# Patient Record
Sex: Female | Born: 1955
Health system: Southern US, Community
[De-identification: ages and names within clinical notes are randomized; demographics above are authoritative.]

## PROBLEM LIST (undated history)

## (undated) DIAGNOSIS — K219 Gastro-esophageal reflux disease without esophagitis: Secondary | ICD-10-CM

## (undated) DIAGNOSIS — M797 Fibromyalgia: Secondary | ICD-10-CM

## (undated) DIAGNOSIS — E119 Type 2 diabetes mellitus without complications: Secondary | ICD-10-CM

## (undated) DIAGNOSIS — M1990 Unspecified osteoarthritis, unspecified site: Secondary | ICD-10-CM

## (undated) DIAGNOSIS — F419 Anxiety disorder, unspecified: Secondary | ICD-10-CM

## (undated) DIAGNOSIS — G43909 Migraine, unspecified, not intractable, without status migrainosus: Secondary | ICD-10-CM

## (undated) HISTORY — PX: TUBAL LIGATION: SHX77

## (undated) HISTORY — PX: GALLBLADDER SURGERY: SHX652

## (undated) HISTORY — DX: Fibromyalgia: M79.7

## (undated) HISTORY — DX: Migraine, unspecified, not intractable, without status migrainosus: G43.909

## (undated) HISTORY — DX: Anxiety disorder, unspecified: F41.9

## (undated) HISTORY — DX: Type 2 diabetes mellitus without complications: E11.9

---

## 2011-12-14 DIAGNOSIS — Z90710 Acquired absence of both cervix and uterus: Secondary | ICD-10-CM | POA: Insufficient documentation

## 2011-12-14 DIAGNOSIS — D126 Benign neoplasm of colon, unspecified: Secondary | ICD-10-CM | POA: Insufficient documentation

## 2011-12-14 HISTORY — DX: Benign neoplasm of colon, unspecified: D12.6

## 2011-12-14 HISTORY — DX: Acquired absence of both cervix and uterus: Z90.710

## 2016-11-26 DIAGNOSIS — R112 Nausea with vomiting, unspecified: Secondary | ICD-10-CM | POA: Diagnosis not present

## 2016-11-26 DIAGNOSIS — H8110 Benign paroxysmal vertigo, unspecified ear: Secondary | ICD-10-CM | POA: Diagnosis not present

## 2017-10-23 ENCOUNTER — Inpatient Hospital Stay (HOSPITAL_COMMUNITY): Payer: BLUE CROSS/BLUE SHIELD

## 2017-10-23 ENCOUNTER — Emergency Department (HOSPITAL_COMMUNITY): Payer: BLUE CROSS/BLUE SHIELD

## 2017-10-23 ENCOUNTER — Inpatient Hospital Stay (HOSPITAL_COMMUNITY)
Admission: EM | Admit: 2017-10-23 | Discharge: 2017-10-25 | DRG: 419 | Disposition: A | Discharge status: Home / Self Care | Payer: BLUE CROSS/BLUE SHIELD | Attending: Internal Medicine | Admitting: Internal Medicine

## 2017-10-23 ENCOUNTER — Encounter (HOSPITAL_COMMUNITY): Payer: Self-pay | Admitting: Emergency Medicine

## 2017-10-23 ENCOUNTER — Other Ambulatory Visit: Payer: Self-pay

## 2017-10-23 DIAGNOSIS — R109 Unspecified abdominal pain: Secondary | ICD-10-CM

## 2017-10-23 DIAGNOSIS — R9431 Abnormal electrocardiogram [ECG] [EKG]: Secondary | ICD-10-CM | POA: Diagnosis not present

## 2017-10-23 DIAGNOSIS — J9811 Atelectasis: Secondary | ICD-10-CM

## 2017-10-23 DIAGNOSIS — F17210 Nicotine dependence, cigarettes, uncomplicated: Secondary | ICD-10-CM | POA: Diagnosis not present

## 2017-10-23 DIAGNOSIS — K810 Acute cholecystitis: Principal | ICD-10-CM

## 2017-10-23 DIAGNOSIS — K819 Cholecystitis, unspecified: Secondary | ICD-10-CM | POA: Diagnosis present

## 2017-10-23 DIAGNOSIS — Z90710 Acquired absence of both cervix and uterus: Secondary | ICD-10-CM

## 2017-10-23 DIAGNOSIS — R1011 Right upper quadrant pain: Secondary | ICD-10-CM | POA: Diagnosis not present

## 2017-10-23 DIAGNOSIS — R748 Abnormal levels of other serum enzymes: Secondary | ICD-10-CM | POA: Diagnosis not present

## 2017-10-23 DIAGNOSIS — F17200 Nicotine dependence, unspecified, uncomplicated: Secondary | ICD-10-CM | POA: Diagnosis present

## 2017-10-23 DIAGNOSIS — K811 Chronic cholecystitis: Secondary | ICD-10-CM | POA: Diagnosis not present

## 2017-10-23 DIAGNOSIS — R932 Abnormal findings on diagnostic imaging of liver and biliary tract: Secondary | ICD-10-CM | POA: Diagnosis not present

## 2017-10-23 DIAGNOSIS — Z79899 Other long term (current) drug therapy: Secondary | ICD-10-CM

## 2017-10-23 DIAGNOSIS — Z978 Presence of other specified devices: Secondary | ICD-10-CM | POA: Diagnosis not present

## 2017-10-23 DIAGNOSIS — Z9049 Acquired absence of other specified parts of digestive tract: Secondary | ICD-10-CM | POA: Diagnosis not present

## 2017-10-23 DIAGNOSIS — K219 Gastro-esophageal reflux disease without esophagitis: Secondary | ICD-10-CM

## 2017-10-23 DIAGNOSIS — R161 Splenomegaly, not elsewhere classified: Secondary | ICD-10-CM | POA: Diagnosis not present

## 2017-10-23 DIAGNOSIS — K828 Other specified diseases of gallbladder: Secondary | ICD-10-CM | POA: Diagnosis not present

## 2017-10-23 DIAGNOSIS — K5730 Diverticulosis of large intestine without perforation or abscess without bleeding: Secondary | ICD-10-CM | POA: Diagnosis not present

## 2017-10-23 HISTORY — DX: Gastro-esophageal reflux disease without esophagitis: K21.9

## 2017-10-23 HISTORY — DX: Cholecystitis, unspecified: K81.9

## 2017-10-23 LAB — CBC WITH DIFFERENTIAL/PLATELET
Abs Immature Granulocytes: 0 10*3/uL (ref 0.0–0.1)
Basophils Absolute: 0.1 10*3/uL (ref 0.0–0.1)
Basophils Relative: 1 %
Eosinophils Absolute: 0.1 10*3/uL (ref 0.0–0.7)
Eosinophils Relative: 2 %
HCT: 46 % (ref 36.0–46.0)
Hemoglobin: 14.9 g/dL (ref 12.0–15.0)
Immature Granulocytes: 0 %
Lymphocytes Relative: 25 %
Lymphs Abs: 1.8 10*3/uL (ref 0.7–4.0)
MCH: 29.6 pg (ref 26.0–34.0)
MCHC: 32.4 g/dL (ref 30.0–36.0)
MCV: 91.5 fL (ref 78.0–100.0)
Monocytes Absolute: 0.6 10*3/uL (ref 0.1–1.0)
Monocytes Relative: 8 %
Neutro Abs: 4.7 10*3/uL (ref 1.7–7.7)
Neutrophils Relative %: 64 %
Platelets: 217 10*3/uL (ref 150–400)
RBC: 5.03 MIL/uL (ref 3.87–5.11)
RDW: 13 % (ref 11.5–15.5)
WBC: 7.3 10*3/uL (ref 4.0–10.5)

## 2017-10-23 LAB — URINALYSIS, ROUTINE W REFLEX MICROSCOPIC
Bilirubin Urine: NEGATIVE
Glucose, UA: NEGATIVE mg/dL
Hgb urine dipstick: NEGATIVE
Ketones, ur: NEGATIVE mg/dL
Leukocytes, UA: NEGATIVE
Nitrite: NEGATIVE
Protein, ur: NEGATIVE mg/dL
Specific Gravity, Urine: 1.02 (ref 1.005–1.030)
pH: 5 (ref 5.0–8.0)

## 2017-10-23 LAB — COMPREHENSIVE METABOLIC PANEL
ALT: 18 U/L (ref 0–44)
AST: 20 U/L (ref 15–41)
Albumin: 3.7 g/dL (ref 3.5–5.0)
Alkaline Phosphatase: 147 U/L — ABNORMAL HIGH (ref 38–126)
Anion gap: 12 (ref 5–15)
BUN: 11 mg/dL (ref 6–20)
CO2: 23 mmol/L (ref 22–32)
Calcium: 9.2 mg/dL (ref 8.9–10.3)
Chloride: 106 mmol/L (ref 98–111)
Creatinine, Ser: 0.91 mg/dL (ref 0.44–1.00)
GFR calc Af Amer: >60 mL/min (ref >60)
GFR calc non Af Amer: >60 mL/min (ref >60)
Glucose, Bld: 142 mg/dL — ABNORMAL HIGH (ref 70–99)
Potassium: 3.7 mmol/L (ref 3.5–5.1)
Sodium: 141 mmol/L (ref 135–145)
Total Bilirubin: 0.8 mg/dL (ref 0.3–1.2)
Total Protein: 7.2 g/dL (ref 6.5–8.1)

## 2017-10-23 LAB — SURGICAL PCR SCREEN
MRSA, PCR: NEGATIVE
Staphylococcus aureus: NEGATIVE

## 2017-10-23 LAB — LIPASE, BLOOD: Lipase: 69 U/L — ABNORMAL HIGH (ref 11–51)

## 2017-10-23 MED ORDER — ONDANSETRON HCL 4 MG PO TABS: 4.0000 mg | ORAL_TABLET | Freq: Four times a day (QID) | ORAL | Status: DC | PRN

## 2017-10-23 MED ORDER — MORPHINE SULFATE (PF) 4 MG/ML IV SOLN
4.0000 mg | Freq: Once | INTRAVENOUS | Status: AC
  Administered 2017-10-23: 4 mg via INTRAVENOUS
  Filled 2017-10-23: qty 1

## 2017-10-23 MED ORDER — SODIUM CHLORIDE 0.9 % IV SOLN
2.0000 g | INTRAVENOUS | Status: DC
  Administered 2017-10-23 – 2017-10-24 (×2): 2 g via INTRAVENOUS
  Filled 2017-10-23 (×3): qty 20

## 2017-10-23 MED ORDER — GADOBUTROL 1 MMOL/ML IV SOLN
9.0000 mL | Freq: Once | INTRAVENOUS | Status: AC | PRN
  Administered 2017-10-23: 9 mL via INTRAVENOUS

## 2017-10-23 MED ORDER — ENOXAPARIN SODIUM 40 MG/0.4ML ~~LOC~~ SOLN
40.0000 mg | SUBCUTANEOUS | Status: DC
  Administered 2017-10-23 – 2017-10-25 (×2): 40 mg via SUBCUTANEOUS
  Filled 2017-10-23 (×2): qty 0.4

## 2017-10-23 MED ORDER — LACTATED RINGERS IV SOLN
INTRAVENOUS | Status: DC
  Administered 2017-10-23 – 2017-10-24 (×3): via INTRAVENOUS

## 2017-10-23 MED ORDER — ACETAMINOPHEN 325 MG PO TABS
650.0000 mg | ORAL_TABLET | Freq: Four times a day (QID) | ORAL | Status: DC | PRN
  Administered 2017-10-24 – 2017-10-25 (×3): 650 mg via ORAL
  Filled 2017-10-23 (×3): qty 2

## 2017-10-23 MED ORDER — MORPHINE SULFATE (PF) 2 MG/ML IV SOLN
2.0000 mg | Freq: Once | INTRAVENOUS | Status: AC
  Administered 2017-10-23: 2 mg via INTRAVENOUS
  Filled 2017-10-23: qty 1

## 2017-10-23 MED ORDER — METRONIDAZOLE IN NACL 5-0.79 MG/ML-% IV SOLN
500.0000 mg | Freq: Three times a day (TID) | INTRAVENOUS | Status: DC
  Administered 2017-10-23 – 2017-10-25 (×6): 500 mg via INTRAVENOUS
  Filled 2017-10-23 (×7): qty 100

## 2017-10-23 MED ORDER — HYDROMORPHONE HCL 1 MG/ML IJ SOLN
0.5000 mg | INTRAMUSCULAR | Status: DC | PRN
  Administered 2017-10-23 – 2017-10-24 (×4): 0.5 mg via INTRAVENOUS
  Filled 2017-10-23 (×4): qty 1

## 2017-10-23 MED ORDER — SODIUM CHLORIDE 0.9 % IV SOLN: 2.0000 g | Freq: Once | INTRAVENOUS | Status: DC

## 2017-10-23 MED ORDER — ACETAMINOPHEN 650 MG RE SUPP: 650.0000 mg | Freq: Four times a day (QID) | RECTAL | Status: DC | PRN

## 2017-10-23 MED ORDER — ONDANSETRON HCL 4 MG/2ML IJ SOLN
4.0000 mg | Freq: Once | INTRAMUSCULAR | Status: AC
  Administered 2017-10-23: 4 mg via INTRAVENOUS
  Filled 2017-10-23: qty 2

## 2017-10-23 MED ORDER — ONDANSETRON HCL 4 MG/2ML IJ SOLN
4.0000 mg | Freq: Four times a day (QID) | INTRAMUSCULAR | Status: DC | PRN
  Administered 2017-10-23: 4 mg via INTRAVENOUS
  Filled 2017-10-23: qty 2

## 2017-10-23 MED ORDER — IOPAMIDOL (ISOVUE-300) INJECTION 61%
100.0000 mL | Freq: Once | INTRAVENOUS | Status: AC | PRN
  Administered 2017-10-23: 100 mL via INTRAVENOUS

## 2017-10-23 MED ORDER — SODIUM CHLORIDE 0.9 % IV BOLUS
1000.0000 mL | Freq: Once | INTRAVENOUS | Status: AC
  Administered 2017-10-23: 1000 mL via INTRAVENOUS

## 2017-10-23 MED ORDER — OXYCODONE HCL 5 MG PO TABS
5.0000 mg | ORAL_TABLET | ORAL | Status: DC | PRN
  Administered 2017-10-23: 5 mg via ORAL
  Filled 2017-10-23: qty 1

## 2017-10-23 NOTE — Progress Notes (Signed)
Transported to Radiology via bed

## 2017-10-23 NOTE — ED Provider Notes (Signed)
[facility] DEPARTMENT Provider Note   CSN: [id number] Arrival date & time: 10/23/17  0243     History   Chief Complaint Chief Complaint  Patient presents with  . Abdominal Pain    HPI Heidi Chapman is a 62 y.o. female.  The history is provided by the patient.  She has no significant past history and noted onset about 2 hours ago severe right upper abdominal pain which is radiating throughout her abdomen but not into her back.  There is associated nausea without vomiting.  She had eaten dinner of meat loaf several hours before pain started.  She is never had pain like this before.  She rates pain at 8/10.  It is worse when she bends over, nothing makes it better.  Past Medical History:  Diagnosis Date  . GERD (gastroesophageal reflux disease)     There are no active problems to display for this patient.   Past Surgical History:  Procedure Laterality Date  . CESAREAN SECTION       OB History   None      Home Medications    Prior to Admission medications   Not on File    Family History No family history on file.  Social History Social History   Tobacco Use  . Smoking status: Current Every Day Smoker  . Smokeless tobacco: Never Used  Substance Use Topics  . Alcohol use: Never    Frequency: Never  . Drug use: Never     Allergies   Patient has no allergy information on record.   Review of Systems Review of Systems  All other systems reviewed and are negative.    Physical Exam Updated Vital Signs BP (!) 140/116 (BP Location: Right Arm)   Pulse 100   Temp 98.6 F (37 C) (Oral)   Resp (!) 22   SpO2 97%   Physical Exam  Nursing note and vitals reviewed.  62 year old female, appears uncomfortable and in pain, but is in no acute distress. Vital signs are significant for elevated blood pressure and rapid respiratory rate. Oxygen saturation is 97%, which is normal. Head is normocephalic and atraumatic. PERRLA, EOMI.  Oropharynx is clear. Neck is nontender and supple without adenopathy or JVD. Back is nontender and there is no CVA tenderness. Lungs are clear without rales, wheezes, or rhonchi. Chest is nontender. Heart has regular rate and rhythm without murmur. Abdomen is soft, flat, with diffuse tenderness.  Tenderness is clearly maximal in the right upper quadrant and subcostal region.  She is quite tachypneic and I am unable to assess for [facility] sign.  There is no rebound or guarding.  There are no masses or hepatosplenomegaly and peristalsis is hypoactive. Extremities have no cyanosis or edema, full range of motion is present. Skin is warm and dry without rash. Neurologic: Mental status is normal, cranial nerves are intact, there are no motor or sensory deficits.  ED Treatments / Results  Labs (all labs ordered are listed, but only abnormal results are displayed) Labs Reviewed  LIPASE, BLOOD - Abnormal; Notable for the following components:      Result Value   Lipase 69 (*)    All other components within normal limits  COMPREHENSIVE METABOLIC PANEL - Abnormal; Notable for the following components:   Glucose, Bld 142 (*)    Alkaline Phosphatase 147 (*)    All other components within normal limits  URINALYSIS, ROUTINE W REFLEX MICROSCOPIC - Abnormal; Notable for the following components:  APPearance HAZY (*)    All other components within normal limits  CULTURE, BLOOD (ROUTINE X 2)  CULTURE, BLOOD (ROUTINE X 2)  CBC WITH DIFFERENTIAL/PLATELET  POC OCCULT BLOOD, ED    EKG EKG Interpretation  Date/Time:  Saturday October 23 2017 02:47:14 EDT Ventricular Rate:  99 PR Interval:  144 QRS Duration: 76 QT Interval:  346 QTC Calculation: 444 R Axis:   80 Text Interpretation:  Normal sinus rhythm Nonspecific ST and T wave abnormality Abnormal ECG No old tracing to compare Confirmed by Delora Fuel ([id number]) on 10/23/2017 3:19:27 AM   Radiology US Abdomen Complete  Result Date:  10/23/2017 CLINICAL DATA:  Abdominal pain, shortness of breath, and nausea and vomiting beginning tonight. EXAM: ABDOMEN ULTRASOUND COMPLETE COMPARISON:  None. FINDINGS: Gallbladder: The gallbladder is contracted, limiting visualization. Gallbladder contraction may be physiologic in the nonfasting state. No stones are identified. Murphy's sign is negative. Common bile duct: Diameter: Up to 11 mm, dilated. No stones or filling defects visualized. Liver: Mildly increased parenchymal echotexture suggesting diffuse fatty infiltration. No focal lesions identified. Portal vein is patent on color Doppler imaging with normal direction of blood flow towards the liver. IVC: No abnormality visualized. Pancreas: Limited visualization due to bowel gas. Borderline pancreatic ductal dilatation. Spleen: Mild splenic enlargement. Spleen length measures 10.2 cm. Splenic volume calculates to 169 mL. A small splenule is also identified. Right Kidney: Length: 9.4 cm. Echogenicity within normal limits. No mass or hydronephrosis visualized. Left Kidney: Length: 10.6 cm. Echogenicity within normal limits. No mass or hydronephrosis visualized. Abdominal aorta: No aneurysm visualized. Other findings: None. IMPRESSION: 1. Contracted gallbladder, likely physiologic. No stones. No evidence of cholecystitis. 2. Dilated extrahepatic bile ducts. Cause is not determined. Correlate with liver function tests. Could consider MRCP for further evaluation. 3. Borderline pancreatic ductal dilatation. 4. Mild splenic enlargement. Electronically Signed   By: Lucienne Capers M.D.   On: 10/23/2017 04:44    Procedures Procedures   Medications Ordered in ED Medications  sodium chloride 0.9 % bolus 1,000 mL (0 mLs Intravenous Stopped 10/23/17 0453)  morphine 4 MG/ML injection 4 mg (4 mg Intravenous Given 10/23/17 0339)  ondansetron (ZOFRAN) injection 4 mg (4 mg Intravenous Given 10/23/17 0340)  morphine 4 MG/ML injection 4 mg (4 mg Intravenous Given 10/23/17  0519)     Initial Impression / Assessment and Plan / ED Course  I have reviewed the triage vital signs and the nursing notes.  Pertinent labs & imaging results that were available during my care of the patient were reviewed by me and considered in my medical decision making (see chart for details).  Abdominal pain which is suspicious for biliary colic.  Consider pancreatitis, peptic ulcer disease, diverticulitis.  She has no prior records in the [facility] health system.  She will be given IV fluids, morphine, ondansetron.  She will be sent for abdominal ultrasound.  5:28 AM Ultrasound shows no evidence of gallstones, mildly related intrahepatic and questionable dilated pancreatic ducts.  Alkaline phosphatase is mildly elevated, but transaminases are normal.  Lipase is slightly elevated.  She had good relief of pain with morphine, but pain recurred and required a second dose.  She will be sent for CT of abdomen and pelvis.  7:36 AM CT shows pericholecystic fluid suggestive of cholecystitis.  Also, consolidation of both lower lobes which could be atelectasis versus pneumonia.  Clinically, she does not have pneumonia and I suspect this is related to shallow breathing because of her abdominal pain.  She did require  an additional dose of morphine.  She clearly needs to be admitted for pain control.  She is given a dose of ceftriaxone which would treat both cholecystitis and pneumonia.  Case is discussed with Dr. Trilby Drummer of internal medicine teaching service who agrees to admit the patient, also discussed with Romana Juniper of general surgery service who agrees to see the patient in consultation, recommends obtaining MRCP.  Final Clinical Impressions(s) / ED Diagnoses   Final diagnoses:  Abdominal pain, unspecified abdominal location  Alkaline phosphatase elevation  Elevated lipase    ED Discharge Orders    None       Delora Fuel, MD 22/24/11 [id number]

## 2017-10-23 NOTE — H&P (View-Only) (Signed)
[facility] Consult/Admission Note  Heidi Chapman Dec 03, 1955  [id number].    Requesting MD Dr. Roxanne Mins Chief Complaint/Reason for Consult: RUQ abdominal pain  HPI:   Pt is a 62 yo female with a history of GERD who presented to the ED with complaints of RUQ abdominal pain. Pt states she has been having intermittent RUQ abd pain for roughly 5-6 months. Worse after eating. Pain last night was worst episode and woke her from her sleep. She states the pain as severe, non radiating, and constant. Dark stool last 2 days. Nausea, SOB, chills, subjective fevers with onset of pain last night. No vomiting no CP. No urinary symptoms. Pt has had a C section and an abdominal hysterectomy. She is not anticoagulated.   ROS:  Review of Systems  Constitutional: Positive for chills and fever (subjective ). Negative for diaphoresis.  HENT: Negative for sore throat.   Respiratory: Positive for shortness of breath (with onset of pain, non currently). Negative for cough.   Cardiovascular: Negative for chest pain.  Gastrointestinal: Positive for abdominal pain and nausea. Negative for blood in stool, constipation, diarrhea and vomiting.  Genitourinary: Negative for dysuria, frequency, hematuria and urgency.  Skin: Negative for rash.  Neurological: Negative for dizziness and loss of consciousness.  All other systems reviewed and are negative.    No family history on file.  Past Medical History:  Diagnosis Date  . GERD (gastroesophageal reflux disease)     Past Surgical History:  Procedure Laterality Date  . CESAREAN SECTION      Social History:  reports that she has been smoking. She has never used smokeless tobacco. She reports that she does not drink alcohol or use drugs.  Allergies: No Known Allergies  Medications Prior to Admission  Medication Sig Dispense Refill  . bismuth subsalicylate (PEPTO BISMOL) 262 MG chewable tablet Chew 524 mg by mouth every evening.    . cimetidine  (TAGAMET) 200 MG tablet Take 200 mg by mouth 2 (two) times daily.      Blood pressure 110/74, pulse 91, temperature 98.3 F (36.8 C), temperature source Oral, resp. rate 17, height 5' 5"  (1.651 m), weight 96.1 kg, SpO2 95 %.  Physical Exam  Constitutional: She is oriented to person, place, and time. She appears well-developed and well-nourished.  Non-toxic appearance. She does not appear ill. No distress.  HENT:  Head: Normocephalic and atraumatic.  Nose: Nose normal.  Mouth/Throat: Uvula is midline, oropharynx is clear and moist and mucous membranes are normal. No oropharyngeal exudate.  Eyes: Pupils are equal, round, and reactive to light. Conjunctivae are normal. Right eye exhibits no discharge. Left eye exhibits no discharge. No scleral icterus.  Neck: Normal range of motion. Neck supple. No thyromegaly present.  Cardiovascular: Normal rate, regular rhythm, normal heart sounds and intact distal pulses.  No murmur heard. Pulses:      Radial pulses are 2+ on the right side, and 2+ on the left side.       Dorsalis pedis pulses are 2+ on the right side, and 2+ on the left side.  Pulmonary/Chest: Effort normal and breath sounds normal. No respiratory distress. She has no wheezes. She has no rhonchi. She has no rales.  Abdominal: Soft. Normal appearance and bowel sounds are normal. She exhibits no distension. There is no hepatosplenomegaly. There is tenderness in the right upper quadrant. There is positive Murphy's sign. There is no rigidity and no guarding.  Musculoskeletal: Normal range of motion. She exhibits no edema, tenderness or deformity.  Lymphadenopathy:    She has no cervical adenopathy.  Neurological: She is alert and oriented to person, place, and time.  Skin: Skin is warm and dry. No rash noted. She is not diaphoretic.  Psychiatric: She has a normal mood and affect.  Nursing note and vitals reviewed.   Results for orders placed or performed during the hospital encounter of  10/23/17 (from the past 48 hour(s))  Urinalysis, Routine w reflex microscopic     Status: Abnormal   Collection Time: 10/23/17  2:56 AM  Result Value Ref Range   Color, Urine YELLOW YELLOW   APPearance HAZY (A) CLEAR   Specific Gravity, Urine 1.020 1.005 - 1.030   pH 5.0 5.0 - 8.0   Glucose, UA NEGATIVE NEGATIVE mg/dL   Hgb urine dipstick NEGATIVE NEGATIVE   Bilirubin Urine NEGATIVE NEGATIVE   Ketones, ur NEGATIVE NEGATIVE mg/dL   Protein, ur NEGATIVE NEGATIVE mg/dL   Nitrite NEGATIVE NEGATIVE   Leukocytes, UA NEGATIVE NEGATIVE    Comment: Performed at [facility] [address], [place], [place] [address]  Lipase, blood     Status: Abnormal   Collection Time: 10/23/17  2:58 AM  Result Value Ref Range   Lipase 69 (H) 11 - 51 U/L    Comment: Performed at [facility] [address], [place], [place] [address]  Comprehensive metabolic panel     Status: Abnormal   Collection Time: 10/23/17  2:58 AM  Result Value Ref Range   Sodium 141 135 - 145 mmol/L   Potassium 3.7 3.5 - 5.1 mmol/L   Chloride 106 98 - 111 mmol/L   CO2 23 22 - 32 mmol/L   Glucose, Bld 142 (H) 70 - 99 mg/dL   BUN 11 6 - 20 mg/dL   Creatinine, Ser 0.91 0.44 - 1.00 mg/dL   Calcium 9.2 8.9 - 10.3 mg/dL   Total Protein 7.2 6.5 - 8.1 g/dL   Albumin 3.7 3.5 - 5.0 g/dL   AST 20 15 - 41 U/L   ALT 18 0 - 44 U/L   Alkaline Phosphatase 147 (H) 38 - 126 U/L   Total Bilirubin 0.8 0.3 - 1.2 mg/dL   GFR calc non Af Amer >60 >60 mL/min   GFR calc Af Amer >60 >60 mL/min    Comment: (NOTE) The eGFR has been calculated using the CKD EPI equation. This calculation has not been validated in all clinical situations. eGFR's persistently <60 mL/min signify possible Chronic Kidney Disease.    Anion gap 12 5 - 15    Comment: Performed at [facility] [address], [place], [place] [address]  CBC with Differential     Status: None   Collection Time: 10/23/17  3:21 AM  Result Value Ref Range   WBC 7.3 4.0  - 10.5 K/uL   RBC 5.03 3.87 - 5.11 MIL/uL   Hemoglobin 14.9 12.0 - 15.0 g/dL   HCT 46.0 36.0 - 46.0 %   MCV 91.5 78.0 - 100.0 fL   MCH 29.6 26.0 - 34.0 pg   MCHC 32.4 30.0 - 36.0 g/dL   RDW 13.0 11.5 - 15.5 %   Platelets 217 150 - 400 K/uL   Neutrophils Relative % 64 %   Neutro Abs 4.7 1.7 - 7.7 K/uL   Lymphocytes Relative 25 %   Lymphs Abs 1.8 0.7 - 4.0 K/uL   Monocytes Relative 8 %   Monocytes Absolute 0.6 0.1 - 1.0 K/uL   Eosinophils Relative  2 %   Eosinophils Absolute 0.1 0.0 - 0.7 K/uL   Basophils Relative 1 %   Basophils Absolute 0.1 0.0 - 0.1 K/uL   Immature Granulocytes 0 %   Abs Immature Granulocytes 0.0 0.0 - 0.1 K/uL    Comment: Performed at [facility] [address] [place], [place] [address]   US Abdomen Complete  Result Date: 10/23/2017 CLINICAL DATA:  Abdominal pain, shortness of breath, and nausea and vomiting beginning tonight. EXAM: ABDOMEN ULTRASOUND COMPLETE COMPARISON:  None. FINDINGS: Gallbladder: The gallbladder is contracted, limiting visualization. Gallbladder contraction may be physiologic in the nonfasting state. No stones are identified. Murphy's sign is negative. Common bile duct: Diameter: Up to 11 mm, dilated. No stones or filling defects visualized. Liver: Mildly increased parenchymal echotexture suggesting diffuse fatty infiltration. No focal lesions identified. Portal vein is patent on color Doppler imaging with normal direction of blood flow towards the liver. IVC: No abnormality visualized. Pancreas: Limited visualization due to bowel gas. Borderline pancreatic ductal dilatation. Spleen: Mild splenic enlargement. Spleen length measures 10.2 cm. Splenic volume calculates to 169 mL. A small splenule is also identified. Right Kidney: Length: 9.4 cm. Echogenicity within normal limits. No mass or hydronephrosis visualized. Left Kidney: Length: 10.6 cm. Echogenicity within normal limits. No mass or hydronephrosis visualized. Abdominal aorta: No aneurysm  visualized. Other findings: None. IMPRESSION: 1. Contracted gallbladder, likely physiologic. No stones. No evidence of cholecystitis. 2. Dilated extrahepatic bile ducts. Cause is not determined. Correlate with liver function tests. Could consider MRCP for further evaluation. 3. Borderline pancreatic ductal dilatation. 4. Mild splenic enlargement. Electronically Signed   By: Lucienne Capers M.D.   On: 10/23/2017 04:44   Ct Abdomen Pelvis W Contrast  Result Date: 10/23/2017 CLINICAL DATA:  Right-sided abdominal pain woke patient up at 1 a.m. EXAM: CT ABDOMEN AND PELVIS WITH CONTRAST TECHNIQUE: Multidetector CT imaging of the abdomen and pelvis was performed using the standard protocol following bolus administration of intravenous contrast. CONTRAST:  135m ISOVUE-300 IOPAMIDOL (ISOVUE-300) INJECTION 61% COMPARISON:  Ultrasound abdomen quadrant 10/23/2017 FINDINGS: Lower chest: Consolidation and atelectasis in the lung bases. This may represent pneumonia. Small esophageal hiatal hernia. Hepatobiliary: Gallbladder wall is thickened with pericholecystic edema and infiltration in the surrounding fat suggesting cholecystitis. No stones are identified. Extrahepatic bile duct dilatation. No obstructing stones identified. No focal liver lesions. Pancreas: Mild pancreatic ductal dilatation throughout. Pancreatic parenchyma is homogeneous. No inflammatory stranding. Spleen: Normal in size without focal abnormality. Adrenals/Urinary Tract: Adrenal glands are unremarkable. Kidneys are normal, without renal calculi, focal lesion, or hydronephrosis. Bladder is unremarkable. Stomach/Bowel: Stomach, small bowel, and colon are mostly decompressed. Diverticula arising from the second and third portion of the duodenum. Scattered stool in the colon. Appendix is normal. Vascular/Lymphatic: Aortic atherosclerosis. No enlarged abdominal or pelvic lymph nodes. Reproductive: Status post hysterectomy. No adnexal masses. Other: Surgical  clips along the midline anterior abdominal wall. No free air or free fluid in the abdomen. Musculoskeletal: Degenerative changes in the spine. No destructive bone lesions. IMPRESSION: 1. Consolidation and atelectasis in the lung bases may represent pneumonia. 2. Gallbladder wall thickening with pericholecystic edema and infiltration in the surrounding fat consistent with acute cholecystitis. No stones identified. 3. Extrahepatic bile duct dilatation. Mild pancreatic ductal dilatation. No cause identified. No evidence of acute pancreatitis. 4. Duodenal diverticula. Aortic Atherosclerosis (ICD10-I70.0). Electronically Signed   By: WLucienne CapersM.D.   On: 10/23/2017 06:53      Assessment/Plan Active Problems:   Cholecystitis  RUQ pain - [place]  not concerning for acute cholecystitis, no cholelithiasis, CBD dilated up to 26m - elevated lipase and Alk phos concerning for passed or retained CBD stone - MRCP pending   FEN: NPO VTE: SCD's, lovenox ID: Rocephin and Flagyl 09/07>> Foley:none Follow up: TBD  Plan: MRCP to evaluate CBD, we will follow   JKalman Drape [facility] 10/23/2017, 9:41 AM Pager: [phone]: [phone]-Fri 7:00 am-4:30 pm Sat-Sun 7:00 am-11:30 am

## 2017-10-23 NOTE — Plan of Care (Signed)
  Problem: Clinical Measurements: Goal: Ability to maintain clinical measurements within normal limits will improve Outcome: Progressing   Problem: Activity: Goal: Risk for activity intolerance will decrease Outcome: Progressing   Problem: Pain Managment: Goal: General experience of comfort will improve Outcome: Progressing   

## 2017-10-23 NOTE — ED Notes (Signed)
Patient transported to Ultrasound 

## 2017-10-23 NOTE — ED Triage Notes (Addendum)
Woke up with severe sharp RUQ pain and nausea 1 hour ago.  Denies vomiting, diarrhea, or constipation.  Reports black stools for the past 2 days.

## 2017-10-23 NOTE — Progress Notes (Signed)
Admit from ED. Alert,oriented

## 2017-10-23 NOTE — ED Notes (Signed)
Admitting team at bedside.

## 2017-10-23 NOTE — H&P (Signed)
Date: 10/23/2017               Patient Name:  Heidi Chapman MRN: [id number]  DOB: 02/20/1955 Age / Sex: 62 y.o., female   PCP: Patient, No Pcp Per         Medical Service: Internal Medicine Teaching Service         Attending Physician: Dr. Sid Falcon, MD    First Contact: Dr. Eileen Stanford Pager: [phone]  Second Contact: Dr. Trilby Drummer Pager: [phone]       After Hours (After 5p/  First Contact Pager: [phone]  weekends / holidays): Second Contact Pager: [phone]   Chief Complaint: Abdominal pain  History of Present Illness: Heidi Chapman is a 62 year old woman with medical history of GERD, no significant past medical history presenting with acute onset right upper quadrant pain.  She was in her usual state of health when she woke up at 1am with really bad pain on her right side. The pain became progressively worse. The pain is located in her RUQ and started as a 5-6/10 which awoke her from sleep. By the time she got to the emergency department, the pain was an 8/10. Worse with inspiration, or bending over. She had dinner at 6 or 7 pm the night before. She did endorse subjective fevers, chills, nausea and SOB especially when the pain was severe but no vomiting, diaphoresis or chest pain. She denies any history of similar pain.Pain only relieved by pain medication she received in the ED (morphine).   In addition, she reports of a 5-6 months of intermittent pain in her RUQ that is different from her usual GERD and reports of taking prilosec in the past with no obvious change. She currently takes Pepto-bismol and tagamet for this.  ED Course: Initial BP of 140/116 which normalized with no intervention, Afebrile, HR 91, O2 saturation of 95% on RA. CBC unremarkable, CMP noticeable of elevated Alk phos, Lipase elevated at 69, Abdominal U/S revealed no evidence of cholecystitis, but did show dilated extrahepatic bile ducts, borderline pancreatic ductal dilation, mild splenic enlargement, CBD dilation of  41m. Follow up CT abdomen showed Gallbladder wall thickening with pericholecystic edema and infiltration in the surrounding fat consistent with acute cholecystitis, no stones identified, Extrahepatic bile duct dilatation, mild pancreatic ductal dilatation, consolidation and atelectasis in the lung bases may represent pneumonia. General surgery and Internal Medicine were consulted for further evaluation.    Meds:  Current Meds  Medication Sig  . bismuth subsalicylate (PEPTO BISMOL) 262 MG chewable tablet Chew 524 mg by mouth every evening.  . cimetidine (TAGAMET) 200 MG tablet Take 200 mg by mouth 2 (two) times daily.     Allergies: Allergies as of 10/23/2017  . (No Known Allergies)   Past Medical History:  Diagnosis Date  . GERD (gastroesophageal reflux disease)     Family History: Non contributory but mother and father with diabetes Mellitus   Social History:  -Occasional EtOH use, last use 1 year ago  -Current cigarette use. Half pack a day since teenage years.   Surgical Hx:  -C-section 862-Total hysterectomy 274yrgo  Review of Systems: A complete ROS was negative except as per HPI.   Physical Exam: Blood pressure 110/74, pulse 91, temperature 98.3 F (36.8 C), temperature source Oral, resp. rate 17, height _0  (1.651 m), weight 96.1 kg, SpO2 95 %.  Physical Exam  Constitutional: She appears distressed (Mildly distressed expecially with palpation of abdomen).  HENT:  Head: Normocephalic and atraumatic.  Eyes: No scleral icterus.  Cardiovascular: Normal rate, regular rhythm and normal heart sounds. Exam reveals no gallop and no friction rub.  No murmur heard. Pulmonary/Chest: Breath sounds normal. No accessory muscle usage. No respiratory distress.  Abdominal: Soft. Bowel sounds are normal. She exhibits no distension. There is tenderness (Diffusely tender to palpation but worse at the RUQ, + Murphy sign). There is no rebound and no guarding.  Neurological: She is  alert.  Skin: Skin is warm. No rash noted.  Psychiatric: Mood and affect normal.     EKG: personally reviewed my interpretation is normal sinus.   Assessment & Plan by Problem: Active Problems:   Cholecystitis  Heidi Chapman is a 62 year old woman with medical history of GERD, no significant past medical history presenting with acute onset right upper quadrant pain with image finding consistent with acute acalculous cholecystitis.   Acute acalculous cholecystitis: Patient with acute onset RUQ pain with radiation to the mid abdomen and shoulders, unrelated to diet, subjective fevers, chills and nausea. Afebrile in the ED, CBC negative for leukocytosis, CMP with elevated alk phos, mildly elevated lipase. Abdominal ultrasound reveal  no evidence of cholecystitis, borderline pancreatic ductal dilation, mild splenic enlargement, CBD dilation of 12m. CT abdomen and pelvis shows acute cholecystitis, no stones identified. Physical exam with normal bowel sounds, Positive Murphy sign, RUQ tenderness.  -Appreciate general surgery reccs:  *MRCP -Pain regimen: Dilaudid 0.541mq2 hrs prn severe pain, Tylenol 65057mZofran prn nausea -Ceftriaxone 2g qdaily and Flagyl 500m37m hours  -Pending blood cultures   Atelectasis vs pneumonia: CT Abdomen and pelvis revealed consolidation and atelectasis in the lung bases may represent pneumonia- however less likely as she denies any recent cough and sputum production. Also, given that consolidation is bilateral, it is most likely secondary to decreased respiratory effort due to ongoing pain.    FEN: s/p NS bolus, LR @ 125mL59m replace electrolytes as needed, NPO VTE ppx: Lovenox 40 SQ [facility] status: Full Code  Dispo: Admit patient to inpatient with expected length of stay greater than 2 days.   Signed: AgyeiJean Rosenthal9/08/2017, 10:32 AM  Pager: [phone] PGY-1

## 2017-10-23 NOTE — Consult Note (Signed)
[facility] Consult/Admission Note  Heidi Chapman 12-26-1955  [id number].    Requesting MD Dr. Roxanne Mins Chief Complaint/Reason for Consult: RUQ abdominal pain  HPI:   Pt is a 63 yo female with a history of GERD who presented to the ED with complaints of RUQ abdominal pain. Pt states she has been having intermittent RUQ abd pain for roughly 5-6 months. Worse after eating. Pain last night was worst episode and woke her from her sleep. She states the pain as severe, non radiating, and constant. Dark stool last 2 days. Nausea, SOB, chills, subjective fevers with onset of pain last night. No vomiting no CP. No urinary symptoms. Pt has had a C section and an abdominal hysterectomy. She is not anticoagulated.   ROS:  Review of Systems  Constitutional: Positive for chills and fever (subjective ). Negative for diaphoresis.  HENT: Negative for sore throat.   Respiratory: Positive for shortness of breath (with onset of pain, non currently). Negative for cough.   Cardiovascular: Negative for chest pain.  Gastrointestinal: Positive for abdominal pain and nausea. Negative for blood in stool, constipation, diarrhea and vomiting.  Genitourinary: Negative for dysuria, frequency, hematuria and urgency.  Skin: Negative for rash.  Neurological: Negative for dizziness and loss of consciousness.  All other systems reviewed and are negative.    No family history on file.  Past Medical History:  Diagnosis Date  . GERD (gastroesophageal reflux disease)     Past Surgical History:  Procedure Laterality Date  . CESAREAN SECTION      Social History:  reports that she has been smoking. She has never used smokeless tobacco. She reports that she does not drink alcohol or use drugs.  Allergies: No Known Allergies  Medications Prior to Admission  Medication Sig Dispense Refill  . bismuth subsalicylate (PEPTO BISMOL) 262 MG chewable tablet Chew 524 mg by mouth every evening.    . cimetidine  (TAGAMET) 200 MG tablet Take 200 mg by mouth 2 (two) times daily.      Blood pressure 110/74, pulse 91, temperature 98.3 F (36.8 C), temperature source Oral, resp. rate 17, height 5' 5"  (1.651 m), weight 96.1 kg, SpO2 95 %.  Physical Exam  Constitutional: She is oriented to person, place, and time. She appears well-developed and well-nourished.  Non-toxic appearance. She does not appear ill. No distress.  HENT:  Head: Normocephalic and atraumatic.  Nose: Nose normal.  Mouth/Throat: Uvula is midline, oropharynx is clear and moist and mucous membranes are normal. No oropharyngeal exudate.  Eyes: Pupils are equal, round, and reactive to light. Conjunctivae are normal. Right eye exhibits no discharge. Left eye exhibits no discharge. No scleral icterus.  Neck: Normal range of motion. Neck supple. No thyromegaly present.  Cardiovascular: Normal rate, regular rhythm, normal heart sounds and intact distal pulses.  No murmur heard. Pulses:      Radial pulses are 2+ on the right side, and 2+ on the left side.       Dorsalis pedis pulses are 2+ on the right side, and 2+ on the left side.  Pulmonary/Chest: Effort normal and breath sounds normal. No respiratory distress. She has no wheezes. She has no rhonchi. She has no rales.  Abdominal: Soft. Normal appearance and bowel sounds are normal. She exhibits no distension. There is no hepatosplenomegaly. There is tenderness in the right upper quadrant. There is positive Murphy's sign. There is no rigidity and no guarding.  Musculoskeletal: Normal range of motion. She exhibits no edema, tenderness or deformity.  Lymphadenopathy:    She has no cervical adenopathy.  Neurological: She is alert and oriented to person, place, and time.  Skin: Skin is warm and dry. No rash noted. She is not diaphoretic.  Psychiatric: She has a normal mood and affect.  Nursing note and vitals reviewed.   Results for orders placed or performed during the hospital encounter of  10/23/17 (from the past 48 hour(s))  Urinalysis, Routine w reflex microscopic     Status: Abnormal   Collection Time: 10/23/17  2:56 AM  Result Value Ref Range   Color, Urine YELLOW YELLOW   APPearance HAZY (A) CLEAR   Specific Gravity, Urine 1.020 1.005 - 1.030   pH 5.0 5.0 - 8.0   Glucose, UA NEGATIVE NEGATIVE mg/dL   Hgb urine dipstick NEGATIVE NEGATIVE   Bilirubin Urine NEGATIVE NEGATIVE   Ketones, ur NEGATIVE NEGATIVE mg/dL   Protein, ur NEGATIVE NEGATIVE mg/dL   Nitrite NEGATIVE NEGATIVE   Leukocytes, UA NEGATIVE NEGATIVE    Comment: Performed at [facility] [address], [place], [place] [address]  Lipase, blood     Status: Abnormal   Collection Time: 10/23/17  2:58 AM  Result Value Ref Range   Lipase 69 (H) 11 - 51 U/L    Comment: Performed at [facility] [address], [place], [place] [address]  Comprehensive metabolic panel     Status: Abnormal   Collection Time: 10/23/17  2:58 AM  Result Value Ref Range   Sodium 141 135 - 145 mmol/L   Potassium 3.7 3.5 - 5.1 mmol/L   Chloride 106 98 - 111 mmol/L   CO2 23 22 - 32 mmol/L   Glucose, Bld 142 (H) 70 - 99 mg/dL   BUN 11 6 - 20 mg/dL   Creatinine, Ser 0.91 0.44 - 1.00 mg/dL   Calcium 9.2 8.9 - 10.3 mg/dL   Total Protein 7.2 6.5 - 8.1 g/dL   Albumin 3.7 3.5 - 5.0 g/dL   AST 20 15 - 41 U/L   ALT 18 0 - 44 U/L   Alkaline Phosphatase 147 (H) 38 - 126 U/L   Total Bilirubin 0.8 0.3 - 1.2 mg/dL   GFR calc non Af Amer >60 >60 mL/min   GFR calc Af Amer >60 >60 mL/min    Comment: (NOTE) The eGFR has been calculated using the CKD EPI equation. This calculation has not been validated in all clinical situations. eGFR's persistently <60 mL/min signify possible Chronic Kidney Disease.    Anion gap 12 5 - 15    Comment: Performed at [facility] [address] [place], [place] [address]  CBC with Differential     Status: None   Collection Time: 10/23/17  3:21 AM  Result Value Ref Range   WBC 7.3 4.0  - 10.5 K/uL   RBC 5.03 3.87 - 5.11 MIL/uL   Hemoglobin 14.9 12.0 - 15.0 g/dL   HCT 46.0 36.0 - 46.0 %   MCV 91.5 78.0 - 100.0 fL   MCH 29.6 26.0 - 34.0 pg   MCHC 32.4 30.0 - 36.0 g/dL   RDW 13.0 11.5 - 15.5 %   Platelets 217 150 - 400 K/uL   Neutrophils Relative % 64 %   Neutro Abs 4.7 1.7 - 7.7 K/uL   Lymphocytes Relative 25 %   Lymphs Abs 1.8 0.7 - 4.0 K/uL   Monocytes Relative 8 %   Monocytes Absolute 0.6 0.1 - 1.0 K/uL   Eosinophils Relative  2 %   Eosinophils Absolute 0.1 0.0 - 0.7 K/uL   Basophils Relative 1 %   Basophils Absolute 0.1 0.0 - 0.1 K/uL   Immature Granulocytes 0 %   Abs Immature Granulocytes 0.0 0.0 - 0.1 K/uL    Comment: Performed at [facility] [address] [place], [place] [address]   US Abdomen Complete  Result Date: 10/23/2017 CLINICAL DATA:  Abdominal pain, shortness of breath, and nausea and vomiting beginning tonight. EXAM: ABDOMEN ULTRASOUND COMPLETE COMPARISON:  None. FINDINGS: Gallbladder: The gallbladder is contracted, limiting visualization. Gallbladder contraction may be physiologic in the nonfasting state. No stones are identified. Murphy's sign is negative. Common bile duct: Diameter: Up to 11 mm, dilated. No stones or filling defects visualized. Liver: Mildly increased parenchymal echotexture suggesting diffuse fatty infiltration. No focal lesions identified. Portal vein is patent on color Doppler imaging with normal direction of blood flow towards the liver. IVC: No abnormality visualized. Pancreas: Limited visualization due to bowel gas. Borderline pancreatic ductal dilatation. Spleen: Mild splenic enlargement. Spleen length measures 10.2 cm. Splenic volume calculates to 169 mL. A small splenule is also identified. Right Kidney: Length: 9.4 cm. Echogenicity within normal limits. No mass or hydronephrosis visualized. Left Kidney: Length: 10.6 cm. Echogenicity within normal limits. No mass or hydronephrosis visualized. Abdominal aorta: No aneurysm  visualized. Other findings: None. IMPRESSION: 1. Contracted gallbladder, likely physiologic. No stones. No evidence of cholecystitis. 2. Dilated extrahepatic bile ducts. Cause is not determined. Correlate with liver function tests. Could consider MRCP for further evaluation. 3. Borderline pancreatic ductal dilatation. 4. Mild splenic enlargement. Electronically Signed   By: Lucienne Capers M.D.   On: 10/23/2017 04:44   Ct Abdomen Pelvis W Contrast  Result Date: 10/23/2017 CLINICAL DATA:  Right-sided abdominal pain woke patient up at 1 a.m. EXAM: CT ABDOMEN AND PELVIS WITH CONTRAST TECHNIQUE: Multidetector CT imaging of the abdomen and pelvis was performed using the standard protocol following bolus administration of intravenous contrast. CONTRAST:  126m ISOVUE-300 IOPAMIDOL (ISOVUE-300) INJECTION 61% COMPARISON:  Ultrasound abdomen quadrant 10/23/2017 FINDINGS: Lower chest: Consolidation and atelectasis in the lung bases. This may represent pneumonia. Small esophageal hiatal hernia. Hepatobiliary: Gallbladder wall is thickened with pericholecystic edema and infiltration in the surrounding fat suggesting cholecystitis. No stones are identified. Extrahepatic bile duct dilatation. No obstructing stones identified. No focal liver lesions. Pancreas: Mild pancreatic ductal dilatation throughout. Pancreatic parenchyma is homogeneous. No inflammatory stranding. Spleen: Normal in size without focal abnormality. Adrenals/Urinary Tract: Adrenal glands are unremarkable. Kidneys are normal, without renal calculi, focal lesion, or hydronephrosis. Bladder is unremarkable. Stomach/Bowel: Stomach, small bowel, and colon are mostly decompressed. Diverticula arising from the second and third portion of the duodenum. Scattered stool in the colon. Appendix is normal. Vascular/Lymphatic: Aortic atherosclerosis. No enlarged abdominal or pelvic lymph nodes. Reproductive: Status post hysterectomy. No adnexal masses. Other: Surgical  clips along the midline anterior abdominal wall. No free air or free fluid in the abdomen. Musculoskeletal: Degenerative changes in the spine. No destructive bone lesions. IMPRESSION: 1. Consolidation and atelectasis in the lung bases may represent pneumonia. 2. Gallbladder wall thickening with pericholecystic edema and infiltration in the surrounding fat consistent with acute cholecystitis. No stones identified. 3. Extrahepatic bile duct dilatation. Mild pancreatic ductal dilatation. No cause identified. No evidence of acute pancreatitis. 4. Duodenal diverticula. Aortic Atherosclerosis (ICD10-I70.0). Electronically Signed   By: WLucienne CapersM.D.   On: 10/23/2017 06:53      Assessment/Plan Active Problems:   Cholecystitis  RUQ pain - [place]  not concerning for acute cholecystitis, no cholelithiasis, CBD dilated up to 35m - elevated lipase and Alk phos concerning for passed or retained CBD stone - MRCP pending   FEN: NPO VTE: SCD's, lovenox ID: Rocephin and Flagyl 09/07>> Foley:none Follow up: TBD  Plan: MRCP to evaluate CBD, we will follow   JKalman Drape PAllen [facility] HospitalSur[facility] 10/23/2017, 9:41 AM Pager: [phone]: [phone]-Fri 7:00 am-4:30 pm Sat-Sun 7:00 am-11:30 am

## 2017-10-24 ENCOUNTER — Encounter (HOSPITAL_COMMUNITY): Payer: Self-pay | Admitting: Anesthesiology

## 2017-10-24 ENCOUNTER — Inpatient Hospital Stay (HOSPITAL_COMMUNITY): Payer: BLUE CROSS/BLUE SHIELD

## 2017-10-24 ENCOUNTER — Inpatient Hospital Stay (HOSPITAL_COMMUNITY): Payer: BLUE CROSS/BLUE SHIELD | Admitting: Anesthesiology

## 2017-10-24 ENCOUNTER — Other Ambulatory Visit: Payer: Self-pay

## 2017-10-24 ENCOUNTER — Encounter (HOSPITAL_COMMUNITY)
Admission: EM | Disposition: A | Discharge status: Home / Self Care | Payer: Self-pay | Source: Home / Self Care | Attending: Internal Medicine

## 2017-10-24 DIAGNOSIS — Z9049 Acquired absence of other specified parts of digestive tract: Secondary | ICD-10-CM

## 2017-10-24 HISTORY — PX: CHOLECYSTECTOMY: SHX55

## 2017-10-24 LAB — COMPREHENSIVE METABOLIC PANEL
ALT: 17 U/L (ref 0–44)
AST: 21 U/L (ref 15–41)
Albumin: 2.7 g/dL — ABNORMAL LOW (ref 3.5–5.0)
Alkaline Phosphatase: 104 U/L (ref 38–126)
Anion gap: 6 (ref 5–15)
BUN: 6 mg/dL (ref 6–20)
CO2: 26 mmol/L (ref 22–32)
Calcium: 8.3 mg/dL — ABNORMAL LOW (ref 8.9–10.3)
Chloride: 107 mmol/L (ref 98–111)
Creatinine, Ser: 0.81 mg/dL (ref 0.44–1.00)
GFR calc Af Amer: >60 mL/min (ref >60)
GFR calc non Af Amer: >60 mL/min (ref >60)
Glucose, Bld: 98 mg/dL (ref 70–99)
Potassium: 3.7 mmol/L (ref 3.5–5.1)
Sodium: 139 mmol/L (ref 135–145)
Total Bilirubin: 1.6 mg/dL — ABNORMAL HIGH (ref 0.3–1.2)
Total Protein: 5.5 g/dL — ABNORMAL LOW (ref 6.5–8.1)

## 2017-10-24 LAB — CBC
HCT: 36 % (ref 36.0–46.0)
Hemoglobin: 11.4 g/dL — ABNORMAL LOW (ref 12.0–15.0)
MCH: 29.1 pg (ref 26.0–34.0)
MCHC: 31.7 g/dL (ref 30.0–36.0)
MCV: 91.8 fL (ref 78.0–100.0)
Platelets: 145 10*3/uL — ABNORMAL LOW (ref 150–400)
RBC: 3.92 MIL/uL (ref 3.87–5.11)
RDW: 13.2 % (ref 11.5–15.5)
WBC: 5.7 10*3/uL (ref 4.0–10.5)

## 2017-10-24 LAB — HIV ANTIBODY (ROUTINE TESTING W REFLEX): HIV Screen 4th Generation wRfx: NONREACTIVE

## 2017-10-24 SURGERY — LAPAROSCOPIC CHOLECYSTECTOMY WITH INTRAOPERATIVE CHOLANGIOGRAM
Anesthesia: General | Site: Abdomen

## 2017-10-24 MED ORDER — PHENYLEPHRINE 40 MCG/ML (10ML) SYRINGE FOR IV PUSH (FOR BLOOD PRESSURE SUPPORT)
PREFILLED_SYRINGE | INTRAVENOUS | Status: DC | PRN
  Administered 2017-10-24: 120 ug via INTRAVENOUS

## 2017-10-24 MED ORDER — ARTIFICIAL TEARS OPHTHALMIC OINT
TOPICAL_OINTMENT | OPHTHALMIC | Status: AC
  Filled 2017-10-24: qty 3.5

## 2017-10-24 MED ORDER — IOPAMIDOL (ISOVUE-300) INJECTION 61%
INTRAVENOUS | Status: AC
  Filled 2017-10-24: qty 50

## 2017-10-24 MED ORDER — FENTANYL CITRATE (PF) 250 MCG/5ML IJ SOLN
INTRAMUSCULAR | Status: AC
  Filled 2017-10-24: qty 5

## 2017-10-24 MED ORDER — MIDAZOLAM HCL 2 MG/2ML IJ SOLN
INTRAMUSCULAR | Status: AC
  Filled 2017-10-24: qty 2

## 2017-10-24 MED ORDER — INFLUENZA VAC SPLIT QUAD 0.5 ML IM SUSY: 0.5000 mL | PREFILLED_SYRINGE | INTRAMUSCULAR | Status: DC | PRN

## 2017-10-24 MED ORDER — ACETAMINOPHEN 10 MG/ML IV SOLN
INTRAVENOUS | Status: AC
  Administered 2017-10-24: 1000 mg via INTRAVENOUS
  Filled 2017-10-24: qty 100

## 2017-10-24 MED ORDER — 0.9 % SODIUM CHLORIDE (POUR BTL) OPTIME
TOPICAL | Status: DC | PRN
  Administered 2017-10-24: 1000 mL

## 2017-10-24 MED ORDER — ONDANSETRON HCL 4 MG/2ML IJ SOLN
INTRAMUSCULAR | Status: DC | PRN
  Administered 2017-10-24: 4 mg via INTRAVENOUS

## 2017-10-24 MED ORDER — BUPIVACAINE-EPINEPHRINE 0.25% -1:200000 IJ SOLN
INTRAMUSCULAR | Status: DC | PRN
  Administered 2017-10-24: 7 mL

## 2017-10-24 MED ORDER — SODIUM CHLORIDE 0.9 % IR SOLN
Status: DC | PRN
  Administered 2017-10-24 (×2): 1000 mL

## 2017-10-24 MED ORDER — ROCURONIUM BROMIDE 50 MG/5ML IV SOSY
PREFILLED_SYRINGE | INTRAVENOUS | Status: AC
  Filled 2017-10-24: qty 5

## 2017-10-24 MED ORDER — LIDOCAINE 2% (20 MG/ML) 5 ML SYRINGE
INTRAMUSCULAR | Status: DC | PRN
  Administered 2017-10-24: 100 mg via INTRAVENOUS

## 2017-10-24 MED ORDER — PHENYLEPHRINE 40 MCG/ML (10ML) SYRINGE FOR IV PUSH (FOR BLOOD PRESSURE SUPPORT)
PREFILLED_SYRINGE | INTRAVENOUS | Status: AC
  Filled 2017-10-24: qty 10

## 2017-10-24 MED ORDER — ACETAMINOPHEN 10 MG/ML IV SOLN
1000.0000 mg | Freq: Once | INTRAVENOUS | Status: AC
  Administered 2017-10-24: 1000 mg via INTRAVENOUS

## 2017-10-24 MED ORDER — BUPIVACAINE-EPINEPHRINE (PF) 0.25% -1:200000 IJ SOLN
INTRAMUSCULAR | Status: AC
  Filled 2017-10-24: qty 30

## 2017-10-24 MED ORDER — FENTANYL CITRATE (PF) 100 MCG/2ML IJ SOLN
25.0000 ug | INTRAMUSCULAR | Status: DC | PRN
  Administered 2017-10-24: 25 ug via INTRAVENOUS

## 2017-10-24 MED ORDER — LIDOCAINE 2% (20 MG/ML) 5 ML SYRINGE
INTRAMUSCULAR | Status: AC
  Filled 2017-10-24: qty 5

## 2017-10-24 MED ORDER — PROPOFOL 10 MG/ML IV BOLUS
INTRAVENOUS | Status: AC
  Filled 2017-10-24: qty 20

## 2017-10-24 MED ORDER — MIDAZOLAM HCL 5 MG/5ML IJ SOLN
INTRAMUSCULAR | Status: DC | PRN
  Administered 2017-10-24: 2 mg via INTRAVENOUS

## 2017-10-24 MED ORDER — SODIUM CHLORIDE 0.9 % IV SOLN
INTRAVENOUS | Status: DC | PRN
  Administered 2017-10-24: 11 mL

## 2017-10-24 MED ORDER — SUGAMMADEX SODIUM 200 MG/2ML IV SOLN
INTRAVENOUS | Status: DC | PRN
  Administered 2017-10-24: 200 mg via INTRAVENOUS

## 2017-10-24 MED ORDER — PROPOFOL 10 MG/ML IV BOLUS
INTRAVENOUS | Status: DC | PRN
  Administered 2017-10-24: 160 mg via INTRAVENOUS

## 2017-10-24 MED ORDER — ROCURONIUM BROMIDE 10 MG/ML (PF) SYRINGE
PREFILLED_SYRINGE | INTRAVENOUS | Status: DC | PRN
  Administered 2017-10-24: 40 mg via INTRAVENOUS
  Administered 2017-10-24: 20 mg via INTRAVENOUS

## 2017-10-24 MED ORDER — FENTANYL CITRATE (PF) 100 MCG/2ML IJ SOLN
INTRAMUSCULAR | Status: AC
  Filled 2017-10-24: qty 2

## 2017-10-24 MED ORDER — FENTANYL CITRATE (PF) 100 MCG/2ML IJ SOLN
INTRAMUSCULAR | Status: DC | PRN
  Administered 2017-10-24 (×5): 50 ug via INTRAVENOUS

## 2017-10-24 MED ORDER — DEXAMETHASONE SODIUM PHOSPHATE 10 MG/ML IJ SOLN
INTRAMUSCULAR | Status: DC | PRN
  Administered 2017-10-24: 10 mg via INTRAVENOUS

## 2017-10-24 MED ORDER — ONDANSETRON HCL 4 MG/2ML IJ SOLN
INTRAMUSCULAR | Status: AC
  Filled 2017-10-24: qty 2

## 2017-10-24 MED ORDER — DEXAMETHASONE SODIUM PHOSPHATE 10 MG/ML IJ SOLN
INTRAMUSCULAR | Status: AC
  Filled 2017-10-24: qty 1

## 2017-10-24 SURGICAL SUPPLY — 42 items
APPLIER CLIP ROT 10 11.4 M/L (STAPLE) ×2
BIOPATCH RED 1 DISK 7.0 (GAUZE/BANDAGES/DRESSINGS) ×2 IMPLANT
CANISTER SUCT 3000ML PPV (MISCELLANEOUS) ×2 IMPLANT
CHLORAPREP W/TINT 26ML (MISCELLANEOUS) ×2 IMPLANT
CLIP APPLIE ROT 10 11.4 M/L (STAPLE) ×1 IMPLANT
COVER MAYO STAND STRL (DRAPES) ×2 IMPLANT
COVER SURGICAL LIGHT HANDLE (MISCELLANEOUS) ×2 IMPLANT
DERMABOND ADVANCED (GAUZE/BANDAGES/DRESSINGS) ×1
DERMABOND ADVANCED .7 DNX12 (GAUZE/BANDAGES/DRESSINGS) ×1 IMPLANT
DRAIN CHANNEL 19F RND (DRAIN) ×2 IMPLANT
DRAPE C-ARM 42X72 X-RAY (DRAPES) ×2 IMPLANT
DRSG TEGADERM 4X4.75 (GAUZE/BANDAGES/DRESSINGS) ×2 IMPLANT
ELECT REM PT RETURN 9FT ADLT (ELECTROSURGICAL) ×2
ELECTRODE REM PT RTRN 9FT ADLT (ELECTROSURGICAL) ×1 IMPLANT
EVACUATOR SILICONE 100CC (DRAIN) ×2 IMPLANT
GLOVE BIO SURGEON STRL SZ8 (GLOVE) ×2 IMPLANT
GLOVE BIOGEL PI IND STRL 8 (GLOVE) ×1 IMPLANT
GLOVE BIOGEL PI INDICATOR 8 (GLOVE) ×1
GOWN STRL REUS W/ TWL LRG LVL3 (GOWN DISPOSABLE) ×2 IMPLANT
GOWN STRL REUS W/ TWL XL LVL3 (GOWN DISPOSABLE) ×1 IMPLANT
GOWN STRL REUS W/TWL LRG LVL3 (GOWN DISPOSABLE) ×2
GOWN STRL REUS W/TWL XL LVL3 (GOWN DISPOSABLE) ×1
KIT BASIN OR (CUSTOM PROCEDURE TRAY) ×2 IMPLANT
KIT TURNOVER KIT B (KITS) ×2 IMPLANT
NS IRRIG 1000ML POUR BTL (IV SOLUTION) ×2 IMPLANT
PAD ARMBOARD 7.5X6 YLW CONV (MISCELLANEOUS) ×2 IMPLANT
POUCH RETRIEVAL ECOSAC 10 (ENDOMECHANICALS) ×1 IMPLANT
POUCH RETRIEVAL ECOSAC 10MM (ENDOMECHANICALS) ×1
SCISSORS LAP 5X35 DISP (ENDOMECHANICALS) ×2 IMPLANT
SET CHOLANGIOGRAPH 5 50 .035 (SET/KITS/TRAYS/PACK) ×2 IMPLANT
SET IRRIG TUBING LAPAROSCOPIC (IRRIGATION / IRRIGATOR) ×2 IMPLANT
SLEEVE ENDOPATH XCEL 5M (ENDOMECHANICALS) ×2 IMPLANT
SPECIMEN JAR SMALL (MISCELLANEOUS) ×2 IMPLANT
SUT ETHILON 2 0 FS 18 (SUTURE) ×2 IMPLANT
SUT MNCRL AB 4-0 PS2 18 (SUTURE) ×2 IMPLANT
TOWEL OR 17X24 6PK STRL BLUE (TOWEL DISPOSABLE) ×2 IMPLANT
TRAY LAPAROSCOPIC MC (CUSTOM PROCEDURE TRAY) ×2 IMPLANT
TROCAR XCEL BLUNT TIP 100MML (ENDOMECHANICALS) ×2 IMPLANT
TROCAR XCEL NON-BLD 11X100MML (ENDOMECHANICALS) ×2 IMPLANT
TROCAR XCEL NON-BLD 5MMX100MML (ENDOMECHANICALS) ×2 IMPLANT
TUBING INSUFFLATION (TUBING) ×2 IMPLANT
WATER STERILE IRR 1000ML POUR (IV SOLUTION) ×2 IMPLANT

## 2017-10-24 NOTE — Transfer of Care (Signed)
Immediate Anesthesia Transfer of Care Note  Patient: Heidi Chapman  Procedure(s) Performed: LAPAROSCOPIC CHOLECYSTECTOMY WITH INTRAOPERATIVE CHOLANGIOGRAM (N/A Abdomen)  Patient Location: PACU  Anesthesia Type:General  Level of Consciousness: oriented, drowsy and patient cooperative  Airway & Oxygen Therapy: Patient Spontanous Breathing and Patient connected to face mask oxygen  Post-op Assessment: Report given to RN and Post -op Vital signs reviewed and stable  Post vital signs: Reviewed  Last Vitals:  Vitals Value Taken Time  BP 143/72 10/24/2017 10:50 AM  Temp    Pulse 91 10/24/2017 10:54 AM  Resp 22 10/24/2017 10:54 AM  SpO2 96 % 10/24/2017 10:54 AM  Vitals shown include unvalidated device data.  Last Pain:  Vitals:   10/24/17 0628  TempSrc:   PainSc: 8       Patients Stated Pain Goal: 2 (71/85/50 1586)  Complications: No apparent anesthesia complications

## 2017-10-24 NOTE — Anesthesia Preprocedure Evaluation (Addendum)
Anesthesia Evaluation  Patient identified by MRN, date of birth, ID band Patient awake    Reviewed: Allergy & Precautions, NPO status , Patient's Chart, lab work & pertinent test results  Airway Mallampati: II  TM Distance: >3 FB Neck ROM: Full    Dental  (+) Teeth Intact, Dental Advisory Given   Pulmonary Current Smoker,    breath sounds clear to auscultation       Cardiovascular negative cardio ROS   Rhythm:Regular Rate:Normal     Neuro/Psych    GI/Hepatic Neg liver ROS, GERD  Medicated and Controlled,History noted. CG   Endo/Other  negative endocrine ROS  Renal/GU negative Renal ROS     Musculoskeletal   Abdominal   Peds  Hematology   Anesthesia Other Findings   Reproductive/Obstetrics                            Anesthesia Physical Anesthesia Plan  ASA: II  Anesthesia Plan: General   Post-op Pain Management:    Induction: Intravenous, Rapid sequence and Cricoid pressure planned  PONV Risk Score and Plan: 0  Airway Management Planned: Oral ETT  Additional Equipment:   Intra-op Plan:   Post-operative Plan: Extubation in OR  Informed Consent: I have reviewed the patients History and Physical, chart, labs and discussed the procedure including the risks, benefits and alternatives for the proposed anesthesia with the patient or authorized representative who has indicated his/her understanding and acceptance.   Dental advisory given  Plan Discussed with: CRNA, Anesthesiologist and Surgeon  Anesthesia Plan Comments:         Anesthesia Quick Evaluation

## 2017-10-24 NOTE — Progress Notes (Signed)
   Subjective:   Today, Heidi Chapman was examined in the PACU after undergoing laparoscopic cholecystectomy. She complained on soreness which was expected post-op. Nurse advised that we avoid narcotics for pain meds as she might have low tolerance for opioids.   Objective:  Vital signs in last 24 hours: Vitals:   10/24/17 1050 10/24/17 1051 10/24/17 1052 10/24/17 1115  BP: (!) 143/72     Pulse: 96 92 95 85  Resp: (!) 22 17 (!) 25 (!) 23  Temp: 99 F (37.2 C)     TempSrc:      SpO2: 98% 96% 97% 95%  Weight:      Height:       Const: Expected post-op distress, lying in bed, expressed gratitude to the primary team  CV: RRR, no MGR Resp: CTA anteriorly, no wheezes, crackles, rubs appreciated  Abd: BS+, JP drain   Assessment/Plan:  Active Problems:   Cholecystitis  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 Doing well otherwise, only complaint now is soreness.  -Follow up pathology results  -Appreciate general surgery reccs -Pain regimen: HOLD Dilaudid for now and continue Tylenol.   *Was noted to have low RR post op -Will continue ceftriaxone and flagyl for another day  -Zofran prn nausea  -Blood Cx NGTD -Encourage Inspiration spirometer    At discharge, she will need to be seen by a primary care provider to establish care.   FEN: Replace electrolytes as needed, clear liquid diet  VTE ppx: Lovenox 40 SQ [facility] status: Full Code  Dispo: Admit patient to inpatient with expected length of stay less than 2 midnights.    Jean Rosenthal, MD 10/24/2017, 11:52 AM Pager: [phone] IMTS PGY-1

## 2017-10-24 NOTE — Op Note (Signed)
Laparoscopic Cholecystectomy with IOC Procedure Note  Indications: This patient presents with symptomatic gallbladder disease and will undergo laparoscopic cholecystectomy. The procedure has been discussed with the patient. Operative and non operative treatments have been discussed. Risks of surgery include bleeding, infection,  Common bile duct injury,  Injury to the stomach,liver, colon,small intestine, abdominal wall,  Diaphragm,  Major blood vessels,  And the need for an open procedure.  Other risks include worsening of medical problems, death,  DVT and pulmonary embolism, and cardiovascular events.   Medical options have also been discussed. The patient has been informed of long term expectations of surgery and non surgical options,  The patient agrees to proceed.    Pre-operative Diagnosis: Acute cholecystitis  Post-operative Diagnosis: Same  Surgeon: Joyice Faster Sava Proby   Assistants: none   Anesthesia: General endotracheal anesthesia and Local anesthesia 0.25.% bupivacaine, with epinephrine  ASA Class: 2  Procedure Details  The patient was seen again in the Holding Room. The risks, benefits, complications, treatment options, and expected outcomes were discussed with the patient. The possibilities of reaction to medication, pulmonary aspiration, perforation of viscus, bleeding, recurrent infection, finding a normal gallbladder, the need for additional procedures, failure to diagnose a condition, the possible need to convert to an open procedure, and creating a complication requiring transfusion or operation were discussed with the patient. The patient and/or family concurred with the proposed plan, giving informed consent. The site of surgery properly noted/marked. The patient was taken to Operating Room, identified as [facility] and the procedure verified as Laparoscopic Cholecystectomy with Intraoperative Cholangiograms. A Time Out was held and the above information confirmed.  Prior to  the induction of general anesthesia, antibiotic prophylaxis was administered. General endotracheal anesthesia was then administered and tolerated well. After the induction, the abdomen was prepped in the usual sterile fashion. The patient was positioned in the supine position with the left arm comfortably tucked, along with some reverse Trendelenburg.  Local anesthetic agent was injected into the skin near the umbilicus and an incision made. The midline fascia was incised and the Hasson technique was used to introduce a 12 mm port under direct vision. It was secured with a figure of eight Vicryl suture placed in the usual fashion. Pneumoperitoneum was then created with CO2 and tolerated well without any adverse changes in the patient's vital signs. Additional trocars were introduced under direct vision with an 11 mm trocar in the epigastrium and two  5 mm trocars in the right upper quadrant. All skin incisions were infiltrated with a local anesthetic agent before making the incision and placing the trocars.   The gallbladder was identified, the fundus grasped and retracted cephalad. Adhesions were lysed bluntly and with the electrocautery where indicated, taking care not to injure any adjacent organs or viscus.  There were dense adhesions from the omentum to the fundus of the gallbladder.  These appeared chronic and were quite dense.  I was able to dissect the gallbladder from the omental adhesions using a combination of blunt dissection with good hemostasis.  Inspection of the colon revealed no injury to the colon or duodenum which were out of the operative field.  The infundibulum was grasped and retracted laterally, exposing the peritoneum overlying the triangle of Calot. This was then divided and exposed in a blunt fashion. The cystic duct was clearly identified and bluntly dissected circumferentially. The junctions of the gallbladder, cystic duct and common bile duct were clearly identified prior to the  division of any linear structure.  An incision was made in the cystic duct and the cholangiogram catheter introduced. The catheter was secured using an endoclip. The study showed no stones and good visualization of the distal and proximal biliary tree. The catheter was then removed.   The cystic duct was then  ligated with surgical clips  on the patient side and  clipped on the gallbladder side and divided. The cystic artery was identified, dissected free, ligated with clips and divided as well. Posterior cystic artery clipped and divided.  The gallbladder was dissected from the liver bed in retrograde fashion with the electrocautery. The gallbladder was removed. The liver bed was irrigated and inspected. Hemostasis was achieved with the electrocautery. Copious irrigation was utilized and was repeatedly aspirated until clear all particulate matter. Hemostasis was achieved with no signs  Of bleeding or bile leakage.  Pneumoperitoneum was completely reduced after viewing removal of the trocars under direct vision. The wound was thoroughly irrigated and the fascia was then closed with a figure of eight suture; the skin was then closed with 4 O monocryl and a sterile dressing was applied of Dermabond.   Instrument, sponge, and needle counts were correct at closure and at the conclusion of the case.   Findings: Cholecystitis without Cholelithiasis  Estimated Blood Loss: less than 100 mL         Drains: 19 F         Total IV Fluids: per anesthesia record         Specimens: Gallbladder           Complications: None; patient tolerated the procedure well.         Disposition: PACU - hemodynamically stable.         Condition: stable

## 2017-10-24 NOTE — Progress Notes (Signed)
Pt continues to c/o of pain intermittently, but falls back to sleep & is snoring after 60mcg of Fentanyl. Pt confirms she has been told she snores. C.Estephania Licciardi RN unable to establish if pt has had a sleep study done in past. RR 6-15, shallow. Dr Nyoka Cowden at bedside, updated-will give one dose of Ofirmev in PACU. Internal Medicine residents at the bedside with Dr Daryll Drown - updated on "how sleepy pt was prior to arrival in OR w/ IV dilaudid dose pre-op" Dr Nyoka Cowden requests RN follow up with Internal Medicine regarding pulse  Ox for floor and possible sleep study.

## 2017-10-24 NOTE — Progress Notes (Signed)
Patient arrived to 6n12 drowsy but oriented x4, LH IV intact and infusing.  Noted to have 3 port sites with skin glue closure.  R sided JP drain present and set to suction with bloody drainage.  Patient denies pain at this time.  Family at bedside.  Will continue to monitor.

## 2017-10-24 NOTE — Anesthesia Procedure Notes (Signed)
Procedure Name: Intubation Date/Time: 10/24/2017 8:47 AM Performed by: Jenne Campus, CRNA Pre-anesthesia Checklist: Patient identified, Emergency Drugs available, Suction available and Patient being monitored Patient Re-evaluated:Patient Re-evaluated prior to induction Oxygen Delivery Method: Circle System Utilized Preoxygenation: Pre-oxygenation with 100% oxygen Induction Type: IV induction Ventilation: Mask ventilation without difficulty Laryngoscope Size: Miller and 2 Grade View: Grade I Tube type: Oral Tube size: 7.5 mm Number of attempts: 1 Airway Equipment and Method: Stylet and Oral airway Placement Confirmation: ETT inserted through vocal cords under direct vision,  positive ETCO2 and breath sounds checked- equal and bilateral Secured at: 22 cm Tube secured with: Tape Dental Injury: Teeth and Oropharynx as per pre-operative assessment

## 2017-10-24 NOTE — Progress Notes (Signed)
  Date: 10/24/2017  Patient name: Heidi Chapman  Medical record number: [id number]  Date of birth: 1955-03-05   I have seen and evaluated this patient and I have discussed the plan of care with the house staff. Please see Dr. Nelia Shi note for complete details. I concur with his findings.  Sid Falcon, MD 10/24/2017, 8:26 PM

## 2017-10-24 NOTE — Anesthesia Postprocedure Evaluation (Signed)
Anesthesia Post Note  Patient: Architectural technologist  Procedure(s) Performed: LAPAROSCOPIC CHOLECYSTECTOMY WITH INTRAOPERATIVE CHOLANGIOGRAM (N/A Abdomen)     Patient location during evaluation: PACU Anesthesia Type: General Level of consciousness: awake Pain management: pain level controlled Vital Signs Assessment: post-procedure vital signs reviewed and stable Respiratory status: spontaneous breathing Cardiovascular status: stable Postop Assessment: no apparent nausea or vomiting Anesthetic complications: no    Last Vitals:  Vitals:   10/24/17 1051 10/24/17 1052  BP:    Pulse: 92 95  Resp: 17 (!) 25  Temp:    SpO2: 96% 97%    Last Pain:  Vitals:   10/24/17 0628  TempSrc:   PainSc: 8                  Evie Croston

## 2017-10-24 NOTE — Progress Notes (Signed)
Page to Internal med resident at 1155 - call back by Dr Eileen Stanford. Updated on pt's somnolent status- orders to be placed for cont pulse ox on 6N, per MD. Post op pain meds to be switched to po vs IV. C Florinda Taflinger, RN will f/u with Dr Brantley Stage or PA

## 2017-10-24 NOTE — Interval H&P Note (Signed)
History and Physical Interval Note:  10/24/2017 8:18 AM  Heidi Chapman  has presented today for surgery, with the diagnosis of CHOLECYSTITIS/CHOLELITHIASIS  The various methods of treatment have been discussed with the patient and family. After consideration of risks, benefits and other options for treatment, the patient has consented to  Procedure(s): LAPAROSCOPIC CHOLECYSTECTOMY WITH INTRAOPERATIVE CHOLANGIOGRAM (N/A) as a surgical intervention .  The patient's history has been reviewed, patient examined, no change in status, stable for surgery.  I have reviewed the patient's chart and labs.  Questions were answered to the patient's satisfaction.    Pt seen examined and agree MRI negative for CBD stone  Thickened area noted   The procedure has been discussed with the patient. Operative and non operative treatments have been discussed. Risks of surgery include bleeding, infection,  Common bile duct injury,  Injury to the stomach,liver, colon,small intestine, abdominal wall,  Diaphragm,  Major blood vessels,  And the need for an open procedure.  Other risks include worsening of medical problems, death,  DVT and pulmonary embolism, and cardiovascular events.   Medical options have also been discussed. The patient has been informed of long term expectations of surgery and non surgical options,  The patient agrees to proceed.     [place]

## 2017-10-25 ENCOUNTER — Encounter (HOSPITAL_COMMUNITY): Payer: Self-pay | Admitting: Surgery

## 2017-10-25 DIAGNOSIS — Z978 Presence of other specified devices: Secondary | ICD-10-CM

## 2017-10-25 LAB — COMPREHENSIVE METABOLIC PANEL
ALT: 51 U/L — ABNORMAL HIGH (ref 0–44)
AST: 50 U/L — ABNORMAL HIGH (ref 15–41)
Albumin: 2.7 g/dL — ABNORMAL LOW (ref 3.5–5.0)
Alkaline Phosphatase: 119 U/L (ref 38–126)
Anion gap: 7 (ref 5–15)
BUN: 6 mg/dL (ref 6–20)
CO2: 25 mmol/L (ref 22–32)
Calcium: 8.4 mg/dL — ABNORMAL LOW (ref 8.9–10.3)
Chloride: 110 mmol/L (ref 98–111)
Creatinine, Ser: 0.64 mg/dL (ref 0.44–1.00)
GFR calc Af Amer: >60 mL/min (ref >60)
GFR calc non Af Amer: >60 mL/min (ref >60)
Glucose, Bld: 123 mg/dL — ABNORMAL HIGH (ref 70–99)
Potassium: 3.9 mmol/L (ref 3.5–5.1)
Sodium: 142 mmol/L (ref 135–145)
Total Bilirubin: 1.2 mg/dL (ref 0.3–1.2)
Total Protein: 5.8 g/dL — ABNORMAL LOW (ref 6.5–8.1)

## 2017-10-25 LAB — CBC
HCT: 37 % (ref 36.0–46.0)
Hemoglobin: 12.1 g/dL (ref 12.0–15.0)
MCH: 29.1 pg (ref 26.0–34.0)
MCHC: 32.7 g/dL (ref 30.0–36.0)
MCV: 88.9 fL (ref 78.0–100.0)
Platelets: 169 10*3/uL (ref 150–400)
RBC: 4.16 MIL/uL (ref 3.87–5.11)
RDW: 12.5 % (ref 11.5–15.5)
WBC: 7.7 10*3/uL (ref 4.0–10.5)

## 2017-10-25 LAB — CANCER ANTIGEN 19-9: CA 19-9: 131 U/mL — ABNORMAL HIGH (ref 0–35)

## 2017-10-25 MED ORDER — ACETAMINOPHEN 325 MG PO TABS: 650.0000 mg | ORAL_TABLET | Freq: Four times a day (QID) | ORAL | Status: DC | PRN

## 2017-10-25 MED ORDER — AMOXICILLIN-POT CLAVULANATE 875-125 MG PO TABS
1.0000 | ORAL_TABLET | Freq: Two times a day (BID) | ORAL | Status: DC
  Administered 2017-10-25: 1 via ORAL
  Filled 2017-10-25: qty 1

## 2017-10-25 MED ORDER — SACCHAROMYCES BOULARDII 250 MG PO CAPS
250.0000 mg | ORAL_CAPSULE | Freq: Two times a day (BID) | ORAL | Status: DC
  Administered 2017-10-25: 250 mg via ORAL
  Filled 2017-10-25: qty 1

## 2017-10-25 MED ORDER — AMOXICILLIN-POT CLAVULANATE 875-125 MG PO TABS: 1.0000 | ORAL_TABLET | Freq: Two times a day (BID) | ORAL | 0 refills | Status: DC

## 2017-10-25 MED ORDER — OXYCODONE HCL 5 MG PO TABS: 5.0000 mg | ORAL_TABLET | ORAL | 0 refills | Status: DC | PRN

## 2017-10-25 MED ORDER — CODEINE SULFATE 15 MG PO TABS: 30.0000 mg | ORAL_TABLET | Freq: Four times a day (QID) | ORAL | Status: DC | PRN

## 2017-10-25 MED ORDER — CODEINE SULFATE 30 MG PO TABS: 30.0000 mg | ORAL_TABLET | Freq: Four times a day (QID) | ORAL | 0 refills | Status: DC | PRN

## 2017-10-25 MED ORDER — SACCHAROMYCES BOULARDII 250 MG PO CAPS: ORAL_CAPSULE | ORAL | Status: DC

## 2017-10-25 NOTE — Progress Notes (Addendum)
   Subjective:  Overnight: Underwent Laparoscopic cholecystectomy   Today, Heidi Chapman was examined at bedside and reports of being sore but is otherwise doing well. She is passing flatus, but no bowel movement yet. She is also ambulating and tolerating her diet. Currently denies fevers, chills, nausea, vomiting.   Objective:  Vital signs in last 24 hours: Vitals:   10/24/17 1345 10/24/17 1415 10/24/17 2002 10/25/17 0618  BP:  134/73 132/79 140/86  Pulse: 94 72 81 84  Resp: (!) '24 14 18 16  '$ Temp: 97.9 F (36.6 C) 98.7 F (37.1 C) 98.6 F (37 C) 98.2 F (36.8 C)  TempSrc:  Oral Oral Oral  SpO2: 95% 97% 92% 96%  Weight:      Height:       Const: In mild distress secondary to soreness, otherwise very pleasant and lying in bed  CV: RRR, no MGR Resp: CTABL, no wheezes, crackles, rhonchi  Abd: BS+, mildly TTP, non distended, laparoscopic incisions CDI, JP drain at RLQ with serosanguinous fluid Ext: No pitting edema   Assessment/Plan:  Active Problems:   Cholecystitis  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 Doing really well and only complaint is abdominal soreness. Passing flatus, tolerating diet and ambulating with no difficulties. Denies fevers, chills, nausea, vomiting.  -Appreciate general surgery reccs:  *Augmentin 5 days post op (Lst day 10/29/17)  *1 week follow up for JP drain re-evaluation  *Follow up pathology results  -Zofran prn nausea    Patient has been given a card to establish care with [facility] on 11/02/17 at 2:45PM  Dispo: Anticipated discharge today once  clinically stable.    Jean Rosenthal, MD 10/25/2017, 10:32 AM Pager: [phone] IMT[facility] PGY-1

## 2017-10-25 NOTE — Discharge Instructions (Signed)
[facility] ______[facility] °LAPAROSCOPIC SURGERY: POST OP INSTRUCTIONS °Always review your discharge instruction sheet given to you by the facility where your surgery was performed. °IF YOU HAVE DISABILITY OR FAMILY LEAVE FORMS, YOU MUST BRING THEM TO THE OFFICE FOR PROCESSING.   °DO NOT GIVE THEM TO YOUR DOCTOR. ° °1. A prescription for pain medication may be given to you upon discharge.  Take your pain medication as prescribed, if needed.  If narcotic pain medicine is not needed, then you may take acetaminophen (Tylenol) or ibuprofen (Advil) as needed. °2. Take your usually prescribed medications unless otherwise directed. °3. If you need a refill on your pain medication, please contact your pharmacy.  They will contact our office to request authorization. Prescriptions will not be filled after 5pm or on week-ends. °4. You should follow a light diet the first few days after arrival home, such as soup and crackers, etc.  Be sure to include lots of fluids daily. °5. Most patients will experience some swelling and bruising in the area of the incisions.  Ice packs will help.  Swelling and bruising can take several days to resolve.  °6. It is common to experience some constipation if taking pain medication after surgery.  Increasing fluid intake and taking a stool softener (such as Colace) will usually help or prevent this problem from occurring.  A mild laxative (Milk of Magnesia or Miralax) should be taken according to package instructions if there are no bowel movements after 48 hours. °7. Unless discharge instructions indicate otherwise, you may remove your bandages 24-48 hours after surgery, and you may shower at that time.  You may have steri-strips (small skin tapes) in place directly over the incision.  These strips should be left on the skin for 7-10 days.  If your surgeon used skin glue on the incision, you may shower in 24 hours.  The glue will flake off over the next 2-3 weeks.  Any sutures or  staples will be removed at the office during your follow-up visit. °8. ACTIVITIES:  You may resume regular (light) daily activities beginning the next day--such as daily self-care, walking, climbing stairs--gradually increasing activities as tolerated.  You may have sexual intercourse when it is comfortable.  Refrain from any heavy lifting or straining until approved by your doctor. °a. You may drive when you are no longer taking prescription pain medication, you can comfortably wear a seatbelt, and you can safely maneuver your car and apply brakes. °b. RETURN TO WORK:  __________________________________________________________ °9. You should see your doctor in the office for a follow-up appointment approximately 2-3 weeks after your surgery.  Make sure that you call for this appointment within a day or two after you arrive home to insure a convenient appointment time. °10. OTHER INSTRUCTIONS: __________________________________________________________________________________________________________________________ __________________________________________________________________________________________________________________________ °WHEN TO CALL YOUR DOCTOR: °1. Fever over 101.0 °2. Inability to urinate °3. Continued bleeding from incision. °4. Increased pain, redness, or drainage from the incision. °5. Increasing abdominal pain ° °The clinic staff is available to answer your questions during regular business hours.  Please don’t hesitate to call and ask to speak to one of the nurses for clinical concerns.  If you have a medical emergency, go to the nearest emergency room or call 911.  A surgeon from [facility] is always on call at the hospital. °[address], [address], [place], [place]  [address] ? [address] [address], [place],    [address] °[phone] ? [phone] ? FAX [phone] °Web site:   [email or web address] ° ° °Laparoscopic Cholecystectomy, Care After °This sheet  gives you information about how to care for yourself after your procedure. Your health care provider may also give you more specific instructions. If you have problems or questions, contact your health care provider. °What can I expect after the procedure? °After the procedure, it is common to have: °· Pain at your incision sites. You will be given medicines to control this pain. °· Mild nausea or vomiting. °· Bloating and possible shoulder pain from the air-like gas that was used during the procedure. ° °Follow these instructions at home: °Incision care ° °· Follow instructions from your health care provider about how to take care of your incisions. Make sure you: °? Wash your hands with soap and water before you change your bandage (dressing). If soap and water are not available, use hand sanitizer. °? Change your dressing as told by your health care provider. °? Leave stitches (sutures), skin glue, or adhesive strips in place. These skin closures may need to be in place for 2 weeks or longer. If adhesive strip edges start to loosen and curl up, you may trim the loose edges. Do not remove adhesive strips completely unless your health care provider tells you to do that. °· Do not take baths, swim, or use a hot tub until your health care provider approves. Ask your health care provider if you can take showers. You may only be allowed to take sponge baths for bathing. °· Check your incision area every day for signs of infection. Check for: °? More redness, swelling, or pain. °? More fluid or blood. °? Warmth. °? Pus or a bad smell. °Activity °· Do not drive or use heavy machinery while taking prescription pain medicine. °· Do not lift anything that is heavier than 10 lb (4.5 kg) until your health care provider approves. °· Do not play contact sports until your health care provider approves. °· Do not drive for 24 hours if you were given a medicine to help you relax (sedative). °· Rest as needed. Do not return to work  or school until your health care provider approves. °General instructions °· Take over-the-counter and prescription medicines only as told by your health care provider. °· To prevent or treat constipation while you are taking prescription pain medicine, your health care provider may recommend that you: °? Drink enough fluid to keep your urine clear or pale yellow. °? Take over-the-counter or prescription medicines. °? Eat foods that are high in fiber, such as fresh fruits and vegetables, whole grains, and beans. °? Limit foods that are high in fat and processed sugars, such as fried and sweet foods. °Contact a health care provider if: °· You develop a rash. °· You have more redness, swelling, or pain around your incisions. °· You have more fluid or blood coming from your incisions. °· Your incisions feel warm to the touch. °· You have pus or a bad smell coming from your incisions. °· You have a fever. °· One or more of your incisions breaks open. °Get help right away if: °· You have trouble breathing. °· You have chest pain. °· You have increasing pain in your shoulders. °· You faint or feel dizzy when you stand. °· You have severe pain in your abdomen. °· You have nausea or vomiting that lasts for more than one day. °· You have leg pain. °This information is not intended to replace advice given to you by your health care provider. Make sure you   discuss any questions you have with your health care provider. Document Released: 02/02/2005 Document Revised: 08/24/2015 Document Reviewed: 07/22/2015 Elsevier Interactive Patient Education  2018 [place] Surgical drains are used to remove extra fluid that normally builds up in a surgical wound after surgery. A surgical drain helps to heal a surgical wound. Different kinds of surgical drains include:  Active drains. These drains use suction to pull drainage away from the surgical wound. Drainage flows through a tube to a container  outside of the body. It is important to keep the bulb or the drainage container flat (compressed) at all times, except while you empty it. Flattening the bulb or container creates suction. The two most common types of active drains are bulb drains and Hemovac drains.  Passive drains. These drains allow fluid to drain naturally, by gravity. Drainage flows through a tube to a bandage (dressing) or a container outside of the body. Passive drains do not need to be emptied. The most common type of passive drain is the Penrose drain.  A drain is placed during surgery. Immediately after surgery, drainage is usually bright red and a little thicker than water. The drainage may gradually turn yellow or pink and become thinner. It is likely that your health care provider will remove the drain when the drainage stops or when the amount decreases to 1-2 Tbsp (15-30 mL) during a 24-hour period. How to care for your surgical drain  Keep the skin around the drain dry and covered with a dressing at all times.  Check your drain area every day for signs of infection. Check for: ? More redness, swelling, or pain. ? Pus or a bad smell. ? Cloudy drainage. Follow instructions from your health care provider about how to take care of your drain and how to change your dressing. Change your dressing at least one time every day. Change it more often if needed to keep the dressing dry. Make sure you: 1. Gather your supplies, including: ? Tape. ? Germ-free cleaning solution (sterile saline). ? Split gauze drain sponge: 4 x 4 inches (10 x 10 cm). ? Gauze square: 4 x 4 inches (10 x 10 cm). 2. Wash your hands with soap and water before you change your dressing. If soap and water are not available, use hand sanitizer. 3. Remove the old dressing. Avoid using scissors to do that. 4. Use sterile saline to clean your skin around the drain. 5. Place the tube through the slit in a drain sponge. Place the drain sponge so that it  covers your wound. 6. Place the gauze square or another drain sponge on top of the drain sponge that is on the wound. Make sure the tube is between those layers. 7. Tape the dressing to your skin. 8. If you have an active bulb or Hemovac drain, tape the drainage tube to your skin 1-2 inches (2.5-5 cm) below the place where the tube enters your body. Taping keeps the tube from pulling on any stitches (sutures) that you have. 9. Wash your hands with soap and water. 10. Write down the color of your drainage and how often you change your dressing.  How to empty your active bulb or Hemovac drain 1. Make sure that you have a measuring cup that you can empty your drainage into. 2. Wash your hands with soap and water. If soap and water are not available, use hand sanitizer. 3. Gently move your fingers down the tube while squeezing very  lightly. This is called stripping the tube. This clears any drainage, clots, or tissue from the tube. ? Do not pull on the tube. ? You may need to strip the tube several times every day to keep the tube clear. 4. Open the bulb cap or the drain plug. Do not touch the inside of the cap or the bottom of the plug. 5. Empty all of the drainage into the measuring cup. 6. Compress the bulb or the container and replace the cap or the plug. To compress the bulb or the container, squeeze it firmly in the middle while you close the cap or plug the container. 7. Write down the amount of drainage that you have in each 24-hour period. If you have less than 2 Tbsp (30 mL) of drainage during 24 hours, contact your health care provider. 8. Flush the drainage down the toilet. 9. Wash your hands with soap and water. Contact a health care provider if:  You have more redness, swelling, or pain around your drain area.  The amount of drainage that you have is increasing instead of decreasing.  You have pus or a bad smell coming from your drain area.  You have a fever.  You have drainage  that is cloudy.  There is a sudden stop or a sudden decrease in the amount of drainage that you have.  Your tube falls out.  Your active draindoes not stay compressedafter you empty it. This information is not intended to replace advice given to you by your health care provider. Make sure you discuss any questions you have with your health care provider. Document Released: 01/31/2000 Document Revised: 07/11/2015 Document Reviewed: 08/22/2014 Elsevier Interactive Patient Education  2018 [employer].

## 2017-10-25 NOTE — Progress Notes (Signed)
1 Day Post-Op    CC: Abdominal pain  Subjective: Patient is doing fairly well this morning.  She is currently sore.  JP drain is full of serosanguineous fluid.  Port sites all look good, she is hungry.  Doing well with clears.  Objective: Vital signs in last 24 hours: Temp:  [97.9 F (36.6 C)-99 F (37.2 C)] 98.2 F (36.8 C) (09/09 0618) Pulse Rate:  [72-96] 84 (09/09 0618) Resp:  [7-37] 16 (09/09 0618) BP: (126-148)/(72-88) 140/86 (09/09 0618) SpO2:  [92 %-98 %] 96 % (09/09 0618) Last BM Date: 10/22/17 858 p.o. 2 L IV 350 urine recorded Drain 165 Afebrile vital signs are stable Labs show mild elevation of AST ALT normal white count   Intake/Output from previous day: 09/08 0701 - 09/09 0700 In: 2988 [P.O.:738; I.V.:1950; IV Piggyback:300] Out: 640 [Urine:350; Drains:165; Blood:125] Intake/Output this shift: No intake/output data recorded.  General appearance: alert, cooperative and no distress Resp: clear to auscultation bilaterally GI: Soft, sore, port sites all look good.  Drain is serosanguineous.  Lab Results:  Recent Labs    10/24/17 0601 10/25/17 0531  WBC 5.7 7.7  HGB 11.4* 12.1  HCT 36.0 37.0  PLT 145* 169    BMET Recent Labs    10/24/17 0601 10/25/17 0531  NA 139 142  K 3.7 3.9  CL 107 110  CO2 26 25  GLUCOSE 98 123*  BUN 6 6  CREATININE 0.81 0.64  CALCIUM 8.3* 8.4*   PT/INR No results for input(s): LABPROT, INR in the last 72 hours.  Recent Labs  Lab 10/23/17 0258 10/24/17 0601 10/25/17 0531  AST 20 21 50*  ALT 18 17 51*  ALKPHOS 147* 104 119  BILITOT 0.8 1.6* 1.2  PROT 7.2 5.5* 5.8*  ALBUMIN 3.7 2.7* 2.7*    Prior to Admission medications   Medication Sig Start Date End Date Taking? Authorizing Provider  bismuth subsalicylate (PEPTO BISMOL) 262 MG chewable tablet Chew 524 mg by mouth every evening.   Yes [provider]  cimetidine (TAGAMET) 200 MG tablet Take 200 mg by mouth 2 (two) times daily.   Yes [provider]    Lipase     Component Value Date/Time   LIPASE 69 (H) 10/23/2017 0258     Medications: . enoxaparin (LOVENOX) injection  40 mg Subcutaneous Q24H   . lactated ringers 125 mL/hr at 10/24/17 0600    Assessment/Plan  Acute cholecystitis Laparoscopic cholecystectomy with IOC, 10/24/2017, Dr. Marcello Moores Cornett  FEN: IV fluids/clear liquids ID:   Flagyl/Rocephin 9/7 =>> day 3 DVT: Lovenox Follow-up [facility] clinic next week  Plan: Advance diet.  She needs 5 days of postoperative antibiotics.  Dr. Brantley Stage would like to leave the drain in for 1 week.  I will arrange follow-up for drain removal next week.  We will switch her over to Augmentin for p.o. antibiotics.  Once she is ambulating well, pain is well tolerated with oral pain medications, she is eating and voiding she can go home.  Will look at her later today and see if she is ready.  I have personally reviewed the patients medication history on the [place] controlled substance database.  I have placed instructions in AVS, Rx for Augmentin, and Codeine are in her drawer.    LOS: 2 days    Telesforo Brosnahan 10/25/2017 [phone]

## 2017-10-25 NOTE — Discharge Summary (Signed)
Name: Heidi Chapman MRN: [id number] DOB: 04/13/1955 62 y.o. PCP: Patient, No Pcp Per  Date of Admission: 10/23/2017  2:44 AM Date of Discharge:  Attending Physician: Sid Falcon, MD  Discharge Diagnosis: 1. 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   amoxicillin-clavulanate 875-125 MG tablet Commonly known as:  AUGMENTIN Take 1 tablet by mouth every 12 (twelve) hours.   bismuth subsalicylate 132 MG chewable tablet Commonly known as:  PEPTO BISMOL Chew 524 mg by mouth every evening.   cimetidine 200 MG tablet Commonly known as:  TAGAMET Take 200 mg by mouth 2 (two) times daily.   codeine 30 MG tablet Take 1 tablet (30 mg total) by mouth every 6 (six) hours as needed for moderate pain or severe pain (pain not relieved by oral Tylenol).   saccharomyces boulardii 250 MG capsule Commonly known as:  FLORASTOR You can buy this at any drug store, and whichever brand you choose.  I would take for the next 2 weeks.  Follow the package instructions for dose.       Disposition and follow-up:   Heidi Chapman was discharged from [facility] in [place] condition.  At the hospital follow up visit please address:  1.  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  Labs / imaging needed at time of follow-up: None  3.  Pending labs/ test needing follow-up: Pathology results from cholecystectomy   Follow-up Appointments: Follow-up Information    [facility], [facility].   Specialty:  General  Surgery Why:  Computers are down, the office will call and set you up for drain removal next week.  Be at the office 30 minutes early, bring photo ID and insurance information. Contact information: [address] [place] [address] [phone]           Hospital Course by problem list: 1. 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 She also reported of subjective fevers, chills and nausea. In the ED, her vitals were within normal limit and remained afebrile. Her CBC was negative for leukocytosis. CMP showed elevated alk phos. Also, she had mildly elevated lipase. On imaging, abdominal ultrasound revealed no evidence of cholecystitis, borderline pancreatic ductal dilation, mild splenic enlargement and CBD dilation of 74m. CT abdomen and pelvis shows acute cholecystitis, no stones identified. MRCP showed Focal ill-defined enhancing soft tissue density involving the gallbladder fundus and adjacent pericholecystic fat suspicious for acute cholecystitis, with differential diagnosis also including perforated gallbladder carcinoma. She underwent laparoscopic cholecystectomy on 10/24/2017. She had an uneventful post op course and was subsequently discharge on a 5 day course of Augmentin.   Discharge Vitals:   BP 140/86 (BP Location: Right Arm)   Pulse 84   Temp 98.2 F (36.8 C) (Oral)   Resp 16   Ht 5' 5"  (1.651 m)   Wt 96.1  kg   SpO2 96%   BMI 35.26 kg/m   Pertinent Labs, Studies, and Procedures:  Abdominal ultrasound  IMPRESSION: 1. Contracted gallbladder, likely physiologic. No stones. No evidence of cholecystitis. 2. Dilated extrahepatic bile ducts. Cause is not determined. Correlate with liver function tests. Could consider MRCP for  further evaluation. 3. Borderline pancreatic ductal dilatation. 4. Mild splenic enlargement.  CT abdomen and pelvis  IMPRESSION: 1. Consolidation and atelectasis in the lung bases may represent pneumonia. 2. Gallbladder wall thickening with pericholecystic edema and infiltration in the surrounding fat consistent with acute cholecystitis. No stones identified. 3. Extrahepatic bile duct dilatation. Mild pancreatic ductal dilatation. No cause identified. No evidence of acute pancreatitis. 4. Duodenal diverticula.  MRCP  IMPRESSION: Focal ill-defined enhancing soft tissue density involving the gallbladder fundus and adjacent pericholecystic fat. This is suspicious for acute cholecystitis, with differential diagnosis also including perforated gallbladder carcinoma.  Mild biliary ductal dilatation with common bile duct measuring 11 mm. No evidence of choledocholithiasis or other obstructing etiology.  No evidence of pancreatic mass or ductal dilatation.  Bilateral lower lobe atelectasis versus infiltrate.  Cholangiogram IMPRESSION: Intraoperative cholangiogram demonstrates extrahepatic biliary ducts mildly dilated, with no definite filling defects identified. Free flow of contrast across the ampulla  Discharge Instructions: Discharge Instructions    Call MD for:  difficulty breathing, headache or visual disturbances   Complete by:  As directed    Call MD for:  extreme fatigue   Complete by:  As directed    Call MD for:  hives   Complete by:  As directed    Call MD for:  persistant dizziness or light-headedness   Complete by:  As directed    Call MD for:  persistant nausea and vomiting   Complete by:  As directed    Call MD for:  redness, tenderness, or signs of infection (pain, swelling, redness, odor or green/yellow discharge around incision site)   Complete by:  As directed    Call MD for:  severe uncontrolled pain   Complete by:  As directed    Call MD for:   temperature >100.4   Complete by:  As directed    Diet - low sodium heart healthy   Complete by:  As directed    Discharge instructions   Complete by:  As directed    Ms. Brophy,   It was a pleasure taking care of you at the hospital. You were admitted for cholecystitis (inglammation of your gallbladder) and had surgery.   Please take the antibiotics as prescribed by the surgeon. You will take one Augmentin twice a day for a total of 5 days.   You should receive a call from the surgery team to set up an appointment for the care of your drain.   Also, I have set up an appointment for you on Tuesday 11/02/17 with the [facility] at 2:45 pm.   ~Take Care Dr. Eileen Stanford   Increase activity slowly   Complete by:  As directed       Signed: Jean Rosenthal, MD 10/25/2017, 1:56 PM   Pager: [phone] IMT[facility] PGY-1

## 2017-10-25 NOTE — Progress Notes (Signed)
Discharge instructions given to Pt. Pt discharged home with friend. Pt left ambulatory

## 2017-10-28 LAB — CULTURE, BLOOD (ROUTINE X 2)
Culture: NO GROWTH
Culture: NO GROWTH
Special Requests: ADEQUATE

## 2017-11-02 ENCOUNTER — Ambulatory Visit (INDEPENDENT_AMBULATORY_CARE_PROVIDER_SITE_OTHER): Payer: BLUE CROSS/BLUE SHIELD | Admitting: Internal Medicine

## 2017-11-02 VITALS — BP 114/66 | HR 90 | Temp 97.9°F | Ht 65.0 in | Wt 159.9 lb

## 2017-11-02 DIAGNOSIS — Z09 Encounter for follow-up examination after completed treatment for conditions other than malignant neoplasm: Secondary | ICD-10-CM

## 2017-11-02 DIAGNOSIS — Z9049 Acquired absence of other specified parts of digestive tract: Secondary | ICD-10-CM | POA: Diagnosis not present

## 2017-11-02 DIAGNOSIS — F17210 Nicotine dependence, cigarettes, uncomplicated: Secondary | ICD-10-CM

## 2017-11-02 DIAGNOSIS — Z90710 Acquired absence of both cervix and uterus: Secondary | ICD-10-CM | POA: Diagnosis not present

## 2017-11-02 DIAGNOSIS — K819 Cholecystitis, unspecified: Secondary | ICD-10-CM

## 2017-11-02 DIAGNOSIS — Z978 Presence of other specified devices: Secondary | ICD-10-CM

## 2017-11-02 DIAGNOSIS — Z90722 Acquired absence of ovaries, bilateral: Secondary | ICD-10-CM

## 2017-11-02 NOTE — Progress Notes (Signed)
   CC: For hospital follow-up.  HPI:  [name] is a 62 y.o. with no significant past medical history was admitted at [facility] earlier in September with right upper quadrant pain and diagnosed with acute acalculous cholecystitis and underwent laparoscopic cholecystectomy.  Patient was discharged home with the drain and Augmentin, she completed full course of Augmentin.  Today patient was feeling much better.  Continued to have mild discomfort at surgical site.  Was going to see her surgeon after this appointment to have her drain removed.  She denies any nausea or vomiting.  She denies any unintentional weight loss. She is having a normal appetite now.  Denies any recent change in her bowel habits.  Denies any urinary symptoms.  Patient has an history of total abdominal hysterectomy with bilateral oophorectomy 17 years ago when they found a suspicious lesion on 1 of the ovary, it was proven benign on biopsy. During current hospitalization they checked her for CA 19 9 which was elevated, it can be elevated because of cholecystitis.  Wants to get established with our clinic.  Past Medical History:  Diagnosis Date  . GERD (gastroesophageal reflux disease)    Family history.  Both parents had diabetes.  Social history.  Smokes half a pack per day since teenager, occasionally drinks alcohol.  Denies any illicit drug use.  Review of Systems: Negative except mentioned in HPI.  Physical Exam:  Vitals:   11/02/17 1511  BP: 114/66  Pulse: 90  Temp: 97.9 F (36.6 C)  TempSrc: Oral  Weight: 159 lb 14.4 oz (72.5 kg)  Height: 5\' 5"  (1.651 m)   General: Vital signs reviewed.  Patient is well-developed and well-nourished, in no acute distress and cooperative with exam.  Head: Normocephalic and atraumatic. Eyes: EOMI, conjunctivae normal, no scleral icterus.  Cardiovascular: RRR, S1 normal, S2 normal, no murmurs, gallops, or rubs. Pulmonary/Chest: Clear to auscultation  bilaterally, no wheezes, rales, or rhonchi. Abdominal: Soft, mild right upper quadrant tenderness, well-healing surgical scar with drain in place with minimum drainage, non-distended, BS + Extremities: No lower extremity edema bilaterally,  pulses symmetric and intact bilaterally. No cyanosis or clubbing. Skin: Warm, dry and intact. No rashes or erythema. Psychiatric: Normal mood and affect. speech and behavior is normal. Cognition and memory are normal.  Assessment & Plan:   See Encounters Tab for problem based charting.  Patient seen with Dr. Daryll Drown.

## 2017-11-02 NOTE — Patient Instructions (Addendum)
Thank you for visiting clinic today.  I am glad that you are doing well. We will repeat some lab work which include your liver function and CA 19  9.  We will call you with any abnormal results. Please follow-up with your PCP in 1 month.

## 2017-11-02 NOTE — Assessment & Plan Note (Signed)
Patient with recent admission due to a calculus cholecystitis now post cholecystectomy. Symptoms has been resolved. Pathology shows chronic cholecystitis, no malignancy. Was found to have elevated CA 19 9, this can be elevated because of cholecystitis. Patient has her complete hysterectomy done 17 years ago and ovarian lesion was found to be benign at that time. No other concerning features for malignancy at this time.  -We will repeat CA-19-9, if trending down then we will just monitor, if remain elevated or trending up then she will need further evaluation. -Repeat CMP as she was having mild transaminitis during hospitalization.

## 2017-11-03 DIAGNOSIS — D485 Neoplasm of uncertain behavior of skin: Secondary | ICD-10-CM | POA: Diagnosis not present

## 2017-11-03 DIAGNOSIS — D225 Melanocytic nevi of trunk: Secondary | ICD-10-CM | POA: Diagnosis not present

## 2017-11-03 DIAGNOSIS — B078 Other viral warts: Secondary | ICD-10-CM | POA: Diagnosis not present

## 2017-11-03 DIAGNOSIS — L821 Other seborrheic keratosis: Secondary | ICD-10-CM | POA: Diagnosis not present

## 2017-11-03 LAB — CMP14 + ANION GAP
ALT: 20 IU/L (ref 0–32)
AST: 18 IU/L (ref 0–40)
Albumin/Globulin Ratio: 1.5 (ref 1.2–2.2)
Albumin: 4.1 g/dL (ref 3.6–4.8)
Alkaline Phosphatase: 206 IU/L — ABNORMAL HIGH (ref 39–117)
Anion Gap: 16 mmol/L (ref 10.0–18.0)
BUN/Creatinine Ratio: 13 (ref 12–28)
BUN: 11 mg/dL (ref 8–27)
Bilirubin Total: 0.6 mg/dL (ref 0.0–1.2)
CO2: 23 mmol/L (ref 20–29)
Calcium: 9.6 mg/dL (ref 8.7–10.3)
Chloride: 105 mmol/L (ref 96–106)
Creatinine, Ser: 0.83 mg/dL (ref 0.57–1.00)
GFR calc Af Amer: 89 mL/min/{1.73_m2} (ref >59)
GFR calc non Af Amer: 77 mL/min/{1.73_m2} (ref >59)
Globulin, Total: 2.7 g/dL (ref 1.5–4.5)
Glucose: 84 mg/dL (ref 65–99)
Potassium: 4.9 mmol/L (ref 3.5–5.2)
Sodium: 144 mmol/L (ref 134–144)
Total Protein: 6.8 g/dL (ref 6.0–8.5)

## 2017-11-03 LAB — CANCER ANTIGEN 19-9: CA 19-9: 10 U/mL (ref 0–35)

## 2017-11-11 NOTE — Progress Notes (Signed)
Internal Medicine Clinic Attending  Case discussed with Dr. Amin at the time of the visit.  We reviewed the resident's history and exam and pertinent patient test results.  I agree with the assessment, diagnosis, and plan of care documented in the resident's note.    

## 2017-12-02 ENCOUNTER — Other Ambulatory Visit: Payer: Self-pay

## 2017-12-02 ENCOUNTER — Encounter: Payer: Self-pay | Admitting: Internal Medicine

## 2017-12-02 ENCOUNTER — Ambulatory Visit (INDEPENDENT_AMBULATORY_CARE_PROVIDER_SITE_OTHER): Payer: BLUE CROSS/BLUE SHIELD | Admitting: Internal Medicine

## 2017-12-02 DIAGNOSIS — F17210 Nicotine dependence, cigarettes, uncomplicated: Secondary | ICD-10-CM

## 2017-12-02 DIAGNOSIS — K219 Gastro-esophageal reflux disease without esophagitis: Secondary | ICD-10-CM

## 2017-12-02 DIAGNOSIS — Z13220 Encounter for screening for lipoid disorders: Secondary | ICD-10-CM | POA: Diagnosis not present

## 2017-12-02 DIAGNOSIS — Z09 Encounter for follow-up examination after completed treatment for conditions other than malignant neoplasm: Secondary | ICD-10-CM | POA: Diagnosis not present

## 2017-12-02 DIAGNOSIS — Z9049 Acquired absence of other specified parts of digestive tract: Secondary | ICD-10-CM | POA: Diagnosis not present

## 2017-12-02 DIAGNOSIS — K819 Cholecystitis, unspecified: Secondary | ICD-10-CM

## 2017-12-02 DIAGNOSIS — Z90710 Acquired absence of both cervix and uterus: Secondary | ICD-10-CM

## 2017-12-02 DIAGNOSIS — Z0000 Encounter for general adult medical examination without abnormal findings: Secondary | ICD-10-CM

## 2017-12-02 NOTE — Patient Instructions (Signed)
Ms. Langhorne, It was a pleasure meeting you today! I look forward to serving as your doctor for the next few years!  I am so glad you are feeling better after having your gallbladder out! Remember it can take time for the digestive system to adjust and loose stools are normal. Try to avoid fatty foods as much as possible.   We will plan to see you back next year to start setting up mammogram and colonoscopy.   If you have any questions or concerns beforehand, please don't hesitate to call!   Take care!  Dr. Koleen Distance

## 2017-12-03 LAB — LIPID PANEL
Chol/HDL Ratio: 3.7 ratio (ref 0.0–4.4)
Cholesterol, Total: 206 mg/dL — ABNORMAL HIGH (ref 100–199)
HDL: 55 mg/dL (ref >39)
LDL Calculated: 114 mg/dL — ABNORMAL HIGH (ref 0–99)
Triglycerides: 186 mg/dL — ABNORMAL HIGH (ref 0–149)
VLDL Cholesterol Cal: 37 mg/dL (ref 5–40)

## 2017-12-06 ENCOUNTER — Encounter: Payer: Self-pay | Admitting: Internal Medicine

## 2017-12-06 DIAGNOSIS — Z0000 Encounter for general adult medical examination without abnormal findings: Secondary | ICD-10-CM | POA: Insufficient documentation

## 2017-12-06 HISTORY — DX: Encounter for general adult medical examination without abnormal findings: Z00.00

## 2017-12-06 NOTE — Assessment & Plan Note (Signed)
S/p laparoscopic cholecystectomy on 9/8. Patient is doing well. Denies abdominal pain, n/v, fevers or chills. Continues to have loose stools after eating. Encouraged her to avoid high fat and oily foods as much as possible.

## 2017-12-06 NOTE — Assessment & Plan Note (Addendum)
Lipid profile with mild LDL elevation at 114. She was not fasting for this. Will not initiate statin therapy at this time; repeat as fasting lab at next visit.  Patient is currently smoking 1/2 ppd. Advised that smoking cessation is the best thing she can do for her overall health and to reduce risk of MI and CVA. She would like to see if her insurance will cover Chantix, as this has worked for her previously. Eliminating this risk factor would decrease 10 year risk of cardiovascular event from 5.9 to 2.3%.  She will be due for mammogram and colonoscopy this year. Would like to wait until start of new year before scheduling so she is able to take the time off of work  Does not need pap smears given she is s/p total hysterectomy

## 2017-12-06 NOTE — Progress Notes (Signed)
Internal Medicine Clinic Attending  I saw and evaluated the patient.  I personally confirmed the key portions of the history and exam documented by Dr. Bloomfield and I reviewed pertinent patient test results.  The assessment, diagnosis, and plan were formulated together and I agree with the documentation in the resident's note.  

## 2017-12-06 NOTE — Progress Notes (Signed)
   CC: s/p lap cholecystectomy   HPI:  [name] is a 62 y.o. female with PMHx GERD presents for follow-up after laparoscopic cholecystectomy on 9/8 and to establish care. Path results confirmed acalculous cholecystitis.  Since surgery, she is feeling much better in general. Denies abdominal pain, fevers, chills n/v. Endorses good appetite. She notes loose stools after eating anything other than bland foods.    Past Medical History:  Diagnosis Date  . GERD (gastroesophageal reflux disease)    Review of Systems:   Constitutional: negative for weight loss, difficulty sleeping Cardio: negative for chest pain, palpitations Resp: negative for cough, shortness of breath GU: negative for dysuria, hematuria MSK: negative for swelling, joint pain   Physical Exam:  Vitals:   12/02/17 1538  BP: 116/66  Pulse: 92  Temp: 98.6 F (37 C)  TempSrc: Oral  SpO2: 98%  Weight: 153 lb 11.2 oz (69.7 kg)  Height: 5\' 5"  (1.651 m)   General: alert, pleasant female, appears stated age, NAD CV: RRR; no murmurs, rubs or gallops Pulm: normal respiratory effort; lungs CTA bilaterally  Abd: BS+; abdomen is soft, non-tender, non-distended Ext: no edema Skin: warm and dry; no rashes  Psych: appropriate mood and affect  Assessment & Plan:   See Encounters Tab for problem based charting.  Patient seen with Dr. Evette Doffing

## 2018-02-11 ENCOUNTER — Encounter (HOSPITAL_BASED_OUTPATIENT_CLINIC_OR_DEPARTMENT_OTHER): Payer: Self-pay | Admitting: *Deleted

## 2018-02-11 ENCOUNTER — Emergency Department (HOSPITAL_BASED_OUTPATIENT_CLINIC_OR_DEPARTMENT_OTHER): Payer: BLUE CROSS/BLUE SHIELD

## 2018-02-11 ENCOUNTER — Other Ambulatory Visit: Payer: Self-pay

## 2018-02-11 ENCOUNTER — Emergency Department (HOSPITAL_BASED_OUTPATIENT_CLINIC_OR_DEPARTMENT_OTHER)
Admission: EM | Admit: 2018-02-11 | Discharge: 2018-02-11 | Disposition: A | Discharge status: Home / Self Care | Payer: BLUE CROSS/BLUE SHIELD | Attending: Emergency Medicine | Admitting: Emergency Medicine

## 2018-02-11 DIAGNOSIS — R05 Cough: Secondary | ICD-10-CM | POA: Diagnosis not present

## 2018-02-11 DIAGNOSIS — J181 Lobar pneumonia, unspecified organism: Secondary | ICD-10-CM | POA: Insufficient documentation

## 2018-02-11 DIAGNOSIS — F17210 Nicotine dependence, cigarettes, uncomplicated: Secondary | ICD-10-CM | POA: Diagnosis not present

## 2018-02-11 DIAGNOSIS — J189 Pneumonia, unspecified organism: Secondary | ICD-10-CM

## 2018-02-11 MED ORDER — FLUCONAZOLE 200 MG PO TABS: ORAL_TABLET | ORAL | 0 refills | Status: DC

## 2018-02-11 MED ORDER — IPRATROPIUM-ALBUTEROL 0.5-2.5 (3) MG/3ML IN SOLN
3.0000 mL | Freq: Once | RESPIRATORY_TRACT | Status: AC
  Administered 2018-02-11: 3 mL via RESPIRATORY_TRACT
  Filled 2018-02-11: qty 3

## 2018-02-11 MED ORDER — ALBUTEROL SULFATE HFA 108 (90 BASE) MCG/ACT IN AERS
1.0000 | INHALATION_SPRAY | Freq: Once | RESPIRATORY_TRACT | Status: AC
  Administered 2018-02-11: 1 via RESPIRATORY_TRACT
  Filled 2018-02-11: qty 6.7

## 2018-02-11 MED ORDER — DOXYCYCLINE HYCLATE 100 MG PO CAPS: 100.0000 mg | ORAL_CAPSULE | Freq: Two times a day (BID) | ORAL | 0 refills | Status: AC

## 2018-02-11 MED ORDER — FLUTICASONE PROPIONATE 50 MCG/ACT NA SUSP: 2.0000 | Freq: Every day | NASAL | 0 refills | Status: DC

## 2018-02-11 MED FILL — FLUCONAZOLE 200 MG TAB: 200 | 1 days supply | Qty: 1 | Fill #0

## 2018-02-11 MED FILL — DOXYCYCLINE HYCLATE 100 MG: 100 | 7 days supply | Qty: 14 | Fill #0

## 2018-02-11 MED FILL — FLUTICASONE PROP 50 MCG SPR: 50 | 30 days supply | Qty: 16 | Fill #0

## 2018-02-11 NOTE — Progress Notes (Signed)
Patient ambulated around department. SOB noted and coughing, but SAT 97-98% entire walk. Patient taken to xray and then treatment to be given,

## 2018-02-11 NOTE — ED Notes (Signed)
Pt reports generalized body aches fatigue and fevers. Pt has been taking OTC medication with no relief.

## 2018-02-11 NOTE — Discharge Instructions (Signed)
Please take all of your antibiotics until finished!   You may develop abdominal discomfort or diarrhea from the antibiotic.  You may help offset this with probiotics which you can buy or get in yogurt. Do not eat or take the probiotics until 2 hours after your antibiotic.   Use the albuterol inhaler 1 to 2 puffs every 4-6 hours as needed for shortness of breath.  You can use over-the-counter cold and flu medicines such as Mucinex to help with your cough as well.  Flonase for nasal congestion. Alternate 600 mg of ibuprofen and [phone] mg of Tylenol every 3 hours as needed for pain/fever. Do not exceed 4000 mg of Tylenol daily.  Take ibuprofen with food to avoid upset stomach issues.  Make sure to drink lots of water and get lots of rest.  Follow-up with your primary care doctor for reevaluation of your symptoms and possible repeat x-ray in 3 to 4 weeks.  Return to the emergency department if any concerning signs or symptoms such as severe shortness of breath, high fevers not controlled by ibuprofen or Tylenol, chest pain, or persistent vomiting.

## 2018-02-11 NOTE — ED Triage Notes (Signed)
Cough x 4 weeks. She started taking OTC medication yesterday with some relief.

## 2018-02-11 NOTE — ED Provider Notes (Signed)
[facility] EMERGENCY DEPARTMENT Provider Note   CSN: [id number] Arrival date & time: 02/11/18  1523     History   Chief Complaint Chief Complaint  Patient presents with  . Cough    HPI Heidi Chapman is a 62 y.o. female with history of GERD presents for evaluation of acute onset, persistent cough for 4 weeks.  She notes symptoms began after sick contact when she was getting her car fixed.  Cough is nonproductive but a few days ago she began taking over-the-counter decongestant with some expectorated mucus.  She notes shortness of breath particularly with exertion, denies chest pain.  Admits she has had low-grade fevers T-max 101 F intermittently throughout the month.  Also endorses sinus pressure, nasal congestion, scratchy throat.  Denies nausea, vomiting, or abdominal pain.  Has not tried anything else for her symptoms.  No recent travel out of the country.  She is a current smoker of approximately a quarter of a pack of cigarettes daily.  The history is provided by the patient.    Past Medical History:  Diagnosis Date  . GERD (gastroesophageal reflux disease)     Patient Active Problem List   Diagnosis Date Noted  . Healthcare maintenance 12/06/2017  . Acalculous cholecystitis 10/23/2017    Past Surgical History:  Procedure Laterality Date  . CESAREAN SECTION    . CHOLECYSTECTOMY N/A 10/24/2017   Procedure: LAPAROSCOPIC CHOLECYSTECTOMY WITH INTRAOPERATIVE CHOLANGIOGRAM;  Surgeon: Erroll Luna, MD;  Location: [facility];  Service: General;  Laterality: N/A;     OB History   No obstetric history on file.      Home Medications    Prior to Admission medications   Medication Sig Start Date End Date Taking? Authorizing Provider  acetaminophen (TYLENOL) 325 MG tablet Take 2 tablets (650 mg total) by mouth every 6 (six) hours as needed for mild pain (or Fever >/= 101). 10/25/17  Yes Earnstine Regal, PA-C  cimetidine (TAGAMET) 200 MG tablet Take 200 mg by mouth 2  (two) times daily.   Yes [provider]  bismuth subsalicylate (PEPTO BISMOL) 262 MG chewable tablet Chew 524 mg by mouth every evening.    [provider]  codeine 30 MG tablet Take 1 tablet (30 mg total) by mouth every 6 (six) hours as needed for moderate pain or severe pain (pain not relieved by oral Tylenol). 10/25/17   Earnstine Regal, PA-C  doxycycline (VIBRAMYCIN) 100 MG capsule Take 1 capsule (100 mg total) by mouth 2 (two) times daily for 7 days. 02/11/18 02/18/18  Rodell Perna A, PA-C  fluticasone (FLONASE) 50 MCG/ACT nasal spray Place 2 sprays into both nostrils daily. 02/11/18   Maximo Spratling A, PA-C  saccharomyces boulardii (FLORASTOR) 250 MG capsule You can buy this at any drug store, and whichever brand you choose.  I would take for the next 2 weeks.  Follow the package instructions for dose. 10/25/17   Earnstine Regal, PA-C    Family History No family history on file.  Social History Social History   Tobacco Use  . Smoking status: Current Every Day Smoker  . Smokeless tobacco: Never Used  Substance Use Topics  . Alcohol use: Never    Frequency: Never  . Drug use: Never     Allergies   Patient has no known allergies.   Review of Systems Review of Systems  Constitutional: Positive for fever.  HENT: Positive for sinus pressure and sore throat. Negative for drooling.   Respiratory: Positive for cough and shortness  of breath.   Cardiovascular: Negative for chest pain.  Gastrointestinal: Negative for abdominal pain, nausea and vomiting.  All other systems reviewed and are negative.    Physical Exam Updated Vital Signs BP 95/62 (BP Location: Right Arm)   Pulse 79   Temp 98.5 F (36.9 C) (Oral)   Resp 18   Ht 5\' 5"  (1.651 m)   Wt 72.6 kg   SpO2 96%   BMI 26.63 kg/m   Physical Exam Vitals signs and nursing note reviewed.  Constitutional:      General: She is not in acute distress.    Appearance: She is well-developed.  HENT:     Head:  Normocephalic and atraumatic.     Right Ear: Ear canal and external ear normal. There is no impacted cerumen.     Left Ear: Ear canal and external ear normal. There is no impacted cerumen.     Nose: Congestion present.     Comments: Bilateral maxillary sinus tenderness.    Mouth/Throat:     Mouth: Mucous membranes are moist.     Pharynx: No oropharyngeal exudate or posterior oropharyngeal erythema.     Comments: Mild postnasal drip noted.  Tolerating secretions without difficulty.  No trismus, uvular deviation, exudates, or sublingual abnormalities Eyes:     General:        Right eye: No discharge.        Left eye: No discharge.     Conjunctiva/sclera: Conjunctivae normal.     Pupils: Pupils are equal, round, and reactive to light.  Neck:     Musculoskeletal: Normal range of motion and neck supple. No neck rigidity.     Vascular: No JVD.     Trachea: No tracheal deviation.  Cardiovascular:     Rate and Rhythm: Normal rate.     Pulses: Normal pulses.     Heart sounds: Normal heart sounds.  Pulmonary:     Effort: Pulmonary effort is normal.     Comments: Globally diminished breath sounds, speaking in full sentences without difficulty. Chest:     Chest wall: No tenderness.  Abdominal:     General: Bowel sounds are normal. There is no distension.     Tenderness: There is no abdominal tenderness. There is no guarding.  Lymphadenopathy:     Cervical: No cervical adenopathy.  Skin:    General: Skin is warm and dry.     Findings: No erythema.  Neurological:     Mental Status: She is alert.  Psychiatric:        Behavior: Behavior normal.      ED Treatments / Results  Labs (all labs ordered are listed, but only abnormal results are displayed) Labs Reviewed - No data to display  EKG None  Radiology Dg Chest 2 View  Result Date: 02/11/2018 CLINICAL DATA:  Worsening cough and congestion EXAM: CHEST - 2 VIEW COMPARISON:  CT abdomen 10/23/2017 FINDINGS: There is hazy left  lower lobe airspace disease which may reflect atelectasis versus pneumonia. There is no other focal parenchymal opacity. There is no pleural effusion or pneumothorax. The heart and mediastinal contours are unremarkable. The osseous structures are unremarkable. IMPRESSION: Hazy left lower lobe airspace disease which may reflect atelectasis versus pneumonia. Electronically Signed   By: Kathreen Devoid   On: 02/11/2018 16:12    Procedures Procedures (including critical care time)  Medications Ordered in ED Medications  ipratropium-albuterol (DUONEB) 0.5-2.5 (3) MG/3ML nebulizer solution 3 mL (3 mLs Nebulization Given 02/11/18 1607)  albuterol (PROVENTIL  HFA;VENTOLIN HFA) 108 (90 Base) MCG/ACT inhaler 1 puff (1 puff Inhalation Given 02/11/18 1701)     Initial Impression / Assessment and Plan / ED Course  I have reviewed the triage vital signs and the nursing notes.  Pertinent labs & imaging results that were available during my care of the patient were reviewed by me and considered in my medical decision making (see chart for details).     Patient with cough and low-grade fevers and sinus pressure for 1 month.  She is afebrile, vital signs are stable in the ED.  She is nontoxic in appearance.  She has globally diminished breath sounds on auscultation of the lungs, will give breathing treatment.  We will also obtain chest x-ray and ambulate on pulse oximetry for further evaluation.  Chest x-ray suggestive of left lower lobe airspace disease.  Given duration of symptoms with fever, will treat with doxycycline which will also cover for possible sinusitis.  She was ambulated on pulse oximetry in the ED with stable SPO2 saturations.  Doubt ACS/MI, PE.  No complaints of chest pain and symptoms appear more infectious in etiology.  Recommend follow-up with PCP for reevaluation of symptoms.  Discussed strict ED return precautions. Pt verbalized understanding of and agreement with plan and is safe for discharge  home at this time.   Final Clinical Impressions(s) / ED Diagnoses   Final diagnoses:  Community acquired pneumonia of left lower lobe of lung [facility])    ED Discharge Orders         Ordered    fluticasone (FLONASE) 50 MCG/ACT nasal spray  Daily     02/11/18 1649    doxycycline (VIBRAMYCIN) 100 MG capsule  2 times daily     02/11/18 1649           Renita Papa, PA-C 02/11/18 1706    Virgel Manifold, MD 02/11/18 1815

## 2019-01-26 ENCOUNTER — Encounter: Payer: Self-pay | Admitting: Internal Medicine

## 2019-01-26 ENCOUNTER — Other Ambulatory Visit: Payer: Self-pay

## 2019-01-26 ENCOUNTER — Ambulatory Visit: Payer: BC Managed Care – PPO | Admitting: Internal Medicine

## 2019-01-26 VITALS — BP 126/81 | HR 85 | Temp 98.0°F | Ht 65.0 in | Wt 179.2 lb

## 2019-01-26 DIAGNOSIS — M25562 Pain in left knee: Secondary | ICD-10-CM | POA: Diagnosis not present

## 2019-01-26 DIAGNOSIS — M25561 Pain in right knee: Secondary | ICD-10-CM

## 2019-01-26 DIAGNOSIS — M25541 Pain in joints of right hand: Secondary | ICD-10-CM | POA: Diagnosis not present

## 2019-01-26 DIAGNOSIS — M2550 Pain in unspecified joint: Secondary | ICD-10-CM | POA: Diagnosis not present

## 2019-01-26 DIAGNOSIS — M353 Polymyalgia rheumatica: Secondary | ICD-10-CM

## 2019-01-26 DIAGNOSIS — Z23 Encounter for immunization: Secondary | ICD-10-CM | POA: Diagnosis not present

## 2019-01-26 DIAGNOSIS — F419 Anxiety disorder, unspecified: Secondary | ICD-10-CM

## 2019-01-26 DIAGNOSIS — M25521 Pain in right elbow: Secondary | ICD-10-CM

## 2019-01-26 DIAGNOSIS — M25542 Pain in joints of left hand: Secondary | ICD-10-CM | POA: Diagnosis not present

## 2019-01-26 DIAGNOSIS — M542 Cervicalgia: Secondary | ICD-10-CM

## 2019-01-26 DIAGNOSIS — M25522 Pain in left elbow: Secondary | ICD-10-CM

## 2019-01-26 HISTORY — DX: Anxiety disorder, unspecified: F41.9

## 2019-01-26 HISTORY — DX: Pain in unspecified joint: M25.50

## 2019-01-26 NOTE — Progress Notes (Signed)
   CC: Anxiety  HPI:  [name] is a 63 y.o. with history of acalculous cholecystitis who presents for anxiety. Please see problem based charting for evaluation, assessment, and plan.  Past Medical History:  Diagnosis Date  . GERD (gastroesophageal reflux disease)    Review of Systems:    Review of Systems  Constitutional: Negative for chills and fever.  Gastrointestinal: Negative for nausea and vomiting.  Musculoskeletal: Positive for joint pain and neck pain.   Physical Exam:  Vitals:   01/26/19 1013  BP: 126/81  Pulse: 85  Temp: 98 F (36.7 C)  SpO2: 97%  Weight: 179 lb 3.2 oz (81.3 kg)  Height: 5\' 5"  (1.651 m)   Physical Exam  Constitutional: She is oriented to person, place, and time. She appears well-developed and well-nourished. No distress.  HENT:  Head: Normocephalic and atraumatic.  Eyes: Conjunctivae are normal.  Cardiovascular: Normal rate, regular rhythm and normal heart sounds.  Respiratory: Effort normal and breath sounds normal. No respiratory distress. She has no wheezes.  GI: Soft. Bowel sounds are normal. She exhibits no distension. There is no abdominal tenderness.  Musculoskeletal:        General: No edema.     Comments: Right elbow and wrist with mild swelling  No tenderness to palpation, swelling, or warmth to touch at neck, bilateral shoulders, left elbow, mcps, or ankles  Neurological: She is alert and oriented to person, place, and time.  Skin: She is not diaphoretic.  1cm in size nodules present over bilateral mcp joints that are firm  Psychiatric: She has a normal mood and affect. Her behavior is normal. Judgment and thought content normal.       Assessment & Plan:   See Encounters Tab for problem based charting.  Patient discussed with Dr. Dareen Piano

## 2019-01-26 NOTE — Patient Instructions (Addendum)
It was a pleasure to see you today Heidi Chapman. Please make the following changes:  For your anxiety:  I have referred you to integrated behavioral health   For your joint pain: I have collected labs and will let you know about the results  Follow up in 1 month  If you have any questions or concerns, please call our clinic at [phone] between 9am-5pm and after hours call [phone] and ask for the internal medicine resident on call. If you feel you are having a medical emergency please call 911.   Thank you, we look forward to help you remain healthy!  Lars Mage, MD Internal Medicine PGY3

## 2019-01-26 NOTE — Assessment & Plan Note (Signed)
The patient states that over the past year she has noticed that she has been having difficulty focusing, sleep disturbances, fatigue, easily worried, gaining weight though she has been trying to eat more healthier. She has been working a very stressful job. Denies suicidal or homicidal ideation.   Fells different from depression that she has been on prior. She has been on antidepressants, anxiolytics, and antipsychotics prior. She states that previously the only thing that works are low doses of xanax.   She currently sees a Social worker at work every 2 week.   Assessment and plan  The patient's symptoms are consistent with anxiety disorder vs generalized anxiety disorder. Will refer for counseling.   -referral to integrated health

## 2019-01-26 NOTE — Assessment & Plan Note (Signed)
The patient states that she has bilateral mcp joints, knees, elbows, occasionally in neck. She has had symptoms for the past 2-3 years but the pain in her mcp joint pain is worse over the past 1 year.  No morning stiffness.    She had a work for rheumatologic issues 8 years ago in [place] that was negative and it was determined that she may have fibromyalgia.   Assessment Will order inflammatory markers-esr, crp, rf, ana, anti ccp to evaluate for inflammatory arthritis.

## 2019-01-28 LAB — CYCLIC CITRUL PEPTIDE ANTIBODY, IGG/IGA: Cyclic Citrullin Peptide Ab: >250 units — ABNORMAL HIGH (ref 0–19)

## 2019-01-28 LAB — SEDIMENTATION RATE: Sed Rate: 34 mm/hr (ref 0–40)

## 2019-01-28 LAB — ANA: Anti Nuclear Antibody (ANA): NEGATIVE

## 2019-01-28 LAB — CMP14 + ANION GAP
ALT: 33 IU/L — ABNORMAL HIGH (ref 0–32)
AST: 29 IU/L (ref 0–40)
Albumin/Globulin Ratio: 1.5 (ref 1.2–2.2)
Albumin: 4.3 g/dL (ref 3.8–4.8)
Alkaline Phosphatase: 208 IU/L — ABNORMAL HIGH (ref 39–117)
Anion Gap: 15 mmol/L (ref 10.0–18.0)
BUN/Creatinine Ratio: 12 (ref 12–28)
BUN: 10 mg/dL (ref 8–27)
Bilirubin Total: 0.8 mg/dL (ref 0.0–1.2)
CO2: 21 mmol/L (ref 20–29)
Calcium: 9.7 mg/dL (ref 8.7–10.3)
Chloride: 104 mmol/L (ref 96–106)
Creatinine, Ser: 0.86 mg/dL (ref 0.57–1.00)
GFR calc Af Amer: 84 mL/min/{1.73_m2} (ref >59)
GFR calc non Af Amer: 73 mL/min/{1.73_m2} (ref >59)
Globulin, Total: 2.9 g/dL (ref 1.5–4.5)
Glucose: 85 mg/dL (ref 65–99)
Potassium: 4.4 mmol/L (ref 3.5–5.2)
Sodium: 140 mmol/L (ref 134–144)
Total Protein: 7.2 g/dL (ref 6.0–8.5)

## 2019-01-28 LAB — C-REACTIVE PROTEIN: CRP: 14 mg/L — ABNORMAL HIGH (ref 0–10)

## 2019-01-28 LAB — RHEUMATOID FACTOR: Rheumatoid fact SerPl-aCnc: 109 IU/mL — ABNORMAL HIGH (ref 0.0–13.9)

## 2019-02-02 ENCOUNTER — Telehealth: Payer: Self-pay | Admitting: Licensed Clinical Social Worker

## 2019-02-02 NOTE — Telephone Encounter (Signed)
Patient contacted to discuss the referral. Pt agreed to services, and will be added to my schedule for 1/6 @ 12:30.

## 2019-02-03 NOTE — Progress Notes (Signed)
Internal Medicine Clinic Attending  Case discussed with Dr. Chundi at the time of the visit.  We reviewed the resident's history and exam and pertinent patient test results.  I agree with the assessment, diagnosis, and plan of care documented in the resident's note. 

## 2019-02-22 ENCOUNTER — Ambulatory Visit: Payer: BC Managed Care – PPO | Admitting: Licensed Clinical Social Worker

## 2019-02-27 ENCOUNTER — Encounter: Payer: BC Managed Care – PPO | Admitting: Internal Medicine

## 2019-02-28 ENCOUNTER — Emergency Department (HOSPITAL_COMMUNITY): Payer: BC Managed Care – PPO

## 2019-02-28 ENCOUNTER — Encounter (HOSPITAL_COMMUNITY): Payer: Self-pay | Admitting: Emergency Medicine

## 2019-02-28 ENCOUNTER — Emergency Department (HOSPITAL_COMMUNITY)
Admission: EM | Admit: 2019-02-28 | Discharge: 2019-02-28 | Disposition: A | Discharge status: Home / Self Care | Payer: BC Managed Care – PPO | Attending: Emergency Medicine | Admitting: Emergency Medicine

## 2019-02-28 DIAGNOSIS — M069 Rheumatoid arthritis, unspecified: Secondary | ICD-10-CM | POA: Insufficient documentation

## 2019-02-28 DIAGNOSIS — Y999 Unspecified external cause status: Secondary | ICD-10-CM | POA: Insufficient documentation

## 2019-02-28 DIAGNOSIS — M545 Low back pain: Secondary | ICD-10-CM | POA: Diagnosis not present

## 2019-02-28 DIAGNOSIS — Y9389 Activity, other specified: Secondary | ICD-10-CM | POA: Diagnosis not present

## 2019-02-28 DIAGNOSIS — W228XXA Striking against or struck by other objects, initial encounter: Secondary | ICD-10-CM | POA: Insufficient documentation

## 2019-02-28 DIAGNOSIS — S79912A Unspecified injury of left hip, initial encounter: Secondary | ICD-10-CM | POA: Diagnosis not present

## 2019-02-28 DIAGNOSIS — Y929 Unspecified place or not applicable: Secondary | ICD-10-CM | POA: Diagnosis not present

## 2019-02-28 DIAGNOSIS — M25552 Pain in left hip: Secondary | ICD-10-CM | POA: Diagnosis not present

## 2019-02-28 DIAGNOSIS — F17210 Nicotine dependence, cigarettes, uncomplicated: Secondary | ICD-10-CM | POA: Insufficient documentation

## 2019-02-28 DIAGNOSIS — M5442 Lumbago with sciatica, left side: Secondary | ICD-10-CM | POA: Diagnosis not present

## 2019-02-28 HISTORY — DX: Unspecified osteoarthritis, unspecified site: M19.90

## 2019-02-28 MED ORDER — HYDROCODONE-ACETAMINOPHEN 5-325 MG PO TABS
1.0000 | ORAL_TABLET | Freq: Once | ORAL | Status: AC
  Administered 2019-02-28: 1 via ORAL
  Filled 2019-02-28: qty 1

## 2019-02-28 MED ORDER — LIDOCAINE 5 % EX PTCH
2.0000 | MEDICATED_PATCH | CUTANEOUS | Status: DC
  Administered 2019-02-28: 2 via TRANSDERMAL
  Filled 2019-02-28: qty 2

## 2019-02-28 NOTE — ED Provider Notes (Signed)
[facility] EMERGENCY DEPARTMENT Provider Note   CSN: [id number] Arrival date & time: 02/28/19  [id number]     History Chief Complaint  Patient presents with  . Hip Pain    Heidi Chapman is a 64 y.o. female with a past medical history significant for RA, GERD, and arthritis who presents to the ED due to worsening left low back pain that radiates into the hip x4 days.  Patient admits to bumping her left hip into the corner of a chair on Saturday and developed severe pain on Sunday morning. Patient notes that the pain radiates both up her back and down her left leg. Pain is worse with movement. Patient denies erythema, warmth, and edema. Patient denies numbness and tingling. Patient admits to heavy lifting daily with her job. She denies saddle paresthesias, bowel/bladder incontinence, lower extremity weakness, IVDU, fever, chills, and history of cancer. She has tried ibuprofen and tylenol with no relief. She denies urinary symptoms.     Past Medical History:  Diagnosis Date  . Arthritis   . GERD (gastroesophageal reflux disease)     Patient Active Problem List   Diagnosis Date Noted  . Anxiety 01/26/2019  . Polyarthralgia 01/26/2019  . Healthcare maintenance 12/06/2017  . Acalculous cholecystitis 10/23/2017    Past Surgical History:  Procedure Laterality Date  . CESAREAN SECTION    . CHOLECYSTECTOMY N/A 10/24/2017   Procedure: LAPAROSCOPIC CHOLECYSTECTOMY WITH INTRAOPERATIVE CHOLANGIOGRAM;  Surgeon: Erroll Luna, MD;  Location: [facility];  Service: General;  Laterality: N/A;     OB History   No obstetric history on file.     No family history on file.  Social History   Tobacco Use  . Smoking status: Current Every Day Smoker    Packs/day: 1.50  . Smokeless tobacco: Never Used  . Tobacco comment: 1-1.5 pks per day   Substance Use Topics  . Alcohol use: Never  . Drug use: Never    Home Medications Prior to Admission medications   Medication Sig Start  Date End Date Taking? Authorizing Provider  acetaminophen (TYLENOL) 325 MG tablet Take 2 tablets (650 mg total) by mouth every 6 (six) hours as needed for mild pain (or Fever >/= 101). 10/25/17   Earnstine Regal, PA-C  bismuth subsalicylate (PEPTO BISMOL) 262 MG chewable tablet Chew 524 mg by mouth every evening.    [provider]  cimetidine (TAGAMET) 200 MG tablet Take 200 mg by mouth 2 (two) times daily.    [provider]  codeine 30 MG tablet Take 1 tablet (30 mg total) by mouth every 6 (six) hours as needed for moderate pain or severe pain (pain not relieved by oral Tylenol). 10/25/17   Earnstine Regal, PA-C  fluconazole (DIFLUCAN) 200 MG tablet Take 1 tablet once if you develop symptoms of a yeast infection. 02/11/18   Fawze, Mina A, PA-C  fluticasone (FLONASE) 50 MCG/ACT nasal spray Place 2 sprays into both nostrils daily. 02/11/18   Fawze, Mina A, PA-C  saccharomyces boulardii (FLORASTOR) 250 MG capsule You can buy this at any drug store, and whichever brand you choose.  I would take for the next 2 weeks.  Follow the package instructions for dose. 10/25/17   Earnstine Regal, PA-C    Allergies    Patient has no known allergies.  Review of Systems   Review of Systems  Constitutional: Negative for chills and fever.  Respiratory: Negative for shortness of breath.   Cardiovascular: Negative for chest pain and leg swelling.  Gastrointestinal: Negative for abdominal pain, diarrhea, nausea and vomiting.  Genitourinary: Negative for difficulty urinating and dysuria.  Musculoskeletal: Positive for arthralgias, back pain, gait problem and myalgias. Negative for joint swelling.  Skin: Negative for color change and rash.  Neurological: Negative for weakness and numbness.    Physical Exam Updated Vital Signs BP 127/78 (BP Location: Left Arm)   Pulse 86   Temp 98 F (36.7 C) (Oral)   Resp 16   SpO2 98%   Physical Exam Vitals and nursing note reviewed.  Constitutional:       General: She is not in acute distress.    Appearance: She is not ill-appearing.     Comments: Appears uncomfortable in chair and with any movement  HENT:     Head: Normocephalic.  Eyes:     Pupils: Pupils are equal, round, and reactive to light.  Neck:     Comments: No cervical midline tenderness Cardiovascular:     Rate and Rhythm: Normal rate and regular rhythm.     Pulses: Normal pulses.     Heart sounds: Normal heart sounds. No murmur. No friction rub. No gallop.   Pulmonary:     Effort: Pulmonary effort is normal.     Breath sounds: Normal breath sounds.  Abdominal:     General: Abdomen is flat. There is no distension.     Palpations: Abdomen is soft.     Tenderness: There is no abdominal tenderness. There is no guarding or rebound.  Musculoskeletal:     Cervical back: Neck supple.     Comments: No T-spine and L-spine midline tenderness, no stepoff or deformity, reproducible left lumbar paraspinal tenderness No leg edema bilaterally Positive left straight leg test Patient moves all extremities with mild difficulty on left lower extremity due to pain DP/PT pulses 2+ and equal bilaterally Sensation grossly intact bilaterally Strength of knee flexion and extension is 5/5 Plantar and dorsiflexion of ankle 5/5 Achilles and patellar reflexes present and equal Able to ambulate with limp  Tenderness to palpation over left hip and left buttock region. No erythema, edema, or warmth. No crepitus, stepoff, or deformity.    Skin:    General: Skin is warm and dry.     Findings: No erythema or rash.  Neurological:     General: No focal deficit present.     Mental Status: She is alert.     ED Results / Procedures / Treatments   Labs (all labs ordered are listed, but only abnormal results are displayed) Labs Reviewed - No data to display  EKG None  Radiology DG Hip Unilat With Pelvis 2-3 Views Left  Result Date: 02/28/2019 CLINICAL DATA:  Pain at left lateral hip  after injury. EXAM: DG HIP (WITH OR WITHOUT PELVIS) 2-3V LEFT COMPARISON:  None. FINDINGS: There is no evidence of hip fracture or dislocation. No acute soft tissue finding. Postoperative pelvis. IMPRESSION: Negative. Electronically Signed   By: Monte Fantasia M.D.   On: 02/28/2019 09:57    Procedures Procedures (including critical care time)  Medications Ordered in ED Medications  lidocaine (LIDODERM) 5 % 2 patch (2 patches Transdermal Patch Applied 02/28/19 1143)  HYDROcodone-acetaminophen (NORCO/VICODIN) 5-325 MG per tablet 1 tablet (1 tablet Oral Given 02/28/19 1141)    ED Course  I have reviewed the triage vital signs and the nursing notes.  Pertinent labs & imaging results that were available during my care of the patient were reviewed by me and considered in my medical decision making (see chart  for details).    MDM Rules/Calculators/A&P                     64 year old female presents to the ED due to worsening left low back and hip pain x4 days.  Patient admits to hitting her left hip on the corner of a chair. Patient denies fever and chills.  Vitals all within normal limits.  Patient is afebrile, not tachycardic or hypoxic.  Patient in no acute distress and nonill appearing, but appears uncomfortable with any movement.  Physical exam reassuring with no cervical, thoracic, or lumbar midline tenderness.  Tenderness palpation the left lumbar paraspinal region into the left hip and buttocks.  No erythema, edema, or warmth.  Doubt septic arthritis. Patient able to ambulate in the ED with a mild limp.  Lower extremities neurovascularly intact.  History without red flags such as cancer, IV drug use, weakness, saddle paresthesias, bowel/bladder incontinence, and lower extremity edema.   Hip x-ray ordered at triage and personally reviewed which is negative for bony fractures. Suspect low back muscle strain vs. Left hip pain from injury. Doubt cauda equina or disc herniation due to lack of saddle  anesthesia/bowel or bladder incontinence or urinary retention, normal gait and reassuring physical examination without neurologic deficits.   Will manage patient conservatively at this time with NSAIDs, back exercises/stretches, heat therapy and follow up with PCP if symptoms do not resolve in 3-4 weeks. Patient offered muscle relaxer for comfort at night. Counseled on need to return to ED for fever, worsening or concerning symptoms. Strict ED precautions discussed with patient. Patient states understanding and agrees to plan. Patient discharged home in no acute distress and stable vitals.  Final Clinical Impression(s) / ED Diagnoses Final diagnoses:  Acute left-sided low back pain with left-sided sciatica  Left hip pain    Rx / DC Orders ED Discharge Orders    None       Suzy Bouchard, PA-C 02/28/19 1213    Lennice Sites, DO 02/28/19 1222

## 2019-02-28 NOTE — Discharge Instructions (Addendum)
As discussed, your hip x-ray was normal today with no broken bones. I suspect you either strained a muscle in your low back or hurt your hip when you hit the corner of a chair. I am sending you home with naproxen as needed for pain. You can take twice a day for pain. Do not mix with other over the counter medications. I am also sending you home with a muscle relaxer called Robaxin. You can take twice a day, but medicine causes drowsiness so do not drive or operate machinery while on the medication. You can also purchase over the counter Lidoderm patches and Voltaren gel to place in the area of pain. If your symptoms do not improve within the next week, follow-up with your PCP. Return to the ER if you develop a fever, issues going to the bathroom, or worsening pain.

## 2019-02-28 NOTE — ED Triage Notes (Signed)
Pt reports left hip pain since Saturday denies any known injury but did recently get diagnosed with RA in hands. Pt states at times the pain "shoots across back or down her leg" Denies any numbness or tingling. Pt states she can walk but increase in pain with walking.

## 2019-03-13 ENCOUNTER — Encounter: Payer: Self-pay | Admitting: Internal Medicine

## 2019-03-13 ENCOUNTER — Ambulatory Visit: Payer: BC Managed Care – PPO | Admitting: Internal Medicine

## 2019-03-13 VITALS — BP 117/78 | HR 83 | Temp 98.4°F | Wt 181.7 lb

## 2019-03-13 DIAGNOSIS — M545 Low back pain, unspecified: Secondary | ICD-10-CM

## 2019-03-13 DIAGNOSIS — M0579 Rheumatoid arthritis with rheumatoid factor of multiple sites without organ or systems involvement: Secondary | ICD-10-CM | POA: Diagnosis not present

## 2019-03-13 HISTORY — DX: Low back pain, unspecified: M54.50

## 2019-03-13 MED ORDER — CYCLOBENZAPRINE HCL 10 MG PO TABS: 10.0000 mg | ORAL_TABLET | Freq: Three times a day (TID) | ORAL | 0 refills | Status: DC | PRN

## 2019-03-13 MED ORDER — METHYLPREDNISOLONE 4 MG PO TBPK: ORAL_TABLET | ORAL | 0 refills | Status: DC

## 2019-03-13 NOTE — Assessment & Plan Note (Signed)
Patient presents with 2 week history of low back pain that began after she moved some boxes in her house. She recalls bending over that night to put her slippers on when the pain suddenly came on. Pain is worse with flexion/extension, prolonged sitting or standing. Feels most relief lying down at night. It does not prevent her from sleeping. She has been taking scheduled Ibuprofen, alternating ice/heat, and performing home exercises with only mild relief. She was evaluated in the ED 2 weeks ago and had negative hip x-ray.  Given her history of RA, would like to have plain films of low back to rule out fracture  Patient's work schedule does not allow time for regular PT visits so will hold off for now. Try 5 day course of steroids to see if this improves acute inflammation. Will provide muscle relaxant prn for back spasms.

## 2019-03-13 NOTE — Progress Notes (Signed)
   CC: back pain, RA  HPI:  Ms.Vaanya Kalbfleisch is a 64 y.o. female with history of newly diagnosed RA who presents for acute onset low back pain and requesting referral to rheumatologist for RA. Please see encounters tab for problem based charting.   Past Medical History:  Diagnosis Date  . Arthritis   . GERD (gastroesophageal reflux disease)    Review of Systems:  Review of Systems  Constitutional: Negative for chills, fever and weight loss.  HENT: Negative for congestion and sore throat.   Eyes: Negative for blurred vision.  Respiratory: Negative for shortness of breath.   Cardiovascular: Negative for chest pain and palpitations.  Gastrointestinal: Negative for heartburn, nausea and vomiting.  Genitourinary: Negative for urgency.  Musculoskeletal: Positive for back pain, joint pain and myalgias. Negative for falls and neck pain.  Neurological: Negative for dizziness, sensory change, focal weakness and headaches.  Psychiatric/Behavioral: Negative for depression. The patient is nervous/anxious.      Physical Exam:  Vitals:   03/13/19 1451  BP: 117/78  Pulse: 83  Temp: 98.4 F (36.9 C)  TempSrc: Oral  SpO2: 96%  Weight: 181 lb 11.2 oz (82.4 kg)   Physical Exam Constitutional:      General: She is not in acute distress.    Appearance: Normal appearance.  Eyes:     Conjunctiva/sclera: Conjunctivae normal.  Pulmonary:     Effort: Pulmonary effort is normal.  Abdominal:     General: There is no distension.     Palpations: Abdomen is soft.  Musculoskeletal:     Cervical back: Normal range of motion and neck supple.     Lumbar back: Spasms and tenderness present. No swelling, deformity or bony tenderness. Decreased range of motion. Positive left straight leg raise test.     Left hip: No bony tenderness. Decreased range of motion.     Comments: Also with tenderness to palpation along IT band and ileopsoas.   Skin:    General: Skin is warm and dry.  Neurological:   Mental Status: She is alert and oriented to person, place, and time.     Sensory: Sensation is intact.     Motor: Motor function is intact.     Gait: Gait is intact.     Deep Tendon Reflexes:     Reflex Scores:      Patellar reflexes are 2+ on the right side and 2+ on the left side.     Assessment & Plan:   See Encounters Tab for problem based charting.  Patient discussed with Dr. Rebeca Alert

## 2019-03-13 NOTE — Assessment & Plan Note (Signed)
Patient was seen in clinic approximately 1 month ago for joint pain. Lab work consistent with rheumatoid arthritis. Referral to rheumatology placed today.

## 2019-03-13 NOTE — Patient Instructions (Signed)
[name], It was a pleasure meeting you. I'm so sorry for this back pain you're having. You are doing all the right things with the home exercises, taking scheduled anti-inflammatories (try to stick to 800 mg or less with Ibuprofen), and alternating with ice/heat. We will try a 5 day steroid course to see if this gets you any relief. If not, let me know and I can call in a different prescription anti-inflammatory. I'm also going to send in a muscle relaxant for you to try if you would like.   For your rheumatoid arthritis, I am placing a referral to get in with rheumatology.   We will start working on health screenings after your work settles down.   Take care, Dr. Koleen Distance

## 2019-03-14 NOTE — Progress Notes (Signed)
Internal Medicine Clinic Attending  Case discussed with Dr. Bloomfield.  We reviewed the resident's history and exam and pertinent patient test results.  I agree with the assessment, diagnosis, and plan of care documented in the resident's note.  Oshen Wlodarczyk, M.D., Ph.D.  

## 2019-03-23 NOTE — Addendum Note (Signed)
Addended by: Hulan Fray on: 03/23/2019 05:55 PM   Modules accepted: Orders

## 2019-03-27 DIAGNOSIS — M2550 Pain in unspecified joint: Secondary | ICD-10-CM | POA: Diagnosis not present

## 2019-03-27 DIAGNOSIS — R5383 Other fatigue: Secondary | ICD-10-CM | POA: Diagnosis not present

## 2019-03-27 DIAGNOSIS — M0579 Rheumatoid arthritis with rheumatoid factor of multiple sites without organ or systems involvement: Secondary | ICD-10-CM | POA: Diagnosis not present

## 2019-03-27 DIAGNOSIS — M7989 Other specified soft tissue disorders: Secondary | ICD-10-CM | POA: Diagnosis not present

## 2019-03-27 DIAGNOSIS — Z111 Encounter for screening for respiratory tuberculosis: Secondary | ICD-10-CM | POA: Diagnosis not present

## 2019-03-27 DIAGNOSIS — R748 Abnormal levels of other serum enzymes: Secondary | ICD-10-CM | POA: Diagnosis not present

## 2019-05-09 DIAGNOSIS — M0579 Rheumatoid arthritis with rheumatoid factor of multiple sites without organ or systems involvement: Secondary | ICD-10-CM | POA: Diagnosis not present

## 2019-05-09 DIAGNOSIS — M2550 Pain in unspecified joint: Secondary | ICD-10-CM | POA: Diagnosis not present

## 2019-05-13 ENCOUNTER — Ambulatory Visit: Payer: BC Managed Care – PPO | Attending: Internal Medicine

## 2019-05-13 DIAGNOSIS — Z23 Encounter for immunization: Secondary | ICD-10-CM

## 2019-05-13 NOTE — Progress Notes (Signed)
   Covid-19 Vaccination Clinic  Name:  Heidi Chapman    MRN: [id number] DOB: [birth year]  05/13/2019  [name] was observed post Covid-19 immunization for 15 minutes without incident. She was provided with Vaccine Information Sheet and instruction to access the V-Safe system.   [name] was instructed to call 911 with any severe reactions post vaccine: Marland Kitchen Difficulty breathing  . Swelling of face and throat  . A fast heartbeat  . A bad rash all over body  . Dizziness and weakness   Immunizations Administered    Name Date Dose VIS Date Route   Pfizer COVID-19 Vaccine 05/13/2019  1:32 PM 0.3 mL 01/27/2019 Intramuscular   Manufacturer: [place]   Lot: [id number]   [place]: [id number]

## 2019-06-07 ENCOUNTER — Ambulatory Visit: Payer: BC Managed Care – PPO | Attending: Internal Medicine

## 2019-06-07 DIAGNOSIS — Z23 Encounter for immunization: Secondary | ICD-10-CM

## 2019-06-07 NOTE — Progress Notes (Signed)
   Covid-19 Vaccination Clinic  Name:  Heidi Chapman    MRN: [id number] DOB: [birth year]  06/07/2019  [name] was observed post Covid-19 immunization for 15 minutes without incident. She was provided with Vaccine Information Sheet and instruction to access the V-Safe system.   [name] was instructed to call 911 with any severe reactions post vaccine: Marland Kitchen Difficulty breathing  . Swelling of face and throat  . A fast heartbeat  . A bad rash all over body  . Dizziness and weakness   Immunizations Administered    Name Date Dose VIS Date Route   Pfizer COVID-19 Vaccine 06/07/2019  8:51 AM 0.3 mL 04/12/2018 Intramuscular   Manufacturer: [place]   Lot: [id number]   [place]: [id number]

## 2019-07-24 DIAGNOSIS — R748 Abnormal levels of other serum enzymes: Secondary | ICD-10-CM | POA: Diagnosis not present

## 2019-07-24 DIAGNOSIS — M2550 Pain in unspecified joint: Secondary | ICD-10-CM | POA: Diagnosis not present

## 2019-07-24 DIAGNOSIS — M0579 Rheumatoid arthritis with rheumatoid factor of multiple sites without organ or systems involvement: Secondary | ICD-10-CM | POA: Diagnosis not present

## 2019-07-24 DIAGNOSIS — M7989 Other specified soft tissue disorders: Secondary | ICD-10-CM | POA: Diagnosis not present

## 2019-09-26 DIAGNOSIS — R748 Abnormal levels of other serum enzymes: Secondary | ICD-10-CM | POA: Diagnosis not present

## 2019-09-26 DIAGNOSIS — M2550 Pain in unspecified joint: Secondary | ICD-10-CM | POA: Diagnosis not present

## 2019-09-26 DIAGNOSIS — M0579 Rheumatoid arthritis with rheumatoid factor of multiple sites without organ or systems involvement: Secondary | ICD-10-CM | POA: Diagnosis not present

## 2019-09-26 DIAGNOSIS — M7989 Other specified soft tissue disorders: Secondary | ICD-10-CM | POA: Diagnosis not present

## 2019-11-01 ENCOUNTER — Encounter: Payer: Self-pay | Admitting: Student

## 2019-11-01 ENCOUNTER — Other Ambulatory Visit: Payer: Self-pay

## 2019-11-01 ENCOUNTER — Ambulatory Visit (INDEPENDENT_AMBULATORY_CARE_PROVIDER_SITE_OTHER): Payer: BC Managed Care – PPO | Admitting: Student

## 2019-11-01 ENCOUNTER — Telehealth: Payer: Self-pay | Admitting: *Deleted

## 2019-11-01 DIAGNOSIS — Z20822 Contact with and (suspected) exposure to covid-19: Secondary | ICD-10-CM | POA: Diagnosis not present

## 2019-11-01 DIAGNOSIS — J189 Pneumonia, unspecified organism: Secondary | ICD-10-CM | POA: Insufficient documentation

## 2019-11-01 NOTE — Telephone Encounter (Signed)
Patient called in. States 3 day hx of sneezing, coughing, no taste or smell. Fever yesterday 102, but none today. Had Covid test today at 1100 at [facility]. Unsure of when she will receive results. Would like to speak with Provider. Placed on Blue Team's schedule today at 3:30. Hubbard Hartshorn, BSN, RN-BC

## 2019-11-01 NOTE — Progress Notes (Signed)
  [facility] Internal Medicine Residency Telephone Encounter Continuity Care Appointment  HPI:   This telephone encounter was created for Heidi Chapman on 11/01/2019 for the following purpose/cc: COVID-like symptoms.  Heidi Chapman is a 64 year old vaccinated woman with seasonal allergies and recent diagnosis of Rheumatoid arthritis (on methotrexate and etanercept).  The patient reports she was in her usual state of health until 3 days ago on Sunday (10/29/19) morning when she woke up with nasal congestion, sore throat, cough, difficulty breathing, sneezing, loss of taste and smell. Temperature was 102. She reports she took OTC cold medicine/decongestant, which helped somewhat, however her symptoms have persisted, leading her to go to [facility] to obtain COVID testing earlier this morning at 11 am. She notes that she initially attributed her symptoms to allergies and presented to the pharmacy on Monday to get her 3rd COVID vaccine. In the meantime, she has been trying to stay hydrated with adequate fluids and resting. She notes that her appetite has been poor, but she is trying to eat bland foods. She notes that her shortness of breath is mainly with exertion and not at rest. She does not have a home pulse ox. She has not had a fever since Sunday, however she is taking OTC medicines which contain acetaminophen. She denies GI symptoms such as diarrhea, N/V. She called our office after obtaining her COVID test to speak with a provider regarding her symptoms.  She has not had COVID previously. She works from home and lives with her daughter and 47-year-old granddaughter, all of whom have similar symptoms. Her daughter is also vaccinated against COVID. She has no known COVID-positive contacts. She reports the whole family has been careful with mask wearing and maintaining social distance, especially in light of her RA diagnosis. She requests a letter saying she has been evaluated by her doctor.    Past  Medical History:  Past Medical History:  Diagnosis Date  . Arthritis   . GERD (gastroesophageal reflux disease)    Rheumatoid arthritis: follows with rheumatologist. On methotrexate and just started etanercept.   ROS:  Review of Systems  Constitutional: Positive for chills, fever and malaise/fatigue.  HENT: Positive for congestion and sore throat.   Respiratory: Positive for cough and shortness of breath.   Gastrointestinal: Negative for diarrhea, nausea and vomiting.  Neurological: Negative for dizziness.     Assessment / Plan / Recommendations:   Please see A&P under problem oriented charting for assessment of the patient's acute and chronic medical conditions.   As always, pt is advised that if symptoms worsen or new symptoms arise, they should go to an urgent care facility or to to ER for further evaluation.   Consent and Medical Decision Making:   Patient seen with Dr. Daryll Drown  This is a telephone encounter between [facility] and Heidi Chapman on 11/01/2019 for COVID-like symptoms. The visit was conducted with the patient located at home and Heidi Chapman at [facility]. The patient's identity was confirmed using their DOB and current address. The patient has consented to being evaluated through a telephone encounter and understands the associated risks (an examination cannot be done and the patient may need to come in for an appointment) / benefits (allows the patient to remain at home, decreasing exposure to coronavirus). I personally spent 20 minutes on medical discussion.

## 2019-11-01 NOTE — Assessment & Plan Note (Signed)
Assessment: This is day 4 of the patient's symptoms. The patient's symptoms of fever, cough, dyspnea, sore throat, loss of taste and smell are concerning for possible COVID-19 infection. Given her treatment with methotrexate and etanercept, she is immunocompromised and so is at greater risk for breakthrough infection despite being vaccinated. Her symptoms are also possibly due to other viral etiologies such as influenza.   Plan: - Instructed patient to call the clinic when she has her COVID-19 test result - If the patient tests positive, she would be a candidate for outpatient monoclonal antibody treatment - Continue supportive care with hydration, rest, and OTC medicines as needed - Counseled the patient regarding return precautions - Will mail letter to patient which states she has been evaluated and may return to work on Monday, 9/20, depending on the result of her test and her clinical progress

## 2019-11-02 NOTE — Progress Notes (Signed)
Internal Medicine Clinic Attending  I spoke with and evaluated the patient.  I personally confirmed the key portions of the history documented by Dr. Shon Baton and I reviewed pertinent patient test results.  The assessment, diagnosis, and plan were formulated together and I agree with the documentation in the resident's note.

## 2019-11-06 ENCOUNTER — Ambulatory Visit (INDEPENDENT_AMBULATORY_CARE_PROVIDER_SITE_OTHER): Payer: BC Managed Care – PPO | Admitting: Internal Medicine

## 2019-11-06 ENCOUNTER — Other Ambulatory Visit: Payer: Self-pay

## 2019-11-06 DIAGNOSIS — Z20822 Contact with and (suspected) exposure to covid-19: Secondary | ICD-10-CM | POA: Diagnosis not present

## 2019-11-06 NOTE — Progress Notes (Signed)
  [facility] Internal Medicine Residency Telephone Encounter Continuity Care Appointment  HPI:   This telephone encounter was created for [name] on 11/06/2019 for the following purpose/cc FU on COVID like SYMPTOMS.   Past Medical History:  Past Medical History:  Diagnosis Date  . Arthritis   . GERD (gastroesophageal reflux disease)       ROS:  Review of Systems  Constitutional: Positive for chills, fever and malaise/fatigue.  HENT: Positive for congestion and sinus pain. Negative for ear discharge, ear pain, nosebleeds and sore throat.   Respiratory: Positive for cough and sputum production. Negative for hemoptysis, shortness of breath, wheezing and stridor.   Cardiovascular: Negative for chest pain, palpitations and orthopnea.  Gastrointestinal: Positive for nausea. Negative for abdominal pain, constipation, diarrhea and vomiting.  Neurological: Positive for headaches.     Assessment / Plan / Recommendations:   Please see A&P under problem oriented charting for assessment of the patient's acute and chronic medical conditions.   As always, pt is advised that if symptoms worsen or new symptoms arise, they should go to an urgent care facility or to to ER for further evaluation.   Consent and Medical Decision Making:   Patient discussed with Dr. Angelia Mould  This is a telephone encounter between [facility] and Heidi Chapman on 11/06/2019 for COVID like symptoms. The visit was conducted with the patient located at home and Heidi Chapman at [facility]. The patient's identity was confirmed using their DOB and current address. The patient has consented to being evaluated through a telephone encounter and understands the associated risks (an examination cannot be done and the patient may need to come in for an appointment) / benefits (allows the patient to remain at home, decreasing exposure to coronavirus). I personally spent 15 minutes on medical discussion.

## 2019-11-07 ENCOUNTER — Encounter: Payer: Self-pay | Admitting: Internal Medicine

## 2019-11-07 MED ORDER — GUAIFENESIN ER 600 MG PO TB12: 600.0000 mg | ORAL_TABLET | Freq: Two times a day (BID) | ORAL | 0 refills | Status: DC

## 2019-11-07 NOTE — Assessment & Plan Note (Addendum)
Patient presents for telehealth visit for follow up on her Covid Like symptoms. Per the patient, she took the 3 day COVID test and was negative, she is vaccinated x2 with a booster.  She endorses having some fevers with temperatures at 99.8 F. She states that last week her temperatures were consistently 103 F. She also endorses chest congestion and nasal congestion with cough productive for thick mucus. Her only medication she is currently taking is DayQuil. We discussed continued supportive measures. We discussed that her fever hs broken, which is a reassuring sign. Continued to stress supportive measures at this time. She is vaccinated against covid and has had a booster, does not need to have further covid testing per CDC guidelines. She does need a note for work given that she is still ill.  - Continue supportive measures, staying hydrated, continuing her Day Quil  - Mucinex ER 600 mg twice daily for secretions.

## 2019-11-09 NOTE — Progress Notes (Signed)
Internal Medicine Clinic Attending ? ?Case discussed with Dr. Winters  At the time of the visit.  We reviewed the resident?s history and exam and pertinent patient test results.  I agree with the assessment, diagnosis, and plan of care documented in the resident?s note.  ?

## 2019-11-10 DIAGNOSIS — H5203 Hypermetropia, bilateral: Secondary | ICD-10-CM | POA: Diagnosis not present

## 2019-11-10 DIAGNOSIS — H524 Presbyopia: Secondary | ICD-10-CM | POA: Diagnosis not present

## 2019-11-10 DIAGNOSIS — H43813 Vitreous degeneration, bilateral: Secondary | ICD-10-CM | POA: Diagnosis not present

## 2019-11-10 DIAGNOSIS — H2513 Age-related nuclear cataract, bilateral: Secondary | ICD-10-CM | POA: Diagnosis not present

## 2019-11-14 ENCOUNTER — Encounter: Payer: Self-pay | Admitting: Student

## 2019-11-14 ENCOUNTER — Ambulatory Visit (INDEPENDENT_AMBULATORY_CARE_PROVIDER_SITE_OTHER): Payer: BC Managed Care – PPO | Admitting: Student

## 2019-11-14 ENCOUNTER — Other Ambulatory Visit: Payer: Self-pay

## 2019-11-14 DIAGNOSIS — R05 Cough: Secondary | ICD-10-CM | POA: Diagnosis not present

## 2019-11-14 DIAGNOSIS — R509 Fever, unspecified: Secondary | ICD-10-CM | POA: Diagnosis not present

## 2019-11-14 DIAGNOSIS — R059 Cough, unspecified: Secondary | ICD-10-CM

## 2019-11-14 NOTE — Assessment & Plan Note (Addendum)
  The patient states that she continues to feel poorly. She reports malaise and persistent cough, now associated with post-tussive emesis. Cough is productive of green sputum. Reports persistent congestion now associated with bilateral sinus pressure and pain, including gum tenderness, however no rhinorrhea. She has felt dizzy and lightheaded at times but denies falling or LOC. She bought a home pulse oximeter and has averaged oxygen saturation ~94%. She has been febrile to 100.5 for the last few days. She notes she has been trying to stay hydrated, drinking ~2 cans of soda (Diet Coke, [facility]) daily and ~8 oz of water. Appetite is still low, and she has no sense of taste or smell.   Reports a remote history of sinus infections and more recent history of pneumonia. Has a 20-pack-year smoking history (smoked 1/2 ppd for 40 years), but hasn't smoked for the last 2 weeks secondary to feeling poorly.   Sinuses feel like filled with sludge. Gums hurt. Dizzy. No appetite, no sense of smell. Coughing. Mucinex. Coughing and projectile vomiting. Difficulty breathing.   Assessment: This is day 57 of this patient's illness which has worsened despite supportive measures. She tested negative for COVID on 10/30/19. She has upper and lower respiratory symptoms, including congestion, anosmia, sinus pain and pressure, dental pain, productive cough, shortness of breath. These symptoms and reported fever and malaise are concerning for acute bacterial sinus infection and/or community acquired pneumonia. She is certainly at risk given her 20-pack-year smoking history. Our choice of antibiotic treatment will depend on whether we are treating for sinusitis vs CAP, so I have asked the patient if she would be able to come in today or tomorrow for in-person evaluation. She states she will be able to come in tomorrow, 11/15/19.  Plan: - Patient is scheduled for in-person evaluation at [facility] tomorrow, 11/15/19, at 9:30 am

## 2019-11-14 NOTE — Progress Notes (Signed)
I discussed this patient with Dr. Shon Baton and together we placed return call to review the current assessment and plan.  I agree with Dr. Philip Aspen documentation.

## 2019-11-14 NOTE — Progress Notes (Signed)
  [facility] Internal Medicine Residency Telephone Encounter Continuity Care Appointment  HPI:   This telephone encounter was created for [name] on 11/14/2019 for the following purpose/cc: follow-up of cough and congestion   Past Medical History:  Past Medical History:  Diagnosis Date  . Arthritis   . GERD (gastroesophageal reflux disease)    Rheumatoid arthritis  Seasonal allergies   ROS:  Review of Systems  Constitutional: Positive for fever and malaise/fatigue. Negative for chills.  HENT: Positive for congestion and sinus pain. Negative for sore throat.   Respiratory: Positive for cough, sputum production and shortness of breath.   Cardiovascular: Negative for chest pain.  Gastrointestinal: Positive for vomiting. Negative for abdominal pain, diarrhea and nausea.  Genitourinary: Negative for dysuria.  Musculoskeletal: Negative for myalgias.  Skin: Negative for rash.  Neurological: Positive for dizziness. Negative for focal weakness.     Assessment / Plan / Recommendations:   Please see A&P under problem oriented charting for assessment of the patient's acute and chronic medical conditions.   As always, pt is advised that if symptoms worsen or new symptoms arise, they should go to an urgent care facility or to to ER for further evaluation.   Consent and Medical Decision Making:   Patient seen with Dr. Jimmye Norman  This is a telephone encounter between [facility] and [facility] on 11/14/2019 for follow-up of fever, cough, and congestion. The visit was conducted with the patient located at in her car in parking lot and [place] [facility] at [facility]. The patient's identity was confirmed using their DOB and current address. The patient has consented to being evaluated through a telephone encounter and understands the associated risks (an examination cannot be done and the patient may need to come in for an appointment) / benefits (allows the patient to remain at home,  decreasing exposure to coronavirus). I personally spent 15 minutes on medical discussion.

## 2019-11-15 ENCOUNTER — Ambulatory Visit: Payer: BC Managed Care – PPO | Admitting: Student

## 2019-11-15 ENCOUNTER — Encounter: Payer: Self-pay | Admitting: Student

## 2019-11-15 VITALS — BP 112/73 | HR 93 | Temp 98.2°F | Wt 175.4 lb

## 2019-11-15 DIAGNOSIS — J189 Pneumonia, unspecified organism: Secondary | ICD-10-CM | POA: Diagnosis not present

## 2019-11-15 DIAGNOSIS — J0100 Acute maxillary sinusitis, unspecified: Secondary | ICD-10-CM | POA: Insufficient documentation

## 2019-11-15 DIAGNOSIS — R059 Cough, unspecified: Secondary | ICD-10-CM

## 2019-11-15 DIAGNOSIS — R509 Fever, unspecified: Secondary | ICD-10-CM

## 2019-11-15 MED ORDER — DOXYCYCLINE MONOHYDRATE 100 MG PO TABS: 100.0000 mg | ORAL_TABLET | Freq: Two times a day (BID) | ORAL | 0 refills | Status: AC

## 2019-11-15 NOTE — Progress Notes (Signed)
   CC: cough, congestion, shortness of breath  HPI:  [name] is a 64 year old COVID-19 vaccinated woman (s/p Pfizer booster on 10/30/19) with seasonal allergies and recent diagnosis of Rheumatoid arthritis (on methotrexate and etanercept) who presents to clinic for evaluation of reported fever, productive cough, congestion, and shortness of breath. She was evaluated via telehealth on 11/01/19, 11/06/19, and 11/14/19, and after yesterday's appointment was advised to present in-person for evaluation given her persistent and worsening symptoms.   To see the details of this patient's management of their acute and chronic problems, please refer to the Assessment & Plan under the Encounters tab.    Past Medical History:  Diagnosis Date  . Arthritis   . GERD (gastroesophageal reflux disease)    Review of Systems:    Constitutional: Positive for malaise/fatigue. Negative for chills.  HENT: Positive for congestion and sinus pain. Negative for sore throat.   Respiratory: Positive for cough, sputum production and shortness of breath.   Cardiovascular: Negative for chest pain.  Gastrointestinal: Negative for abdominal pain, diarrhea and nausea.  Genitourinary: Negative for dysuria.  Musculoskeletal: Negative for myalgias.  Skin: Negative for rash.  Neurological: Positive for dizziness. Negative for focal weakness.  Physical Exam:  Vitals:   11/15/19 0932  BP: 112/73  Pulse: 93  Temp: 98.2 F (36.8 C)  TempSrc: Oral  SpO2: 94%  Weight: 175 lb 6.4 oz (79.6 kg)   Constitutional: tired- and uncomfortable-appearing woman sitting in chair, in mild acute distress HENT: normocephalic atraumatic, mucous membranes moist, endorses worsening of maxillary sinus pressure when leans head down Eyes: conjunctiva non-erythematous Neck: supple, no cervical lymphadenopathy Cardiovascular: regular rate and rhythm, no m/r/g Pulmonary/Chest: normal work of breathing on room air, clear to auscultation  throughout except for crackles over R lower lobe, no wheezing  Abdominal: soft, non-tender, non-distended MSK: normal bulk and tone Neurological: alert & oriented x 3, normal gait   Assessment & Plan:   See Encounters Tab for problem based charting.  Patient seen with Dr. Jimmye Norman

## 2019-11-15 NOTE — Patient Instructions (Signed)
Ms. Brossard,   Thank you for your visit to the [facility] today. It was a pleasure meeting you in person. I'm sorry you've been feeling so poorly. Based on your symptoms and physical exam, we will treat you for acute sinusitis and community acquired pneumonia.  - I have sent the following antibiotic prescription to your pharmacy: doxycycline 100 mg twice daily for 7 days - Recommend OTC decongestant and cough medicines - Recommend a limited course of Afrin: 2 to 3 sprays into each nostril twice daily for ?3 days (maximum dose: 2 doses/24 hours).   We would like to see you back in 1 week to see how you are doing.    If you have any questions or concerns in the meantime, please call our clinic at [phone] between 9am-5pm. Outside of these hours, call [phone] and ask for the internal medicine resident on call. If you feel you are having a medical emergency please call 911.    Sinusitis, Adult Sinusitis is inflammation of your sinuses. Sinuses are hollow spaces in the bones around your face. Your sinuses are located:  Around your eyes.  In the middle of your forehead.  Behind your nose.  In your cheekbones. Mucus normally drains out of your sinuses. When your nasal tissues become inflamed or swollen, mucus can become trapped or blocked. This allows bacteria, viruses, and fungi to grow, which leads to infection. Most infections of the sinuses are caused by a virus. Sinusitis can develop quickly. It can last for up to 4 weeks (acute) or for more than 12 weeks (chronic). Sinusitis often develops after a cold. What are the causes? This condition is caused by anything that creates swelling in the sinuses or stops mucus from draining. This includes:  Allergies.  Asthma.  Infection from bacteria or viruses.  Deformities or blockages in your nose or sinuses.  Abnormal growths in the nose (nasal polyps).  Pollutants, such as chemicals or irritants in the  air.  Infection from fungi (rare). What increases the risk? You are more likely to develop this condition if you:  Have a weak body defense system (immune system).  Do a lot of swimming or diving.  Overuse nasal sprays.  Smoke. What are the signs or symptoms? The main symptoms of this condition are pain and a feeling of pressure around the affected sinuses. Other symptoms include:  Stuffy nose or congestion.  Thick drainage from your nose.  Swelling and warmth over the affected sinuses.  Headache.  Upper toothache.  A cough that may get worse at night.  Extra mucus that collects in the throat or the back of the nose (postnasal drip).  Decreased sense of smell and taste.  Fatigue.  A fever.  Sore throat.  Bad breath. How is this diagnosed? This condition is diagnosed based on:  Your symptoms.  Your medical history.  A physical exam.  Tests to find out if your condition is acute or chronic. This may include: ? Checking your nose for nasal polyps. ? Viewing your sinuses using a device that has a light (endoscope). ? Testing for allergies or bacteria. ? Imaging tests, such as an MRI or CT scan. In rare cases, a bone biopsy may be done to rule out more serious types of fungal sinus disease. How is this treated? Treatment for sinusitis depends on the cause and whether your condition is chronic or acute.  If caused by a virus, your symptoms should go away on their own within  10 days. You may be given medicines to relieve symptoms. They include: ? Medicines that shrink swollen nasal passages (topical intranasal decongestants). ? Medicines that treat allergies (antihistamines). ? A spray that eases inflammation of the nostrils (topical intranasal corticosteroids). ? Rinses that help get rid of thick mucus in your nose (nasal saline washes).  If caused by bacteria, your health care provider may recommend waiting to see if your symptoms improve. Most bacterial  infections will get better without antibiotic medicine. You may be given antibiotics if you have: ? A severe infection. ? A weak immune system.  If caused by narrow nasal passages or nasal polyps, you may need to have surgery. Follow these instructions at home: Medicines  Take, use, or apply over-the-counter and prescription medicines only as told by your health care provider. These may include nasal sprays.  If you were prescribed an antibiotic medicine, take it as told by your health care provider. Do not stop taking the antibiotic even if you start to feel better. Hydrate and humidify   Drink enough fluid to keep your urine pale yellow. Staying hydrated will help to thin your mucus.  Use a cool mist humidifier to keep the humidity level in your home above 50%.  Inhale steam for 10-15 minutes, 3-4 times a day, or as told by your health care provider. You can do this in the bathroom while a hot shower is running.  Limit your exposure to cool or dry air. Rest  Rest as much as possible.  Sleep with your head raised (elevated).  Make sure you get enough sleep each night. General instructions   Apply a warm, moist washcloth to your face 3-4 times a day or as told by your health care provider. This will help with discomfort.  Wash your hands often with soap and water to reduce your exposure to germs. If soap and water are not available, use hand sanitizer.  Do not smoke. Avoid being around people who are smoking (secondhand smoke).  Keep all follow-up visits as told by your health care provider. This is important. Contact a health care provider if:  You have a fever.  Your symptoms get worse.  Your symptoms do not improve within 10 days. Get help right away if:  You have a severe headache.  You have persistent vomiting.  You have severe pain or swelling around your face or eyes.  You have vision problems.  You develop confusion.  Your neck is stiff.  You have  trouble breathing. Summary  Sinusitis is soreness and inflammation of your sinuses. Sinuses are hollow spaces in the bones around your face.  This condition is caused by nasal tissues that become inflamed or swollen. The swelling traps or blocks the flow of mucus. This allows bacteria, viruses, and fungi to grow, which leads to infection.  If you were prescribed an antibiotic medicine, take it as told by your health care provider. Do not stop taking the antibiotic even if you start to feel better.  Keep all follow-up visits as told by your health care provider. This is important. This information is not intended to replace advice given to you by your health care provider. Make sure you discuss any questions you have with your health care provider.   Community-Acquired Pneumonia, Adult Pneumonia is a type of lung infection that causes swelling in the airways of the lungs. Mucus and fluid may also build up inside the airways. This may cause coughing and difficulty breathing. There are different types  of pneumonia. One type can develop while a person is in a hospital. A different type is called community-acquired pneumonia. It develops in people who are not, and have not recently been, in the hospital or another type of health care facility. What are the causes? This condition may be caused by:  Viruses. This is the most common cause of pneumonia.  Bacteria. Community-acquired pneumonia is often caused by Streptococcus pneumoniae bacteria. These bacteria are often passed from one person to another by breathing in droplets from the cough or sneeze of an infected person.  Fungi. This is the least common cause of pneumonia. What increases the risk? The following factors may make you more likely to develop this condition:  Having a chronic disease, such as chronic obstructive pulmonary disease (COPD), asthma, congestive heart failure, cystic fibrosis, diabetes, or kidney disease.  Having  early-stage or late-stage HIV.  Having sickle cell disease.  Having had your spleen removed (splenectomy).  Having poor dental hygiene.  Having a medical condition that increases the risk of breathing in (aspirating) secretions from your own mouth and nose.  Having a weakened body defense system (immune system).  Being a smoker.  Traveling to areas where pneumonia-causing germs commonly exist.  Being around animal habitats or animals that have pneumonia-causing germs, including birds, bats, rabbits, cats, and farm animals. What are the signs or symptoms? Symptoms of this condition include:  A dry cough.  A wet (productive) cough.  Fever.  Sweating.  Chest pain, especially when breathing deeply or coughing.  Rapid breathing or difficulty breathing.  Shortness of breath.  Shaking chills.  Fatigue.  Muscle aches. How is this diagnosed? This condition may be diagnosed based on:  Your medical history.  A physical exam. You may also have tests, including:  Chest X-rays.  Tests of your blood oxygen level and other blood gases.  Tests on blood, mucus (sputum), fluid around your lungs (pleural fluid), and urine. If your pneumonia is severe, other tests may be done to find the exact cause of your illness. How is this treated? Treatment for this condition depends on many factors, such as the cause of your pneumonia, the medicines you take, and other medical conditions that you have. For most adults, treatment and recovery from pneumonia may occur at home. In some cases, treatment must happen in a hospital. Treatment may include:  Medicines that are given by mouth or through an IV, including: ? Antibiotic medicines, if the pneumonia was caused by bacteria. ? Antiviral medicines, if the pneumonia was caused by a virus.  Being given extra oxygen.  Respiratory therapy. Although rare, treating severe pneumonia may include:  Using a machine to help you breathe  (mechanical ventilation). This is done if you are not breathing well on your own and you cannot maintain a safe blood oxygen level.  Thoracentesis. This is a procedure to remove fluid from around one lung or both lungs to help you breathe better. Follow these instructions at home:  Medicines  Take over-the-counter and prescription medicines only as told by your health care provider. ? Only take cough medicine if you are losing sleep. Be aware that cough medicine can prevent your body's natural ability to remove mucus from your lungs.  If you were prescribed an antibiotic medicine, take it as told by your health care provider. Do not stop taking the antibiotic even if you start to feel better. General instructions  Sleep in a semi-upright position at night. Try sleeping in a reclining chair,  or place a few pillows under your head.  Rest as needed and get at least 8 hours of sleep each night.  Drink enough water to keep your urine pale yellow. This will help to thin out mucus secretions in your lungs.  Eat a healthy diet that includes plenty of vegetables, fruits, whole grains, low-fat dairy products, and lean protein.  Do not use any products that contain nicotine or tobacco, such as cigarettes, e-cigarettes, and chewing tobacco. If you need help quitting, ask your health care provider.  Keep all follow-up visits as told by your health care provider. This is important. How is this prevented? You can lower your risk of developing community-acquired pneumonia by:  Getting a pneumococcal vaccine. There are different types and schedules of pneumococcal vaccines. Ask your health care provider which option is best for you. Consider getting the vaccine if: ? You are older than 62 years of age. ? You are older than 62 years of age and are undergoing cancer treatment, have chronic lung disease, or have other medical conditions that affect your immune system. Ask your health care provider if this  applies to you.  Getting an influenza vaccine every year. Ask your health care provider which type of vaccine is best for you.  Getting regular checkups from your dentist.  Washing your hands often. If soap and water are not available, use hand sanitizer. Contact a health care provider if:  You have a fever.  You are losing sleep because you cannot control your cough with cough medicine. Get help right away if:  You have worsening shortness of breath.  You have increased chest pain.  Your sickness becomes worse, especially if you are an older adult or have a weakened immune system.  You cough up blood. Summary  Pneumonia is an infection of the lungs.  Community-acquired pneumonia develops in people who have not been in the hospital. It can be caused by bacteria, viruses, or fungi.  This condition may be treated with antibiotics or antiviral medicines.  Severe cases may require hospitalization, mechanical ventilation, and other procedures to drain fluid from the lungs. This information is not intended to replace advice given to you by your health care provider. Make sure you discuss any questions you have with your health care provider.

## 2019-11-15 NOTE — Progress Notes (Signed)
Internal Medicine Clinic Attending  I saw and evaluated the patient.  I personally confirmed the key portions of the history and exam documented by Dr. Shon Baton and I reviewed pertinent patient test results.  The assessment, diagnosis, and plan were formulated together and I agree with the documentation in the resident's note.  Antibiotic therapy is appropriate in this situation with worsening symptoms of this duration.  Chest xray recommended if she doesn't respond favorably to treatment.

## 2019-11-15 NOTE — Assessment & Plan Note (Signed)
Patient presents for in-person evaluation. Symptoms unchanged from yesterday. Vitals significant for oxygen saturation 94% on room air, patient is afebrile.  Physical exam significant for: - HENT: normocephalic atraumatic, mucous membranes moist, worsening of maxillary sinus pressure when leans head down - Pulmonary: normal work of breathing on room air, clear to auscultation throughout except for crackles over R lower lobe, no wheezing.  A/P: Patient has signs and symptoms of both upper and lower respiratory illness. Will treat for acute sinusitis and community-acquired pneumonia with 7-day course of doxycycline.  - doxycycline 100 mg twice daily for 7 days - Advised patient to try a short course (3 or fewer days) of Afrin - Continue OTC cough and congestion medicines - Return precautions given - Patient states she has already been in touch with her rheumatologist who has paused her RA treatments while she is recovering from these illnesses - Return to clinic in 1 week for follow-up

## 2019-11-15 NOTE — Assessment & Plan Note (Signed)
See community-acquired pneumonia in the problem list for history, assessment, and plan.

## 2019-11-22 ENCOUNTER — Encounter: Payer: BC Managed Care – PPO | Admitting: Internal Medicine

## 2019-11-22 DIAGNOSIS — M5416 Radiculopathy, lumbar region: Secondary | ICD-10-CM | POA: Diagnosis not present

## 2019-11-22 DIAGNOSIS — M2550 Pain in unspecified joint: Secondary | ICD-10-CM | POA: Diagnosis not present

## 2019-11-22 DIAGNOSIS — R748 Abnormal levels of other serum enzymes: Secondary | ICD-10-CM | POA: Diagnosis not present

## 2019-11-22 DIAGNOSIS — M0579 Rheumatoid arthritis with rheumatoid factor of multiple sites without organ or systems involvement: Secondary | ICD-10-CM | POA: Diagnosis not present

## 2019-11-22 DIAGNOSIS — M7989 Other specified soft tissue disorders: Secondary | ICD-10-CM | POA: Diagnosis not present

## 2019-11-23 ENCOUNTER — Other Ambulatory Visit: Payer: Self-pay

## 2019-11-23 ENCOUNTER — Ambulatory Visit: Payer: BC Managed Care – PPO | Admitting: Internal Medicine

## 2019-11-23 ENCOUNTER — Encounter: Payer: Self-pay | Admitting: Internal Medicine

## 2019-11-23 ENCOUNTER — Ambulatory Visit (HOSPITAL_COMMUNITY)
Admission: RE | Admit: 2019-11-23 | Discharge: 2019-11-23 | Disposition: A | Discharge status: Home / Self Care | Payer: BC Managed Care – PPO | Source: Ambulatory Visit | Attending: Internal Medicine | Admitting: Internal Medicine

## 2019-11-23 VITALS — BP 117/79 | HR 87 | Temp 98.2°F | Ht 65.0 in | Wt 173.6 lb

## 2019-11-23 DIAGNOSIS — D849 Immunodeficiency, unspecified: Secondary | ICD-10-CM

## 2019-11-23 DIAGNOSIS — R059 Cough, unspecified: Secondary | ICD-10-CM | POA: Diagnosis not present

## 2019-11-23 DIAGNOSIS — J189 Pneumonia, unspecified organism: Secondary | ICD-10-CM

## 2019-11-23 MED ORDER — LEVOFLOXACIN 750 MG PO TABS: 750.0000 mg | ORAL_TABLET | Freq: Every day | ORAL | 0 refills | Status: AC

## 2019-11-23 NOTE — Patient Instructions (Signed)
Heidi Chapman, I'm so sorry you're still feeling sick from this pneumonia.   A couple of things today:  -I'm checking a chest x-ray -I'm doing a test on your urine to see if you have a more rare form of pneumonia  -I'm changing your antibiotics to extend out another 7 day course.   I will call when I have these results. Let's bring you back in another 7-10 days to see how you're feeling.   I'll be sure to get your disability paperwork filled out as soon as I can.   Feel better! Dr. Koleen Distance

## 2019-11-24 ENCOUNTER — Telehealth: Payer: Self-pay | Admitting: Internal Medicine

## 2019-11-24 ENCOUNTER — Encounter: Payer: Self-pay | Admitting: Internal Medicine

## 2019-11-24 ENCOUNTER — Telehealth: Payer: Self-pay

## 2019-11-24 LAB — LEGIONELLA PNEUMOPHILA SEROGP 1 UR AG: L. pneumophila Serogp 1 Ur Ag: NEGATIVE

## 2019-11-24 NOTE — Telephone Encounter (Signed)
Attempted to reach Ms. Piccininni via telephone x2 regarding chest x-ray results and recommendation to get repeat COVID testing. Will continue to try and contact.

## 2019-11-24 NOTE — Telephone Encounter (Signed)
Relayed info below to patient. States she already has her granddaughter scheduled this weekend for covid testing at [facility] and will have hers done at same time. Advised to quarantine until the results are known. States she will. Hubbard Hartshorn, BSN, RN-BC

## 2019-11-24 NOTE — Telephone Encounter (Signed)
Spoke with pt today, informed the FMLA form has been completed and faxed. Will mailed the original form to patient.

## 2019-11-24 NOTE — Assessment & Plan Note (Addendum)
Patient presents for follow-up after completing 7 day course of Doxycyline to treat possible bacterial sinusitis and CAP. She notes her upper respiratory symptoms have improved and overall feels somewhat improved, but is still having quite a bit of fatigue, dyspnea on exertion, and productive cough with associated heaviness in her chest.   With her history of immunosuppression and persistent symptoms, will proceed as failed antibiotic therapy with Doxy.  -obtain chest x-ray -will test urine strep and legionella -treat with Levaquin for another week unless legionella returns positive   ADDENDUM: radiologist spoke with on-call provider that findings should be correlated with COVID-19 status. Although she tested negative on 10/30/19, will have her get re-tested given her immunosuppressed status.

## 2019-11-24 NOTE — Progress Notes (Signed)
Acute Office Visit  Subjective:    Patient ID: Heidi Chapman, female    DOB: 1955/11/22, 64 y.o.   MRN: [id number]  Chief Complaint  Patient presents with  . Follow-up    HPI Patient is in today for follow-up on CAP. Please see problem based charting for further details.   Past Medical History:  Diagnosis Date  . Arthritis   . GERD (gastroesophageal reflux disease)     Past Surgical History:  Procedure Laterality Date  . CESAREAN SECTION    . CHOLECYSTECTOMY N/A 10/24/2017   Procedure: LAPAROSCOPIC CHOLECYSTECTOMY WITH INTRAOPERATIVE CHOLANGIOGRAM;  Surgeon: Erroll Luna, MD;  Location: [place];  Service: General;  Laterality: N/A;    History reviewed. No pertinent family history.  Social History   Socioeconomic History  . Marital status: Legally Separated    Spouse name: Not on file  . Number of children: Not on file  . Years of education: Not on file  . Highest education level: Not on file  Occupational History  . Not on file  Tobacco Use  . Smoking status: Current Every Day Smoker    Packs/day: 0.50  . Smokeless tobacco: Never Used  . Tobacco comment: Stopped 2 weeks ago   Substance and Sexual Activity  . Alcohol use: Never  . Drug use: Never  . Sexual activity: Not on file  Other Topics Concern  . Not on file  Social History Narrative  . Not on file   Social Determinants of Health   Financial Resource Strain:   . Difficulty of Paying Living Expenses: Not on file  Food Insecurity:   . Worried About Charity fundraiser in the Last Year: Not on file  . Ran Out of Food in the Last Year: Not on file  Transportation Needs:   . Lack of Transportation (Medical): Not on file  . Lack of Transportation (Non-Medical): Not on file  Physical Activity:   . Days of Exercise per Week: Not on file  . Minutes of Exercise per Session: Not on file  Stress:   . Feeling of Stress : Not on file  Social Connections:   . Frequency of Communication with Friends and  Family: Not on file  . Frequency of Social Gatherings with Friends and Family: Not on file  . Attends Religious Services: Not on file  . Active Member of Clubs or Organizations: Not on file  . Attends Archivist Meetings: Not on file  . Marital Status: Not on file  Intimate Partner Violence:   . Fear of Current or Ex-Partner: Not on file  . Emotionally Abused: Not on file  . Physically Abused: Not on file  . Sexually Abused: Not on file    Outpatient Medications Prior to Visit  Medication Sig Dispense Refill  . acetaminophen (TYLENOL) 325 MG tablet Take 2 tablets (650 mg total) by mouth every 6 (six) hours as needed for mild pain (or Fever >/= 101).    . bismuth subsalicylate (PEPTO BISMOL) 262 MG chewable tablet Chew 524 mg by mouth every evening.    . cimetidine (TAGAMET) 200 MG tablet Take 200 mg by mouth 2 (two) times daily.    . codeine 30 MG tablet Take 1 tablet (30 mg total) by mouth every 6 (six) hours as needed for moderate pain or severe pain (pain not relieved by oral Tylenol). 15 tablet 0  . cyclobenzaprine (FLEXERIL) 10 MG tablet Take 1 tablet (10 mg total) by mouth 3 (three) times daily as  needed for up to 30 doses for muscle spasms. 30 tablet 0  . fluconazole (DIFLUCAN) 200 MG tablet Take 1 tablet once if you develop symptoms of a yeast infection. 1 tablet 0  . fluticasone (FLONASE) 50 MCG/ACT nasal spray Place 2 sprays into both nostrils daily. 16 g 0  . guaiFENesin (MUCINEX) 600 MG 12 hr tablet Take 1 tablet (600 mg total) by mouth 2 (two) times daily. 60 tablet 0  . methylPREDNISolone (MEDROL DOSEPAK) 4 MG TBPK tablet Follow instructions on package. 1 each 0  . saccharomyces boulardii (FLORASTOR) 250 MG capsule You can buy this at any drug store, and whichever brand you choose.  I would take for the next 2 weeks.  Follow the package instructions for dose.     No facility-administered medications prior to visit.    No Known Allergies  Review of Systems   Constitutional: Positive for chills and fatigue. Negative for fever.  HENT: Negative for sinus pain and sore throat.   Respiratory: Positive for cough, chest tightness and shortness of breath.   Neurological: Positive for light-headedness. Negative for syncope.       Objective:    Physical Exam Constitutional:      Appearance: Normal appearance. She is ill-appearing.  HENT:     Nose: Nose normal. No mucosal edema.     Right Sinus: No maxillary sinus tenderness.     Left Sinus: No maxillary sinus tenderness.  Cardiovascular:     Rate and Rhythm: Normal rate and regular rhythm.  Pulmonary:     Effort: Pulmonary effort is normal.     Comments: Crackles at left base  Skin:    General: Skin is warm and dry.  Neurological:     Mental Status: She is alert.     BP 117/79 (BP Location: Left Arm, Patient Position: Sitting, Cuff Size: Normal)   Pulse 87   Temp 98.2 F (36.8 C) (Oral)   Ht 5\' 5"  (1.651 m)   Wt 173 lb 9.6 oz (78.7 kg)   SpO2 97% Comment: Room air  BMI 28.89 kg/m  Wt Readings from Last 3 Encounters:  11/23/19 173 lb 9.6 oz (78.7 kg)  11/15/19 175 lb 6.4 oz (79.6 kg)  03/13/19 181 lb 11.2 oz (82.4 kg)    Health Maintenance Due  Topic Date Due  . Hepatitis C Screening  Never done  . TETANUS/TDAP  Never done  . MAMMOGRAM  Never done  . COLONOSCOPY  Never done  . INFLUENZA VACCINE  09/17/2019    There are no preventive care reminders to display for this patient.   No results found for: TSH Lab Results  Component Value Date   WBC 7.7 10/25/2017   HGB 12.1 10/25/2017   HCT 37.0 10/25/2017   MCV 88.9 10/25/2017   PLT 169 10/25/2017   Lab Results  Component Value Date   NA 140 01/26/2019   K 4.4 01/26/2019   CO2 21 01/26/2019   GLUCOSE 85 01/26/2019   BUN 10 01/26/2019   CREATININE 0.86 01/26/2019   BILITOT 0.8 01/26/2019   ALKPHOS 208 (H) 01/26/2019   AST 29 01/26/2019   ALT 33 (H) 01/26/2019   PROT 7.2 01/26/2019   ALBUMIN 4.3 01/26/2019    CALCIUM 9.7 01/26/2019   ANIONGAP 7 10/25/2017   Lab Results  Component Value Date   CHOL 206 (H) 12/02/2017   Lab Results  Component Value Date   HDL 55 12/02/2017   Lab Results  Component Value Date  [place] 114 (H) 12/02/2017   Lab Results  Component Value Date   TRIG 186 (H) 12/02/2017   Lab Results  Component Value Date   CHOLHDL 3.7 12/02/2017   No results found for: HGBA1C     Assessment & Plan:   Problem List Items Addressed This Visit      Respiratory   CAP (community acquired pneumonia) - Primary    Patient presents for follow-up after completing 7 day course of Doxycyline to treat possible bacterial sinusitis and CAP. She notes her upper respiratory symptoms have improved and overall feels somewhat improved, but is still having quite a bit of fatigue, dyspnea on exertion, and productive cough with associated heaviness in her chest.   With her history of immunosuppression and persistent symptoms, will proceed as failed antibiotic therapy with Doxy.  -obtain chest x-ray -will test urine strep and legionella -treat with Levaquin for another week unless legionella returns positive   ADDENDUM: radiologist spoke with on-call provider that findings should be correlated with COVID-19 status. Although she tested negative on 10/30/19, will have her get re-tested given her immunosuppressed status.       Relevant Medications   levofloxacin (LEVAQUIN) 750 MG tablet   Other Relevant Orders   DG Chest 2 View (Completed)   L. Pneumophila Serogp 1 Ur Ag   Streptococcus Pneumoniae Ag       Meds ordered this encounter  Medications  . levofloxacin (LEVAQUIN) 750 MG tablet    Sig: Take 1 tablet (750 mg total) by mouth daily for 7 days.    Dispense:  7 tablet    Refill:  0     Kemia Wendel D Illana Nolting, DO

## 2019-11-25 LAB — STREPTOCOCCUS PNEUMONIAE AG (CSF): Streptococcus Pneumoniae Ag: NEGATIVE

## 2019-11-28 NOTE — Progress Notes (Signed)
[facility] Attending  Case discussed with Dr. Bloomfield  At the time of the visit.  We reviewed the resident's history and exam and pertinent patient test results.  I agree with the assessment, diagnosis, and plan of care documented in the resident's note.  

## 2019-11-30 ENCOUNTER — Encounter: Payer: Self-pay | Admitting: Internal Medicine

## 2019-11-30 ENCOUNTER — Ambulatory Visit: Payer: BC Managed Care – PPO | Admitting: Internal Medicine

## 2019-11-30 ENCOUNTER — Other Ambulatory Visit: Payer: Self-pay

## 2019-11-30 DIAGNOSIS — J189 Pneumonia, unspecified organism: Secondary | ICD-10-CM | POA: Diagnosis not present

## 2019-11-30 NOTE — Patient Instructions (Signed)
Heidi Chapman, Good seeing you again. Glad we are making baby steps of progress.   You have been on 2 good courses of antibiotics, so I do not think we need any additional courses.   Regarding your sinuses, try taking flonase twice daily and a daily allergy pill.  If you have one at home, using a neti pot is another great tool to clear out the congestion.   I'll see you back in 2 weeks to see how you are doing.   Take care! Dr. Koleen Distance

## 2019-12-01 ENCOUNTER — Encounter: Payer: Self-pay | Admitting: Internal Medicine

## 2019-12-01 NOTE — Assessment & Plan Note (Signed)
Patient has now completed 2 courses of antibiotics with Doxy and Levaquin. She has noticed some improvement in her cough and shortness of breath. Has not had any fevers or chills for several days. Can tell she is making slow progress.  She notes her sinuses are starting to become more congested again and endorses tenderness to palpation. No ear pain or sore throat.  Do not see any further indication for antibiotics. Will treat congestion with twice daily flonase and daily antihistamine.  Return in 2 weeks to ensure she is continuing to improve.

## 2019-12-01 NOTE — Progress Notes (Signed)
Acute Office Visit  Subjective:    Patient ID: Heidi Chapman, female    DOB: 10-10-1955, 64 y.o.   MRN: [id number]  Chief Complaint  Patient presents with  . Follow-up    HPI Patient is in today for follow-up on CAP. Please see problem based charting for further details.   Past Medical History:  Diagnosis Date  . Arthritis   . GERD (gastroesophageal reflux disease)     Past Surgical History:  Procedure Laterality Date  . CESAREAN SECTION    . CHOLECYSTECTOMY N/A 10/24/2017   Procedure: LAPAROSCOPIC CHOLECYSTECTOMY WITH INTRAOPERATIVE CHOLANGIOGRAM;  Surgeon: Erroll Luna, MD;  Location: [place];  Service: General;  Laterality: N/A;    History reviewed. No pertinent family history.  Social History   Socioeconomic History  . Marital status: Legally Separated    Spouse name: Not on file  . Number of children: Not on file  . Years of education: Not on file  . Highest education level: Not on file  Occupational History  . Not on file  Tobacco Use  . Smoking status: Current Every Day Smoker    Packs/day: 0.50  . Smokeless tobacco: Never Used  . Tobacco comment: Stopped 2 weeks ago   Substance and Sexual Activity  . Alcohol use: Never  . Drug use: Never  . Sexual activity: Not on file  Other Topics Concern  . Not on file  Social History Narrative  . Not on file   Social Determinants of Health   Financial Resource Strain:   . Difficulty of Paying Living Expenses: Not on file  Food Insecurity:   . Worried About Charity fundraiser in the Last Year: Not on file  . Ran Out of Food in the Last Year: Not on file  Transportation Needs:   . Lack of Transportation (Medical): Not on file  . Lack of Transportation (Non-Medical): Not on file  Physical Activity:   . Days of Exercise per Week: Not on file  . Minutes of Exercise per Session: Not on file  Stress:   . Feeling of Stress : Not on file  Social Connections:   . Frequency of Communication with Friends and  Family: Not on file  . Frequency of Social Gatherings with Friends and Family: Not on file  . Attends Religious Services: Not on file  . Active Member of Clubs or Organizations: Not on file  . Attends Archivist Meetings: Not on file  . Marital Status: Not on file  Intimate Partner Violence:   . Fear of Current or Ex-Partner: Not on file  . Emotionally Abused: Not on file  . Physically Abused: Not on file  . Sexually Abused: Not on file    Outpatient Medications Prior to Visit  Medication Sig Dispense Refill  . acetaminophen (TYLENOL) 325 MG tablet Take 2 tablets (650 mg total) by mouth every 6 (six) hours as needed for mild pain (or Fever >/= 101).    . bismuth subsalicylate (PEPTO BISMOL) 262 MG chewable tablet Chew 524 mg by mouth every evening.    . cimetidine (TAGAMET) 200 MG tablet Take 200 mg by mouth 2 (two) times daily.    . codeine 30 MG tablet Take 1 tablet (30 mg total) by mouth every 6 (six) hours as needed for moderate pain or severe pain (pain not relieved by oral Tylenol). 15 tablet 0  . cyclobenzaprine (FLEXERIL) 10 MG tablet Take 1 tablet (10 mg total) by mouth 3 (three) times daily as  needed for up to 30 doses for muscle spasms. 30 tablet 0  . fluconazole (DIFLUCAN) 200 MG tablet Take 1 tablet once if you develop symptoms of a yeast infection. 1 tablet 0  . fluticasone (FLONASE) 50 MCG/ACT nasal spray Place 2 sprays into both nostrils daily. 16 g 0  . guaiFENesin (MUCINEX) 600 MG 12 hr tablet Take 1 tablet (600 mg total) by mouth 2 (two) times daily. 60 tablet 0  . levofloxacin (LEVAQUIN) 750 MG tablet Take 1 tablet (750 mg total) by mouth daily for 7 days. 7 tablet 0  . methylPREDNISolone (MEDROL DOSEPAK) 4 MG TBPK tablet Follow instructions on package. 1 each 0  . saccharomyces boulardii (FLORASTOR) 250 MG capsule You can buy this at any drug store, and whichever brand you choose.  I would take for the next 2 weeks.  Follow the package instructions for dose.      No facility-administered medications prior to visit.    No Known Allergies  Review of Systems  Constitutional: Positive for fatigue. Negative for chills and fever.  HENT: Positive for congestion and sinus pressure. Negative for ear pain and sore throat.   Respiratory: Positive for cough and shortness of breath. Negative for wheezing.   Cardiovascular: Negative for chest pain.  Neurological: Positive for dizziness.       Objective:    Physical Exam Constitutional:      General: She is not in acute distress.    Appearance: Normal appearance.  HENT:     Nose: Mucosal edema present.     Right Turbinates: Swollen.     Left Turbinates: Swollen.     Right Sinus: Maxillary sinus tenderness present.     Left Sinus: Maxillary sinus tenderness present.  Cardiovascular:     Rate and Rhythm: Normal rate and regular rhythm.  Pulmonary:     Effort: Pulmonary effort is normal.     Breath sounds: Normal breath sounds.  Neurological:     Mental Status: She is alert.     BP 112/74 (BP Location: Left Arm, Patient Position: Sitting, Cuff Size: Normal)   Pulse 98   Temp 98.1 F (36.7 C) (Oral)   Ht 5\' 5"  (1.651 m)   Wt 173 lb 4.8 oz (78.6 kg)   SpO2 97% Comment: room air  BMI 28.84 kg/m  Wt Readings from Last 3 Encounters:  11/30/19 173 lb 4.8 oz (78.6 kg)  11/23/19 173 lb 9.6 oz (78.7 kg)  11/15/19 175 lb 6.4 oz (79.6 kg)    Health Maintenance Due  Topic Date Due  . Hepatitis C Screening  Never done  . TETANUS/TDAP  Never done  . MAMMOGRAM  Never done  . COLONOSCOPY  Never done  . INFLUENZA VACCINE  09/17/2019    There are no preventive care reminders to display for this patient.   No results found for: TSH Lab Results  Component Value Date   WBC 7.7 10/25/2017   HGB 12.1 10/25/2017   HCT 37.0 10/25/2017   MCV 88.9 10/25/2017   PLT 169 10/25/2017   Lab Results  Component Value Date   NA 140 01/26/2019   K 4.4 01/26/2019   CO2 21 01/26/2019   GLUCOSE 85  01/26/2019   BUN 10 01/26/2019   CREATININE 0.86 01/26/2019   BILITOT 0.8 01/26/2019   ALKPHOS 208 (H) 01/26/2019   AST 29 01/26/2019   ALT 33 (H) 01/26/2019   PROT 7.2 01/26/2019   ALBUMIN 4.3 01/26/2019   CALCIUM 9.7 01/26/2019  ANIONGAP 7 10/25/2017   Lab Results  Component Value Date   CHOL 206 (H) 12/02/2017   Lab Results  Component Value Date   HDL 55 12/02/2017   Lab Results  Component Value Date   LDLCALC 114 (H) 12/02/2017   Lab Results  Component Value Date   TRIG 186 (H) 12/02/2017   Lab Results  Component Value Date   CHOLHDL 3.7 12/02/2017   No results found for: HGBA1C     Assessment & Plan:   Problem List Items Addressed This Visit      Respiratory   CAP (community acquired pneumonia)    Patient has now completed 2 courses of antibiotics with Doxy and Levaquin. She has noticed some improvement in her cough and shortness of breath. Has not had any fevers or chills for several days. Can tell she is making slow progress.  She notes her sinuses are starting to become more congested again and endorses tenderness to palpation. No ear pain or sore throat.  Do not see any further indication for antibiotics. Will treat congestion with twice daily flonase and daily antihistamine.  Return in 2 weeks to ensure she is continuing to improve.           No orders of the defined types were placed in this encounter.    Delice Bison, DO

## 2019-12-07 NOTE — Progress Notes (Signed)
[facility] Attending  Case discussed with Dr. Bloomfield  At the time of the visit.  We reviewed the resident's history and exam and pertinent patient test results.  I agree with the assessment, diagnosis, and plan of care documented in the resident's note.  

## 2019-12-14 ENCOUNTER — Ambulatory Visit: Payer: BC Managed Care – PPO | Admitting: Internal Medicine

## 2019-12-14 ENCOUNTER — Encounter: Payer: Self-pay | Admitting: Internal Medicine

## 2019-12-14 ENCOUNTER — Other Ambulatory Visit: Payer: Self-pay

## 2019-12-14 DIAGNOSIS — J0100 Acute maxillary sinusitis, unspecified: Secondary | ICD-10-CM

## 2019-12-14 DIAGNOSIS — J189 Pneumonia, unspecified organism: Secondary | ICD-10-CM

## 2019-12-14 NOTE — Assessment & Plan Note (Signed)
Sinus pain and congestion have resolved since completing antibiotics, followed by initiation of intranasal glucocorticoid and daily antihistamine.  Advised her to continue Flonase and antihistamine.

## 2019-12-14 NOTE — Patient Instructions (Signed)
Heidi Chapman seeing you again and SO glad the pneumonia and sinuses are better.   I think your right sided rib/chest wall pain is from coughing for such a long period of time. I would definitely keep using the heating pad like you're doing. You can also try the topical anti-inflammatory, Voltaren (Diclofenac) to avoid GI upset from oral medications.   This should get better over the next couple of weeks, but give [place] a call if it continues to bother you.   Take care, and have a great holiday season!  Dr. Koleen Distance

## 2019-12-14 NOTE — Assessment & Plan Note (Signed)
Patient presents for follow-up on CAP. Initially failed treatment with Doxycycline. Transitioned to Levaquin to complete another 7 day course. Still has a mild productive cough, but overall doing much better. Fatigue and shortness of breath have resolved. She has been having right sided chest wall discomfort which is most likely a musculoskeletal etiology from prolonged coughing. Pain improves with laying down on a heating pad. Will continue supportive care with heating pad and topical anti-inflammatory.

## 2019-12-14 NOTE — Progress Notes (Signed)
Acute Office Visit  Subjective:    Patient ID: Heidi Chapman, female    DOB: March 08, 1955, 64 y.o.   MRN: [id number]  CC: follow-up on CAP, sinus congestion   HPI Patient is in today for follow-up on CAP and sinus congestion to ensure clinical improvement after initially failing antibiotic therapy. Please see problem based charting for further details.   Past Medical History:  Diagnosis Date  . Arthritis   . GERD (gastroesophageal reflux disease)     Past Surgical History:  Procedure Laterality Date  . CESAREAN SECTION    . CHOLECYSTECTOMY N/A 10/24/2017   Procedure: LAPAROSCOPIC CHOLECYSTECTOMY WITH INTRAOPERATIVE CHOLANGIOGRAM;  Surgeon: Erroll Luna, MD;  Location: [facility];  Service: General;  Laterality: N/A;    History reviewed. No pertinent family history.  Social History   Socioeconomic History  . Marital status: Legally Separated    Spouse name: Not on file  . Number of children: Not on file  . Years of education: Not on file  . Highest education level: Not on file  Occupational History  . Not on file  Tobacco Use  . Smoking status: Current Every Day Smoker    Packs/day: 0.10  . Smokeless tobacco: Never Used  . Tobacco comment: 2 per day   Substance and Sexual Activity  . Alcohol use: Never  . Drug use: Never  . Sexual activity: Not on file  Other Topics Concern  . Not on file  Social History Narrative  . Not on file   Social Determinants of Health   Financial Resource Strain:   . Difficulty of Paying Living Expenses: Not on file  Food Insecurity:   . Worried About Charity fundraiser in the Last Year: Not on file  . Ran Out of Food in the Last Year: Not on file  Transportation Needs:   . Lack of Transportation (Medical): Not on file  . Lack of Transportation (Non-Medical): Not on file  Physical Activity:   . Days of Exercise per Week: Not on file  . Minutes of Exercise per Session: Not on file  Stress:   . Feeling of Stress : Not on file   Social Connections:   . Frequency of Communication with Friends and Family: Not on file  . Frequency of Social Gatherings with Friends and Family: Not on file  . Attends Religious Services: Not on file  . Active Member of Clubs or Organizations: Not on file  . Attends Archivist Meetings: Not on file  . Marital Status: Not on file  Intimate Partner Violence:   . Fear of Current or Ex-Partner: Not on file  . Emotionally Abused: Not on file  . Physically Abused: Not on file  . Sexually Abused: Not on file    Outpatient Medications Prior to Visit  Medication Sig Dispense Refill  . acetaminophen (TYLENOL) 325 MG tablet Take 2 tablets (650 mg total) by mouth every 6 (six) hours as needed for mild pain (or Fever >/= 101).    . bismuth subsalicylate (PEPTO BISMOL) 262 MG chewable tablet Chew 524 mg by mouth every evening.    . cimetidine (TAGAMET) 200 MG tablet Take 200 mg by mouth 2 (two) times daily.    . codeine 30 MG tablet Take 1 tablet (30 mg total) by mouth every 6 (six) hours as needed for moderate pain or severe pain (pain not relieved by oral Tylenol). 15 tablet 0  . cyclobenzaprine (FLEXERIL) 10 MG tablet Take 1 tablet (10 mg total)  by mouth 3 (three) times daily as needed for up to 30 doses for muscle spasms. 30 tablet 0  . fluconazole (DIFLUCAN) 200 MG tablet Take 1 tablet once if you develop symptoms of a yeast infection. 1 tablet 0  . fluticasone (FLONASE) 50 MCG/ACT nasal spray Place 2 sprays into both nostrils daily. 16 g 0  . guaiFENesin (MUCINEX) 600 MG 12 hr tablet Take 1 tablet (600 mg total) by mouth 2 (two) times daily. 60 tablet 0  . methylPREDNISolone (MEDROL DOSEPAK) 4 MG TBPK tablet Follow instructions on package. 1 each 0  . saccharomyces boulardii (FLORASTOR) 250 MG capsule You can buy this at any drug store, and whichever brand you choose.  I would take for the next 2 weeks.  Follow the package instructions for dose.     No facility-administered  medications prior to visit.    No Known Allergies  Review of Systems  Constitutional: Negative for chills and fever.  HENT: Negative for ear pain and sinus pain.   Respiratory: Positive for cough. Negative for shortness of breath.   Cardiovascular: Negative for chest pain.  Neurological: Negative for light-headedness and headaches.       Objective:    Physical Exam Constitutional:      General: She is not in acute distress.    Appearance: Normal appearance.  Cardiovascular:     Rate and Rhythm: Normal rate and regular rhythm.  Pulmonary:     Effort: Pulmonary effort is normal.     Breath sounds: Normal breath sounds.  Chest:     Comments: Chest wall tenderness beneath breast and wrapping around to back. No overlying rash or skin changes.  Neurological:     Mental Status: She is alert.     BP 128/90 (BP Location: Left Arm, Patient Position: Sitting, Cuff Size: Normal)   Pulse 85   Temp 98.6 F (37 C) (Oral)   Ht 5\' 5"  (1.651 m)   Wt 175 lb (79.4 kg)   SpO2 97% Comment: room air  BMI 29.12 kg/m  Wt Readings from Last 3 Encounters:  12/14/19 175 lb (79.4 kg)  11/30/19 173 lb 4.8 oz (78.6 kg)  11/23/19 173 lb 9.6 oz (78.7 kg)    Health Maintenance Due  Topic Date Due  . Hepatitis C Screening  Never done  . TETANUS/TDAP  Never done  . MAMMOGRAM  Never done  . COLONOSCOPY  Never done  . INFLUENZA VACCINE  09/17/2019    There are no preventive care reminders to display for this patient.   No results found for: TSH Lab Results  Component Value Date   WBC 7.7 10/25/2017   HGB 12.1 10/25/2017   HCT 37.0 10/25/2017   MCV 88.9 10/25/2017   PLT 169 10/25/2017   Lab Results  Component Value Date   NA 140 01/26/2019   K 4.4 01/26/2019   CO2 21 01/26/2019   GLUCOSE 85 01/26/2019   BUN 10 01/26/2019   CREATININE 0.86 01/26/2019   BILITOT 0.8 01/26/2019   ALKPHOS 208 (H) 01/26/2019   AST 29 01/26/2019   ALT 33 (H) 01/26/2019   PROT 7.2 01/26/2019    ALBUMIN 4.3 01/26/2019   CALCIUM 9.7 01/26/2019   ANIONGAP 7 10/25/2017   Lab Results  Component Value Date   CHOL 206 (H) 12/02/2017   Lab Results  Component Value Date   HDL 55 12/02/2017   Lab Results  Component Value Date   LDLCALC 114 (H) 12/02/2017   Lab Results  Component Value Date   TRIG 186 (H) 12/02/2017   Lab Results  Component Value Date   CHOLHDL 3.7 12/02/2017   No results found for: HGBA1C     Assessment & Plan:   Problem List Items Addressed This Visit      Respiratory   CAP (community acquired pneumonia)    Patient presents for follow-up on CAP. Initially failed treatment with Doxycycline. Transitioned to Levaquin to complete another 7 day course. Still has a mild productive cough, but overall doing much better. Fatigue and shortness of breath have resolved. She has been having right sided chest wall discomfort which is most likely a musculoskeletal etiology from prolonged coughing. Pain improves with laying down on a heating pad. Will continue supportive care with heating pad and topical anti-inflammatory.       Sinusitis, acute, maxillary    Sinus pain and congestion have resolved since completing antibiotics, followed by initiation of intranasal glucocorticoid and daily antihistamine.  Advised her to continue Flonase and antihistamine.           No orders of the defined types were placed in this encounter.    Delice Bison, DO

## 2019-12-15 NOTE — Progress Notes (Signed)
[facility] Attending  Case discussed with Dr. Bloomfield  At the time of the visit.  We reviewed the resident's history and exam and pertinent patient test results.  I agree with the assessment, diagnosis, and plan of care documented in the resident's note.  

## 2019-12-20 ENCOUNTER — Other Ambulatory Visit: Payer: Self-pay | Admitting: Internal Medicine

## 2019-12-26 DIAGNOSIS — M5416 Radiculopathy, lumbar region: Secondary | ICD-10-CM | POA: Diagnosis not present

## 2019-12-26 DIAGNOSIS — R748 Abnormal levels of other serum enzymes: Secondary | ICD-10-CM | POA: Diagnosis not present

## 2019-12-26 DIAGNOSIS — R5383 Other fatigue: Secondary | ICD-10-CM | POA: Diagnosis not present

## 2019-12-26 DIAGNOSIS — M2550 Pain in unspecified joint: Secondary | ICD-10-CM | POA: Diagnosis not present

## 2019-12-26 DIAGNOSIS — M0579 Rheumatoid arthritis with rheumatoid factor of multiple sites without organ or systems involvement: Secondary | ICD-10-CM | POA: Diagnosis not present

## 2020-05-13 ENCOUNTER — Ambulatory Visit (INDEPENDENT_AMBULATORY_CARE_PROVIDER_SITE_OTHER): Payer: 59

## 2020-05-13 ENCOUNTER — Other Ambulatory Visit: Payer: Self-pay

## 2020-05-13 ENCOUNTER — Ambulatory Visit (INDEPENDENT_AMBULATORY_CARE_PROVIDER_SITE_OTHER): Payer: 59 | Admitting: Podiatry

## 2020-05-13 DIAGNOSIS — M0579 Rheumatoid arthritis with rheumatoid factor of multiple sites without organ or systems involvement: Secondary | ICD-10-CM

## 2020-05-13 DIAGNOSIS — M2012 Hallux valgus (acquired), left foot: Secondary | ICD-10-CM

## 2020-05-13 DIAGNOSIS — M2011 Hallux valgus (acquired), right foot: Secondary | ICD-10-CM | POA: Diagnosis not present

## 2020-05-13 DIAGNOSIS — G5761 Lesion of plantar nerve, right lower limb: Secondary | ICD-10-CM | POA: Diagnosis not present

## 2020-05-13 DIAGNOSIS — M79672 Pain in left foot: Secondary | ICD-10-CM

## 2020-05-13 DIAGNOSIS — M79671 Pain in right foot: Secondary | ICD-10-CM

## 2020-05-13 NOTE — Progress Notes (Signed)
   Subjective: 65 y.o. female presents today as a new patient for evaluation of bilateral foot pain.  Patient states that she was recently diagnosed with rheumatoid arthritis approximately 1.5 years ago.  She is currently being managed by [facility] rheumatology with methotrexate and weekly infusion therapy.  She presents today to have her feet evaluated and discuss conservative treatment options to help alleviate some of her pain and symptoms.   Past Medical History:  Diagnosis Date  . Arthritis   . GERD (gastroesophageal reflux disease)       Objective: Physical Exam General: The patient is alert and oriented x3 in no acute distress.  Dermatology: Skin is cool, dry and supple bilateral lower extremities. Negative for open lesions or macerations.  Vascular: Palpable pedal pulses bilaterally. No edema or erythema noted. Capillary refill within normal limits.  Neurological: Epicritic and protective threshold grossly intact bilaterally.  Paresthesia with positive Tinel's sign with compression of the interdigital neuroma tween the second third digit of the right foot  Musculoskeletal Exam: Clinical evidence of bunion deformity noted to the respective foot. There is moderate pain on palpation range of motion of the first MPJ. Lateral deviation of the hallux noted consistent with hallux abductovalgus. Toe splaying noted to the second and third digits of the right foot  Radiographic Exam: Increased intermetatarsal angle greater than 15 with a hallux abductus angle greater than 30 noted on AP view. Moderate degenerative changes noted within the first MPJ. Overall the joint spaces are preserved and there is no significant periarticular spurring.  There does appear to be some diffuse osseous demineralization  Assessment: 1. HAV w/ bunion deformity bilateral 2.  Morton's neuroma right second interspace 3.  Rheumatoid arthritis   Plan of Care:  1. Patient was evaluated. X-Rays reviewed. 2.   Continue management for RA with [facility]umatology 3.  Recommend OTC arch supports to support the midfoot and also alleviate pressure from the Morton's neuroma.  Recommend shoes and OTC arch supports from [employer] running store 4.  I do believe that good supportive shoes and arch supports should help alleviate a lot of the patient's symptoms.  Continue conservative treatment at this time 5.  Return to clinic as needed      Edrick Kins, DPM [facility]  Dr. Edrick Kins, [address]                                        [place], [place] [address]                Office [phone]  Fax [phone]

## 2020-06-28 ENCOUNTER — Encounter: Payer: Self-pay | Admitting: Internal Medicine

## 2020-06-28 ENCOUNTER — Other Ambulatory Visit: Payer: Self-pay

## 2020-06-28 ENCOUNTER — Ambulatory Visit (INDEPENDENT_AMBULATORY_CARE_PROVIDER_SITE_OTHER): Payer: 59 | Admitting: Internal Medicine

## 2020-06-28 VITALS — BP 124/77 | HR 87 | Temp 98.6°F | Ht 65.0 in | Wt 179.0 lb

## 2020-06-28 DIAGNOSIS — R0602 Shortness of breath: Secondary | ICD-10-CM

## 2020-06-28 DIAGNOSIS — Z1211 Encounter for screening for malignant neoplasm of colon: Secondary | ICD-10-CM

## 2020-06-28 DIAGNOSIS — E785 Hyperlipidemia, unspecified: Secondary | ICD-10-CM | POA: Insufficient documentation

## 2020-06-28 DIAGNOSIS — Z13220 Encounter for screening for lipoid disorders: Secondary | ICD-10-CM

## 2020-06-28 DIAGNOSIS — F17200 Nicotine dependence, unspecified, uncomplicated: Secondary | ICD-10-CM

## 2020-06-28 DIAGNOSIS — Z131 Encounter for screening for diabetes mellitus: Secondary | ICD-10-CM

## 2020-06-28 DIAGNOSIS — R5382 Chronic fatigue, unspecified: Secondary | ICD-10-CM | POA: Insufficient documentation

## 2020-06-28 DIAGNOSIS — R5383 Other fatigue: Secondary | ICD-10-CM

## 2020-06-28 DIAGNOSIS — Z87891 Personal history of nicotine dependence: Secondary | ICD-10-CM | POA: Insufficient documentation

## 2020-06-28 DIAGNOSIS — Z1231 Encounter for screening mammogram for malignant neoplasm of breast: Secondary | ICD-10-CM

## 2020-06-28 DIAGNOSIS — R7303 Prediabetes: Secondary | ICD-10-CM | POA: Insufficient documentation

## 2020-06-28 DIAGNOSIS — M0579 Rheumatoid arthritis with rheumatoid factor of multiple sites without organ or systems involvement: Secondary | ICD-10-CM

## 2020-06-28 DIAGNOSIS — E1169 Type 2 diabetes mellitus with other specified complication: Secondary | ICD-10-CM | POA: Insufficient documentation

## 2020-06-28 HISTORY — DX: Encounter for screening for malignant neoplasm of colon: Z12.11

## 2020-06-28 HISTORY — DX: Other fatigue: R53.83

## 2020-06-28 HISTORY — DX: Encounter for screening mammogram for malignant neoplasm of breast: Z12.31

## 2020-06-28 LAB — GLUCOSE, CAPILLARY: Glucose-Capillary: 90 mg/dL (ref 70–99)

## 2020-06-28 LAB — POCT GLYCOSYLATED HEMOGLOBIN (HGB A1C): Hemoglobin A1C: 5.7 % — AB (ref 4.0–5.6)

## 2020-06-28 MED ORDER — CHANTIX STARTING MONTH PAK 0.5 MG X 11 & 1 MG X 42 PO TABS: ORAL_TABLET | ORAL | 0 refills | Status: DC

## 2020-06-28 MED ORDER — VARENICLINE TARTRATE 1 MG PO TABS: 1.0000 mg | ORAL_TABLET | Freq: Two times a day (BID) | ORAL | 1 refills | Status: DC

## 2020-06-28 NOTE — Assessment & Plan Note (Addendum)
Patient was treated for CAP last year. Since then she has been more SOB . SOB is noticed with minimal activity , like picking up her dog or carrying groceries. Not currently performing any daily aerobic activity. Denies chest pain with SOB. She has smoked for 40 years, 1/2ppd, for 20 pack year history. She does not currently have a diagnosis of COPD. Will further evaluate SOB with PFT's  Assessment: Shortness of breath Plan:  - Pulmonary function test; Future

## 2020-06-28 NOTE — Assessment & Plan Note (Signed)
  Screening for diabetes - POC Hbg A1C

## 2020-06-28 NOTE — Assessment & Plan Note (Signed)
Encounter for screening mammogram for malignant neoplasm of breast - Denies history of abnormal mammograms or family history of breast cancer - MM Digital Screening; Future

## 2020-06-28 NOTE — Assessment & Plan Note (Signed)
65 year old female, last lipid panel checked in 2019 with LDL 114 , HDL 55 . Patient is a current smoker. We will start Chantix today. She does not have HTN . Screening for diabetes today. Will follow up lipid panel and calculate ASCVD risk score.  Assessment: Screening for hyperlipidemia Plan: - CMP14 + Anion Gap - Lipid Profile

## 2020-06-28 NOTE — Assessment & Plan Note (Signed)
Patient currently smokes half a pack of cigarettes per day. She has smoked for 40 years. This gives her a 20 pack year history. She would like help quitting and Chantix has worked for her in the past. She has nicotine patches at home and she will use those if needed. We discussed lung cancer screening in the future with low dose CT Scan.   Assessment: Tobacco use disorder Plan: - varenicline (CHANTIX STARTING MONTH PAK) 0.5 MG X 11 & 1 MG X 42 tablet; Take one 0.5 mg tablet by mouth once daily for 3 days, then increase to one 0.5 mg tablet twice daily for 4 days, then increase to one 1 mg tablet twice daily.  Dispense: 53 tablet; Refill: 0 - varenicline (CHANTIX CONTINUING MONTH PAK) 1 MG tablet; Take 1 tablet (1 mg total) by mouth 2 (two) times daily.  Dispense: 30 tablet; Refill: 1

## 2020-06-28 NOTE — Assessment & Plan Note (Signed)
Screening for colon cancer - No family history of colon cancer. Age 65.  - Ambulatory referral to Gastroenterology

## 2020-06-28 NOTE — Patient Instructions (Signed)
Thank you for trusting me with your care. To recap, today we discussed the following:   1. Screening for colon cancer  - Ambulatory referral to Gastroenterology  2. Encounter for screening mammogram for malignant neoplasm of breast  - MM Digital Screening; Future  3. Shortness of breath  - Pulmonary function test; Future  4. Fatigue, unspecified type  - CBC with Diff - Ambulatory referral to Sleep Studies  5. Screening for hyperlipidemia  - CMP14 + Anion Gap - Lipid Profile  6. Screening for diabetes mellitus  - POC Hbg A1C

## 2020-06-28 NOTE — Progress Notes (Signed)
   CC: screening test, fatigue , and shortness of breath  HPI:Ms.Reta Norgren is a 65 y.o. female who presents for evaluation of screening test, fatigue and shortness of breath. Please see individual problem based A/P for details.  Depression, PHQ-9: Based on the patients  [place] Visit from 06/28/2020 in [place]  PHQ-9 Total Score 8     score we have could suggest mild depression. After discussion patient is not depressed, but has been fatigued recently.   Review of Systems:   Review of Systems  Constitutional: Positive for malaise/fatigue. Negative for chills and fever.  Respiratory: Positive for shortness of breath and wheezing. Negative for cough and sputum production.   Psychiatric/Behavioral: Negative for depression. The patient is not nervous/anxious.      Physical Exam: Vitals:   06/28/20 0953  BP: 124/77  Pulse: 87  Temp: 98.6 F (37 C)  TempSrc: Oral  SpO2: 97%  Weight: 179 lb (81.2 kg)  Height: 5\' 5"  (1.651 m)   General: Well kept, NAD HEENT: Conjunctiva nl , antiicteric sclerae, moist mucous membranes, no exudate or erythema Cardiovascular: Normal rate, regular rhythm.  No murmurs, rubs, or gallops Pulmonary : diminished lung sounds, No wheezes, rales, or rhonchi Abdominal: soft, nontender,  bowel sounds present Ext: No edema in lower extremities, no tenderness to palpation of lower extremities.   Assessment & Plan:   See Encounters Tab for problem based charting.  Patient discussed with Dr. Evette Doffing

## 2020-06-28 NOTE — Assessment & Plan Note (Signed)
Patient reports fatigue for months. She has a diagnosis of rheumatoid arthritsis , but this seems controlled at this time. She is on methotrexate, but also on a daily supplement of folate and B12. She also scores as a high risk on STOP-BANG for OSA and reports snoring.    Assessment: Fatigue, unspecified type. Check CBC for anemia. Will send for sleep study. Also further working up her shortness of breath. PHQ-9 and discussion did not suggest this is from depression. Plan:  - CBC with Diff  - Ambulatory referral to Sleep Studies

## 2020-06-29 LAB — CMP14 + ANION GAP
ALT: 20 IU/L (ref 0–32)
AST: 17 IU/L (ref 0–40)
Albumin/Globulin Ratio: 2 (ref 1.2–2.2)
Albumin: 4.6 g/dL (ref 3.8–4.8)
Alkaline Phosphatase: 104 IU/L (ref 44–121)
Anion Gap: 17 mmol/L (ref 10.0–18.0)
BUN/Creatinine Ratio: 20 (ref 12–28)
BUN: 15 mg/dL (ref 8–27)
Bilirubin Total: 0.7 mg/dL (ref 0.0–1.2)
CO2: 20 mmol/L (ref 20–29)
Calcium: 9.5 mg/dL (ref 8.7–10.3)
Chloride: 105 mmol/L (ref 96–106)
Creatinine, Ser: 0.75 mg/dL (ref 0.57–1.00)
Globulin, Total: 2.3 g/dL (ref 1.5–4.5)
Glucose: 89 mg/dL (ref 65–99)
Potassium: 4.3 mmol/L (ref 3.5–5.2)
Sodium: 142 mmol/L (ref 134–144)
Total Protein: 6.9 g/dL (ref 6.0–8.5)
eGFR: 89 mL/min/{1.73_m2} (ref >59)

## 2020-06-29 LAB — CBC WITH DIFFERENTIAL/PLATELET
Basophils Absolute: 0.1 10*3/uL (ref 0.0–0.2)
Basos: 1 %
EOS (ABSOLUTE): 0.1 10*3/uL (ref 0.0–0.4)
Eos: 1 %
Hematocrit: 46.7 % — ABNORMAL HIGH (ref 34.0–46.6)
Hemoglobin: 15.7 g/dL (ref 11.1–15.9)
Immature Grans (Abs): 0 10*3/uL (ref 0.0–0.1)
Immature Granulocytes: 1 %
Lymphocytes Absolute: 2.4 10*3/uL (ref 0.7–3.1)
Lymphs: 39 %
MCH: 31.7 pg (ref 26.6–33.0)
MCHC: 33.6 g/dL (ref 31.5–35.7)
MCV: 94 fL (ref 79–97)
Monocytes Absolute: 0.5 10*3/uL (ref 0.1–0.9)
Monocytes: 8 %
Neutrophils Absolute: 3.1 10*3/uL (ref 1.4–7.0)
Neutrophils: 50 %
Platelets: 236 10*3/uL (ref 150–450)
RBC: 4.96 x10E6/uL (ref 3.77–5.28)
RDW: 13.8 % (ref 11.7–15.4)
WBC: 6.2 10*3/uL (ref 3.4–10.8)

## 2020-06-29 LAB — LIPID PANEL
Chol/HDL Ratio: 3.2 ratio (ref 0.0–4.4)
Cholesterol, Total: 211 mg/dL — ABNORMAL HIGH (ref 100–199)
HDL: 66 mg/dL (ref >39)
LDL Chol Calc (NIH): 123 mg/dL — ABNORMAL HIGH (ref 0–99)
Triglycerides: 128 mg/dL (ref 0–149)
VLDL Cholesterol Cal: 22 mg/dL (ref 5–40)

## 2020-07-01 ENCOUNTER — Telehealth: Payer: Self-pay | Admitting: Internal Medicine

## 2020-07-01 NOTE — Progress Notes (Signed)
Internal Medicine Clinic Attending  Case discussed with Dr. Steen  At the time of the visit.  We reviewed the resident's history and exam and pertinent patient test results.  I agree with the assessment, diagnosis, and plan of care documented in the resident's note.  

## 2020-07-01 NOTE — Telephone Encounter (Signed)
The 10-year ASCVD risk score Mikey Bussing DC Brooke Bonito., et al., 2013) is: 7.7%   Values used to calculate the score:     Age: 65 years     Sex: Female     Is Non-Hispanic African American: No     Diabetic: No     Tobacco smoker: Yes     Systolic Blood Pressure: 938 mmHg     Is BP treated: No     HDL Cholesterol: 66 mg/dL     Total Cholesterol: 211 mg/dL   I discussed patients's risk factors and risk score above with her. We will hold off on starting a statin at this time. Patient is currently on chantix and will lower her risk score by quitting.  We can consider checking her lipid panel fasting at her next annual physical.   We also reviewed her other lab work, no further treatment or workup at this time.

## 2020-07-04 ENCOUNTER — Encounter: Payer: Self-pay | Admitting: Physician Assistant

## 2020-07-26 ENCOUNTER — Encounter: Payer: Self-pay | Admitting: *Deleted

## 2020-07-31 ENCOUNTER — Encounter: Payer: Self-pay | Admitting: Physician Assistant

## 2020-07-31 ENCOUNTER — Ambulatory Visit (INDEPENDENT_AMBULATORY_CARE_PROVIDER_SITE_OTHER): Payer: 59 | Admitting: Physician Assistant

## 2020-07-31 VITALS — BP 130/80 | HR 80 | Ht 65.0 in | Wt 176.0 lb

## 2020-07-31 DIAGNOSIS — Z1211 Encounter for screening for malignant neoplasm of colon: Secondary | ICD-10-CM

## 2020-07-31 DIAGNOSIS — K219 Gastro-esophageal reflux disease without esophagitis: Secondary | ICD-10-CM | POA: Diagnosis not present

## 2020-07-31 DIAGNOSIS — Z1212 Encounter for screening for malignant neoplasm of rectum: Secondary | ICD-10-CM | POA: Diagnosis not present

## 2020-07-31 NOTE — Progress Notes (Signed)
Reviewed and agree with management plans. ? ?Zoella Roberti L. Jonnie Kubly, MD, MPH  ?

## 2020-07-31 NOTE — Patient Instructions (Signed)
If you are age 63 or older, your body mass index should be between 23-30. Your Body mass index is 29.29 kg/m. If this is out of the aforementioned range listed, please consider follow up with your Primary Care Provider.  If you are age 37 or younger, your body mass index should be between 19-25. Your Body mass index is 29.29 kg/m. If this is out of the aformentioned range listed, please consider follow up with your Primary Care Provider.   You have been scheduled for a colonoscopy. Please follow written instructions given to you at your visit today.  Please pick up your prep supplies at the pharmacy within the next 1-3 days. If you use inhalers (even only as needed), please bring them with you on the day of your procedure.   The [facility] providers would like to encourage you to use [facility] to communicate with providers for non-urgent requests or questions.  Due to long hold times on the telephone, sending your provider a message by [facility] may be a faster and more efficient way to get a response.  Please allow 48 business hours for a response.  Please remember that this is for non-urgent requests.   Thank you for choosing me and [facility].  Ellouise Newer, PA-C

## 2020-07-31 NOTE — Progress Notes (Signed)
Chief Complaint: Screening for colorectal cancer, reflux  HPI:    Heidi Chapman is a 65 year old female with a past medical history as listed below including status post lap cholecystectomy 10/24/2017, who was referred to me by Modena Nunnery D, DO for a complaint of screening for colorectal cancer and reflux.      06/28/2020 patient saw PCP.  At that time referred to [place] for colon cancer screening.    Today, the patient presents to clinic and tells me that she is overdue for screening colonoscopy.  Her last was done in her 24s, she is not exactly sure where or by who but tells me that it was normal.      Also discusses her chronic reflux symptoms today.  Apparently these were much worse prior to having her gallbladder taken out in 2019.  Now she is only having to use Tagamet as needed and that is only once every 3 to 4 weeks pending on what she eats.    She has 2 [place] at home.    Denies fever, chills, weight loss, change in bowel habits or abdominal pain.      Past Medical History:  Diagnosis Date   Arthritis    GERD (gastroesophageal reflux disease)     Past Surgical History:  Procedure Laterality Date   CESAREAN SECTION     CHOLECYSTECTOMY N/A 10/24/2017   Procedure: LAPAROSCOPIC CHOLECYSTECTOMY WITH INTRAOPERATIVE CHOLANGIOGRAM;  Surgeon: Erroll Luna, MD;  Location: [facility];  Service: General;  Laterality: N/A;    Current Outpatient Medications  Medication Sig Dispense Refill   cimetidine (TAGAMET) 200 MG tablet Take 200 mg by mouth 2 (two) times daily.     cyclobenzaprine (FLEXERIL) 10 MG tablet 1 tablet at bedtime as needed     Dietary Management Product (RHEUMATE) CAPS Take 1 capsule by mouth daily.     fluconazole (DIFLUCAN) 200 MG tablet Take 1 tablet once if you develop symptoms of a yeast infection. 1 tablet 0   fluticasone (FLONASE) 50 MCG/ACT nasal spray Place 2 sprays into both nostrils daily. 16 g 0   folic acid (FOLVITE) 1 MG tablet 1 tablet      golimumab (SIMPONI ARIA) 50 MG/4ML SOLN injection 2mg /kg     Methotrexate 25 MG/ML SOSY Inject 0.6 mLs into the skin once a week.     Multiple Vitamins-Minerals (MULTIVITAMIN WOMEN 50+) TABS See admin instructions.     No current facility-administered medications for this visit.    Allergies as of 07/31/2020   (No Known Allergies)    Family History  Problem Relation Age of Onset   Colon polyps Father    Prostate cancer Father    Diverticulitis Father     Social History   Socioeconomic History   Marital status: Legally Separated    Spouse name: Not on file   Number of children: Not on file   Years of education: Not on file   Highest education level: Not on file  Occupational History   Not on file  Tobacco Use   Smoking status: Every Day    Packs/day: 0.50    Pack years: 0.00    Types: Cigarettes   Smokeless tobacco: Never   Tobacco comments:    1/2 pk a day.  Wants Chantex  Substance and Sexual Activity   Alcohol use: Never   Drug use: Never   Sexual activity: Not on file  Other Topics Concern   Not on file  Social History Narrative   Not  on file   Social Determinants of Health   Financial Resource Strain: Not on file  Food Insecurity: Not on file  Transportation Needs: Not on file  Physical Activity: Not on file  Stress: Not on file  Social Connections: Not on file  Intimate Partner Violence: Not on file    Review of Systems:    Constitutional: No weight loss, fever or chills Skin: No rash Cardiovascular: No chest pain Respiratory: No SOB  Gastrointestinal: See HPI and otherwise negative Genitourinary: No dysuria  Neurological: No headache, dizziness or syncope Musculoskeletal: No new muscle or joint pain Hematologic: No bleeding  Psychiatric: No history of depression or anxiety   Physical Exam:  Vital signs: BP 130/80   Pulse 80   Ht 5\' 5"  (1.651 m)   Wt 176 lb (79.8 kg)   SpO2 96%   BMI 29.29 kg/m   Constitutional:   Pleasant Caucasian  female appears to be in NAD, Well developed, Well nourished, alert and cooperative Head:  Normocephalic and atraumatic. Eyes:   PEERL, EOMI. No icterus. Conjunctiva pink. Ears:  Normal auditory acuity. Neck:  Supple Throat: Oral cavity and pharynx without inflammation, swelling or lesion.  Respiratory: Respirations even and unlabored. Lungs clear to auscultation bilaterally.   No wheezes, crackles, or rhonchi.  Cardiovascular: Normal S1, S2. No MRG. Regular rate and rhythm. No peripheral edema, cyanosis or pallor.  Gastrointestinal:  Soft, nondistended, nontender. No rebound or guarding. Normal bowel sounds. No appreciable masses or hepatomegaly. Rectal:  Not performed.  Msk:  Symmetrical without gross deformities. Without edema, no deformity or joint abnormality.  Neurologic:  Alert and  oriented x4;  grossly normal neurologically.  Skin:   Dry and intact without significant lesions or rashes. Psychiatric: Demonstrates good judgement and reason without abnormal affect or behaviors.  RELEVANT LABS AND IMAGING: CBC    Component Value Date/Time   WBC 6.2 06/28/2020 1117   WBC 7.7 10/25/2017 0531   RBC 4.96 06/28/2020 1117   RBC 4.16 10/25/2017 0531   HGB 15.7 06/28/2020 1117   HCT 46.7 (H) 06/28/2020 1117   PLT 236 06/28/2020 1117   MCV 94 06/28/2020 1117   MCH 31.7 06/28/2020 1117   MCH 29.1 10/25/2017 0531   MCHC 33.6 06/28/2020 1117   MCHC 32.7 10/25/2017 0531   RDW 13.8 06/28/2020 1117   LYMPHSABS 2.4 06/28/2020 1117   MONOABS 0.6 10/23/2017 0321   EOSABS 0.1 06/28/2020 1117   BASOSABS 0.1 06/28/2020 1117    CMP     Component Value Date/Time   NA 142 06/28/2020 1117   K 4.3 06/28/2020 1117   CL 105 06/28/2020 1117   CO2 20 06/28/2020 1117   GLUCOSE 89 06/28/2020 1117   GLUCOSE 123 (H) 10/25/2017 0531   BUN 15 06/28/2020 1117   CREATININE 0.75 06/28/2020 1117   CALCIUM 9.5 06/28/2020 1117   PROT 6.9 06/28/2020 1117   ALBUMIN 4.6 06/28/2020 1117   AST 17 06/28/2020  1117   ALT 20 06/28/2020 1117   ALKPHOS 104 06/28/2020 1117   BILITOT 0.7 06/28/2020 1117   GFRNONAA 73 01/26/2019 1102   GFRAA 84 01/26/2019 1102    Assessment: 1.  Screening for colorectal cancer: Patient is 44 and has not had a colonoscopy since she turned 47 2.  GERD: Better after cholecystectomy in 2019, now only as needed use of Tagamet which is once every 3 to 4 weeks  Plan: 1.  Scheduled patient for screening colonoscopy in the [place].  She requests Dr.  Beavers today.  Did provide the patient a detailed list of risks for procedure and she agrees to proceed. 2.  Continue Tagamet as needed for GERD 3.  Patient to follow in clinic per recommendations from Dr. Tarri Glenn after time of procedure.  Ellouise Newer, PA-C [facility] 07/31/2020, 10:37 AM  Cc: [place], [place] D, DO

## 2020-08-22 ENCOUNTER — Telehealth: Payer: Self-pay | Admitting: *Deleted

## 2020-08-22 NOTE — Telephone Encounter (Addendum)
Patient was called and informed of appointment for PFT' on 08/29/18 22 at 10:00 AM pt to arrive by 9:45 AM.Covid Testing to be done 08/26/2020 10:15 AM at [address] [place]). Patient voiced understanding of plan.Sander Nephew, RN 08/22/2020 4:05 PM.

## 2020-08-22 NOTE — Addendum Note (Signed)
Addended by: Marcelino Duster on: 08/22/2020 10:05 AM   Modules accepted: Orders

## 2020-08-22 NOTE — Telephone Encounter (Incomplete Revision)
Patient was called and informed of appointment for PFT' on 08/29/18 22 at 10:00 AM pt to arrive by 9:45 AM.Covid Testing to be done 08/26/2020 10:15 AM at [address] [place]). Patient voiced understanding of plan.Sander Nephew, RN 08/22/2020 4:05 PM.

## 2020-08-26 ENCOUNTER — Other Ambulatory Visit (HOSPITAL_COMMUNITY)
Admission: RE | Admit: 2020-08-26 | Discharge: 2020-08-26 | Disposition: A | Discharge status: Home / Self Care | Payer: 59 | Source: Ambulatory Visit | Attending: Internal Medicine | Admitting: Internal Medicine

## 2020-08-26 DIAGNOSIS — Z20822 Contact with and (suspected) exposure to covid-19: Secondary | ICD-10-CM | POA: Insufficient documentation

## 2020-08-26 DIAGNOSIS — Z01812 Encounter for preprocedural laboratory examination: Secondary | ICD-10-CM | POA: Insufficient documentation

## 2020-08-26 LAB — SARS CORONAVIRUS 2 (TAT 6-24 HRS): SARS Coronavirus 2: NEGATIVE

## 2020-08-28 ENCOUNTER — Ambulatory Visit (HOSPITAL_COMMUNITY)
Admission: RE | Admit: 2020-08-28 | Discharge: 2020-08-28 | Disposition: A | Discharge status: Home / Self Care | Payer: 59 | Source: Ambulatory Visit | Attending: Internal Medicine | Admitting: Internal Medicine

## 2020-08-28 ENCOUNTER — Other Ambulatory Visit: Payer: Self-pay

## 2020-08-28 DIAGNOSIS — R0602 Shortness of breath: Secondary | ICD-10-CM | POA: Diagnosis present

## 2020-08-28 LAB — PULMONARY FUNCTION TEST
DL/VA % pred: 82 %
DL/VA: 3.41 ml/min/mmHg/L
DLCO unc % pred: 73 %
DLCO unc: 15.72 ml/min/mmHg
FEF 25-75 Post: 2.28 L/sec
FEF 25-75 Pre: 1.69 L/sec
FEF2575-%Change-Post: 35 %
FEF2575-%Pred-Post: 97 %
FEF2575-%Pred-Pre: 72 %
FEV1-%Change-Post: 11 %
FEV1-%Pred-Post: 95 %
FEV1-%Pred-Pre: 85 %
FEV1-Post: 2.54 L
FEV1-Pre: 2.29 L
FEV1FVC-%Change-Post: 9 %
FEV1FVC-%Pred-Pre: 90 %
FEV6-%Change-Post: 3 %
FEV6-%Pred-Post: 99 %
FEV6-%Pred-Pre: 96 %
FEV6-Post: 3.31 L
FEV6-Pre: 3.21 L
FEV6FVC-%Change-Post: 1 %
FEV6FVC-%Pred-Post: 103 %
FEV6FVC-%Pred-Pre: 102 %
FVC-%Change-Post: 1 %
FVC-%Pred-Post: 95 %
FVC-%Pred-Pre: 93 %
FVC-Post: 3.31 L
FVC-Pre: 3.26 L
Post FEV1/FVC ratio: 77 %
Post FEV6/FVC ratio: 100 %
Pre FEV1/FVC ratio: 70 %
Pre FEV6/FVC Ratio: 98 %
RV % pred: 101 %
RV: 2.18 L
TLC % pred: 100 %
TLC: 5.37 L

## 2020-08-28 MED ORDER — ALBUTEROL SULFATE (2.5 MG/3ML) 0.083% IN NEBU
2.5000 mg | INHALATION_SOLUTION | Freq: Once | RESPIRATORY_TRACT | Status: AC
  Administered 2020-08-28: 2.5 mg via RESPIRATORY_TRACT

## 2020-08-29 ENCOUNTER — Ambulatory Visit (INDEPENDENT_AMBULATORY_CARE_PROVIDER_SITE_OTHER): Payer: 59 | Admitting: Neurology

## 2020-08-29 ENCOUNTER — Other Ambulatory Visit: Payer: Self-pay

## 2020-08-29 ENCOUNTER — Encounter: Payer: Self-pay | Admitting: Neurology

## 2020-08-29 VITALS — BP 129/80 | HR 78 | Ht 65.0 in | Wt 180.5 lb

## 2020-08-29 DIAGNOSIS — R0683 Snoring: Secondary | ICD-10-CM

## 2020-08-29 DIAGNOSIS — G4719 Other hypersomnia: Secondary | ICD-10-CM | POA: Diagnosis not present

## 2020-08-29 DIAGNOSIS — R0602 Shortness of breath: Secondary | ICD-10-CM

## 2020-08-29 DIAGNOSIS — G4733 Obstructive sleep apnea (adult) (pediatric): Secondary | ICD-10-CM | POA: Insufficient documentation

## 2020-08-29 DIAGNOSIS — F17200 Nicotine dependence, unspecified, uncomplicated: Secondary | ICD-10-CM

## 2020-08-29 DIAGNOSIS — R0601 Orthopnea: Secondary | ICD-10-CM

## 2020-08-29 HISTORY — DX: Shortness of breath: R06.02

## 2020-08-29 HISTORY — DX: Orthopnea: R06.01

## 2020-08-29 HISTORY — DX: Snoring: R06.83

## 2020-08-29 NOTE — Progress Notes (Signed)
SLEEP MEDICINE CLINIC    Provider:  Larey Seat, MD  Primary Care Physician:  Farrel Gordon, DO [address] [place] [address]     Referring Provider: Madalyn Rob, MD         Chief Complaint according to patient   Patient presents with:     New Patient (Initial Visit)     alone. Here today for fatigue, sleepiness, SOB, low energy and snoring concerns.ongoing for a year and has worsened overtime. Pt had a pulmonary function test yesterday. Pt has had pneumonia 3x within the last 2 years. pt ishere to see if her RA in causing these conditions.       HISTORY OF PRESENT ILLNESS:  Heidi Chapman is a 65 y.o. year old White or Caucasian female patient . She is seen 08-29-2020 for a sleep apnea evaluation.   Chief concern according to patient :  sleepiness and tiredness , ended up with pneumonia several times  and was finally diagnosed with rheumatoid arthritis. 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    I have the pleasure of seeing Heidi Chapman today, a right-handed White or Caucasian female with a possible sleep disorder. She  has a past medical history of Anxiety, Arthritis, RF positive, ANA negative , GERD (gastroesophageal reflux disease), and Migraine.   Sleep relevant medical history: sleepwalker in childhood age 89- ongoing to 36, concussion at age 26,TBI,  cervical spine DDD.     Family medical /sleep history: No other family member on CPAP with OSA, insomnia, sleep walkers.    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  Pets : 6 cats and 3 dogs are present. Tobacco use; 10 cig a day.   ETOH use ; seldomly ,  Caffeine intake in form of Coffee( 1-2 cups) Soda( 2) Tea ( /) or energy drinks. Regular exercise  limited.         Sleep habits are as follows: The patient's dinner time is between  PM. The patient goes  to bed at 12 PM and continues to sleep for 6 hours, wakes for 2 bathroom breaks, the first time at 2-3 AM.  The preferred sleep position is elevated for GERD- , with the support of 0 pillows. Dreams are reportedly frequent/vivid.  6.30 AM is the usual rise time. The patient wakes up with her dogs. .  She reports not feeling refreshed or restored in AM, with symptoms such as dry mouth, rare morning headaches, and frequent residual fatigue. Naps are taken daily-frequently, lasting from 1-2 hours and are more refreshing than nocturnal sleep.    Review of Systems: Out of a complete 14 system review, the patient complains of only the following symptoms, and all other reviewed systems are negative.:  Fatigue, sleepiness , snoring, fragmented sleep,      How likely are you to doze in the following situations: 0 = not likely, 1 = slight chance, 2 = moderate chance, 3 = high chance   Sitting and Reading? Watching Television? Sitting inactive in a public place (theater or meeting)? As a passenger in a car for an hour without a break? Lying down in the afternoon when circumstances permit? Sitting and talking to someone? Sitting quietly after lunch without alcohol? In a car, while stopped for a few minutes in traffic?   Total = 15/ 24 points   FSS  endorsed at 57/ 63 points.   Myalgia, arthralgia,   Social History   Socioeconomic History   Marital status: Legally Separated    Spouse name: Not on file   Number of children: Not on file   Years of education: Not on file   Highest education level: Not on file  Occupational History   Not on file  Tobacco Use   Smoking status: Every Day    Packs/day: 0.50    Types: Cigarettes   Smokeless tobacco: Never   Tobacco comments:    1/2 pk a day.  Wants Chantex  Substance and Sexual Activity   Alcohol use: Yes    Comment: 1-3 drinks annually   Drug use: Never   Sexual activity: Not on file  Other Topics Concern   Not on file  Social History  Narrative   Lives with youngest daughter and granddaughter   Right handed   Caffeine: 1 cup of coffee and 1 soda daily   Social Determinants of Health   Financial Resource Strain: Not on file  Food Insecurity: Not on file  Transportation Needs: Not on file  Physical Activity: Not on file  Stress: Not on file  Social Connections: Not on file    Family History  Problem Relation Age of Onset   Cancer - Lung Mother    Brain cancer Mother    Colon polyps Father    Prostate cancer Father    Diverticulitis Father    Heart attack Father    Cancer Maternal Grandmother    Cancer Maternal Grandfather    Cancer Paternal Grandfather     Past Medical History:  Diagnosis Date   Anxiety    Arthritis    Fibromyalgia    GERD (gastroesophageal reflux disease)    Migraine     Past Surgical History:  Procedure Laterality Date   CESAREAN SECTION     CHOLECYSTECTOMY N/A 10/24/2017   Procedure: LAPAROSCOPIC CHOLECYSTECTOMY WITH INTRAOPERATIVE CHOLANGIOGRAM;  Surgeon: Erroll Luna, MD;  Location: [facility];  Service: General;  Laterality: N/A;   TUBAL LIGATION       Current Outpatient Medications on File Prior to Visit  Medication Sig Dispense Refill   cimetidine (TAGAMET) 200 MG tablet Take 200 mg by mouth as needed.     cyclobenzaprine (FLEXERIL) 10 MG tablet 1 tablet at bedtime as needed     Dietary Management Product (RHEUMATE) CAPS Take 1 capsule by mouth daily.     fluticasone (FLONASE) 50 MCG/ACT nasal spray Place 2 sprays into both nostrils daily. (Patient taking differently: Place 2 sprays into both nostrils as needed.) 16 g 0   folic acid (FOLVITE) 1 MG tablet 1 tablet     golimumab (SIMPONI ARIA) 50 MG/4ML SOLN injection 2mg /kg     Methotrexate 25 MG/ML SOSY Inject 0.8 mLs into the skin once a week.     Multiple Vitamins-Minerals (MULTIVITAMIN WOMEN 50+) TABS See admin instructions.     No current facility-administered medications on file prior to visit.    No Known  Allergies  Physical exam:  Today's Vitals   08/29/20 1502  BP: 129/80  Pulse: 78  Weight: 180 lb 8 oz (81.9 kg)  Height: 5\' 5"  (1.651 m)   Body mass index is 30.04 kg/m.   Wt Readings from Last 3 Encounters:  08/29/20 180 lb 8 oz (81.9 kg)  07/31/20 176 lb (79.8 kg)  06/28/20 179 lb (81.2 kg)     Ht Readings from Last 3 Encounters:  08/29/20 5'  5" (1.651 m)  07/31/20 5\' 5"  (1.651 m)  06/28/20 5\' 5"  (1.651 m)      General: The patient is awake, alert and appears not in acute distress. The patient is well groomed. Head: Normocephalic, atraumatic. Neck is supple. Mallampati 2,  neck circumference:14.5  inches . Nasal airflow patent. Hoarseness-  Retrognathia is  seen.  Dental status: biological full teeth.  Cardiovascular:  Regular rate and cardiac rhythm by pulse,  without distended neck veins. Respiratory: Lungs are clear to auscultation.  Skin:  Without evidence of ankle edema, or rash. Trunk: The patient's posture is erect.   Neurologic exam : The patient is awake and alert, oriented to place and time.   Memory subjective described as intact.  Attention span & concentration ability appears normal.  Speech is fluent,   with dysphonia .  Mood and affect are appropriate.   Cranial nerves: no loss of smell or taste reported - never had COVID-  Pupils are equal and briskly reactive to light. Funduscopic exam deferred. Dry eyes. .  Extraocular movements in vertical and horizontal planes were intact and without nystagmus. No Diplopia. Visual fields by finger perimetry are intact. Hearing was intact to soft voice and finger rubbing.   Facial sensation intact to fine touch.  Facial motor strength is symmetric and tongue and uvula move midline.  Neck ROM : rotation, tilt and flexion extension were normal for age and shoulder shrug was symmetrical.    Motor exam:  Symmetric bulk, tone and ROM.   Normal tone without cog wheeling, symmetric grip strength .   Sensory:  Fine  touch and vibration were normal.  Proprioception tested in the upper extremities was normal.   Coordination: Rapid alternating movements in the fingers/hands were of normal speed.  The Finger-to-nose maneuver was intact without evidence of ataxia, dysmetria or tremor.   Gait and station: Patient could rise unassisted from a seated position, walked without assistive device.  Stance is of normal width/ base and the patient turned with 3-4 steps.  Toe and heel walk were deferred.  Deep tendon reflexes: in the  upper and lower extremities are symmetric and intact.  Babinski response was deferred .        After spending a total time of 40 minutes face to face and additional time for physical and neurologic examination, review of laboratory studies,  personal review of imaging studies, reports and results of other testing and review of referral information / records as far as provided in visit, I have established the following assessments:  Mrs. Mallott presents with a history of seasonal allergies 3 infections of pneumonia within 24 months and a fairly recent diagnosis of rheumatoid arthritis by lab value RF positive ANA negative in 2020.  She is a smoker of a half a pack daily she does not drink alcohol very infrequently she does drink coffee or soda with caffeine to keep herself from falling asleep but she cannot afford.  She usually takes a nap that may be 1 or 2 hours long but she also gets about 6 hours of nocturnal sleep.  Her fatigue has been severe but also her daytime sleepiness is excessive as documented in the Epworth sleepiness score.  She was placed on Chantix in May and learnt the medication was pulled of the market. She had sleepwalking with Chantix and vivid dreams.  She did not tolerate Wellbutrin , gained weight.      1)  EDS, Fatigue and loud witnessed snoring with apnea.  2)  autoimmune condition 3) pneumonia times 3 in a smoker. Just seen by respiratory therapy. She  interrupts her speech to gasp for breath. No wheezing, no asthma, no COPD listed.    My Plan is to proceed with:  1) HST  versus PSG.   I would like to thank Farrel Gordon, DO and [place], [place] D, Do No address on file for allowing me to meet with and to take care of this pleasant patient.   In short, Blythe Veach is presenting with EDS, RF positive , active nicotine use-, and possible apnea.  I plan to follow up either personally or through our NP within 2-4 month.   CC: I will share my notes with PCP .  Electronically signed by: Larey Seat, MD 08/29/2020 3:43 PM  [name] Board certified by The [employer] of Sleep Medicine and Diplomate of the [employer] of Sleep Medicine. Board certified In Neurology through the [employer], Fellow of the [employer] of Neurology. Medical Director of [facility].

## 2020-08-29 NOTE — Patient Instructions (Signed)
Sleep Apnea Sleep apnea is a condition in which breathing pauses or becomes shallow during sleep. People with sleep apnea usually snore loudly. They may have times when they gasp and stop breathing for 10 seconds or more during sleep. This mayhappen many times during the night. Sleep apnea disrupts your sleep and keeps your body from getting the rest that it needs. This condition can increase your risk of certain health problems, including: Heart attack. Stroke. Obesity. Type 2 diabetes. Heart failure. Irregular heartbeat. High blood pressure. The goal of treatment is to help you breathe normally again. What are the causes? The most common cause of sleep apnea is a collapsed or blocked airway. There are three kinds of sleep apnea: Obstructive sleep apnea. This kind is caused by a blocked or collapsed airway. Central sleep apnea. This kind happens when the part of the brain that controls breathing does not send the correct signals to the muscles that control breathing. Mixed sleep apnea. This is a combination of obstructive and central sleep apnea. What increases the risk? You are more likely to develop this condition if you: Are overweight. Smoke. Have a smaller than normal airway. Are older. Are female. Drink alcohol. Take sedatives or tranquilizers. Have a family history of sleep apnea. Have a tongue or tonsils that are larger than normal. What are the signs or symptoms? Symptoms of this condition include: Trouble staying asleep. Loud snoring. Morning headaches. Waking up gasping. Dry mouth or sore throat in the morning. Daytime sleepiness and tiredness. If you have daytime fatigue because of sleep apnea, you may be more likely to have: Trouble concentrating. Forgetfulness. Irritability or mood swings. Personality changes. Feelings of depression. Sexual dysfunction. This may include loss of interest if you are female, or erectile dysfunction if you are female. How is this  diagnosed? This condition may be diagnosed with: A medical history. A physical exam. A series of tests that are done while you are sleeping (sleep study). These tests are usually done in a sleep lab, but they may also be done at home. How is this treated? Treatment for this condition aims to restore normal breathing and to ease symptoms during sleep. It may involve managing health issues that can affect breathing, such as high blood pressure or obesity. Treatment may include: Sleeping on your side. Using a decongestant if you have nasal congestion. Avoiding the use of depressants, including alcohol, sedatives, and narcotics. Losing weight if you are overweight. Making changes to your diet. Quitting smoking. Using a device to open your airway while you sleep, such as: An oral appliance. This is a custom-made mouthpiece that shifts your lower jaw forward. A continuous positive airway pressure (CPAP) device. This device blows air through a mask when you breathe out (exhale). A nasal expiratory positive airway pressure (EPAP) device. This device has valves that you put into each nostril. A bi-level positive airway pressure (BPAP) device. This device blows air through a mask when you breathe in (inhale) and breathe out (exhale). Having surgery if other treatments do not work. During surgery, excess tissue is removed to create a wider airway. Follow these instructions at home: Lifestyle Make any lifestyle changes that your health care provider recommends. Eat a healthy, well-balanced diet. Take steps to lose weight if you are overweight. Avoid using depressants, including alcohol, sedatives, and narcotics. Do not use any products that contain nicotine or tobacco. These products include cigarettes, chewing tobacco, and vaping devices, such as e-cigarettes. If you need help quitting, ask your health  care provider. General instructions Take over-the-counter and prescription medicines only as told  by your health care provider. If you were given a device to open your airway while you sleep, use it only as told by your health care provider. If you are having surgery, make sure to tell your health care provider you have sleep apnea. You may need to bring your device with you. Keep all follow-up visits. This is important. Contact a health care provider if: The device that you received to open your airway during sleep is uncomfortable or does not seem to be working. Your symptoms do not improve. Your symptoms get worse. Get help right away if: You develop: Chest pain. Shortness of breath. Discomfort in your back, arms, or stomach. You have: Trouble speaking. Weakness on one side of your body. Drooping in your face. These symptoms may represent a serious problem that is an emergency. Do not wait to see if the symptoms will go away. Get medical help right away. Call your local emergency services (911 in the [place] Do not drive yourself to the hospital. Summary Sleep apnea is a condition in which breathing pauses or becomes shallow during sleep. The most common cause is a collapsed or blocked airway. The goal of treatment is to restore normal breathing and to ease symptoms during sleep. This information is not intended to replace advice given to you by your health care provider. Make sure you discuss any questions you have with your healthcare provider. Document Revised: 01/12/2020 Document Reviewed: 01/12/2020 Elsevier Patient Education  2022 [employer].

## 2020-09-05 ENCOUNTER — Encounter: Payer: Self-pay | Admitting: Gastroenterology

## 2020-09-05 ENCOUNTER — Other Ambulatory Visit: Payer: Self-pay

## 2020-09-05 ENCOUNTER — Ambulatory Visit (AMBULATORY_SURGERY_CENTER): Payer: 59 | Admitting: Gastroenterology

## 2020-09-05 VITALS — BP 119/68 | HR 67 | Temp 98.4°F | Resp 12 | Ht 65.0 in | Wt 176.0 lb

## 2020-09-05 DIAGNOSIS — Z1212 Encounter for screening for malignant neoplasm of rectum: Secondary | ICD-10-CM

## 2020-09-05 DIAGNOSIS — Z1211 Encounter for screening for malignant neoplasm of colon: Secondary | ICD-10-CM

## 2020-09-05 DIAGNOSIS — D123 Benign neoplasm of transverse colon: Secondary | ICD-10-CM

## 2020-09-05 DIAGNOSIS — D128 Benign neoplasm of rectum: Secondary | ICD-10-CM | POA: Diagnosis not present

## 2020-09-05 DIAGNOSIS — K621 Rectal polyp: Secondary | ICD-10-CM | POA: Diagnosis not present

## 2020-09-05 MED ORDER — SODIUM CHLORIDE 0.9 % IV SOLN: 500.0000 mL | Freq: Once | INTRAVENOUS | Status: DC

## 2020-09-05 NOTE — Op Note (Signed)
[facility] Patient Name: Maie Kesinger Procedure Date: 09/05/2020 3:15 PM MRN: [id number] Endoscopist: Thornton Park MD, MD Age: 65 Referring MD:  Date of Birth: 03/23/1955 Gender: Female Account #: [id number] Procedure:                Colonoscopy Indications:              Screening for colorectal malignant neoplasm, This                            is the patient's first colonoscopy                           No known family history of colon cancer or polyps Medicines:                Monitored Anesthesia Care Procedure:                Pre-Anesthesia Assessment:                           - Prior to the procedure, a History and Physical                            was performed, and patient medications and                            allergies were reviewed. The patient's tolerance of                            previous anesthesia was also reviewed. The risks                            and benefits of the procedure and the sedation                            options and risks were discussed with the patient.                            All questions were answered, and informed consent                            was obtained. Prior Anticoagulants: The patient has                            taken no previous anticoagulant or antiplatelet                            agents. ASA Grade Assessment: II - A patient with                            mild systemic disease. After reviewing the risks                            and benefits, the patient was deemed in  satisfactory condition to undergo the procedure.                           After obtaining informed consent, the colonoscope                            was passed under direct vision. Throughout the                            procedure, the patient's blood pressure, pulse, and                            oxygen saturations were monitored continuously. The                            Olympus CF-HQ190L  [id number]) Colonoscope was                            introduced through the anus and advanced to the 3                            cm into the ileum. A second forward view of the                            right colon was performed. The colonoscopy was                            performed without difficulty. The patient tolerated                            the procedure well. The quality of the bowel                            preparation was good. The terminal ileum, ileocecal                            valve, appendiceal orifice, and rectum were                            photographed. Scope In: 3:30:20 PM Scope Out: 3:49:28 PM Scope Withdrawal Time: 0 hours 15 minutes 48 seconds  Total Procedure Duration: 0 hours 19 minutes 8 seconds  Findings:                 The perianal and digital rectal examinations were                            normal.                           A few small and large-mouthed diverticula were                            found in the sigmoid colon.  A 1 mm polyp was found in the rectum. The polyp was                            sessile. The polyp was removed with a cold snare.                            Resection and retrieval were complete. Estimated                            blood loss was minimal.                           A 2 mm polyp was found in the hepatic flexure. The                            polyp was sessile. The polyp was removed with a                            piecemeal technique using a cold snare. Resection                            and retrieval were complete. Estimated blood loss                            was minimal.                           The exam was otherwise without abnormality on                            direct and retroflexion views. Complications:            No immediate complications. Estimated blood loss:                            Minimal. Estimated Blood Loss:     Estimated blood loss was  minimal. Impression:               - Diverticulosis in the sigmoid colon.                           - One 1 mm polyp in the rectum, removed with a cold                            snare. Resected and retrieved.                           - One 2 mm polyp at the hepatic flexure, removed                            piecemeal using a cold snare. Resected and                            retrieved.                           -  The examination was otherwise normal on direct                            and retroflexion views. Recommendation:           - Patient has a contact number available for                            emergencies. The signs and symptoms of potential                            delayed complications were discussed with the                            patient. Return to normal activities tomorrow.                            Written discharge instructions were provided to the                            patient.                           - Resume previous diet.                           - Continue present medications.                           - Await pathology results.                           - Repeat colonoscopy date to be determined after                            pending pathology results are reviewed for                            surveillance.                           - Emerging evidence supports eating a diet of                            fruits, vegetables, grains, calcium, and yogurt                            while reducing red meat and alcohol may reduce the                            risk of colon cancer.                           - Thank you for allowing me to be involved in your  colon cancer prevention. Thornton Park MD, MD 09/05/2020 3:54:01 PM This report has been signed electronically.

## 2020-09-05 NOTE — Progress Notes (Signed)
To PACU, VSS. Report to [email or web address] 

## 2020-09-05 NOTE — Progress Notes (Signed)
Called to room to assist during endoscopic procedure.  Patient ID and intended procedure confirmed with present staff. Received instructions for my participation in the procedure from the performing physician.  

## 2020-09-05 NOTE — Patient Instructions (Signed)
YOU HAD AN ENDOSCOPIC PROCEDURE TODAY AT THE [facility]:   Refer to the procedure report that was given to you for any specific questions about what was found during the examination.  If the procedure report does not answer your questions, please call your gastroenterologist to clarify.  If you requested that your care partner not be given the details of your procedure findings, then the procedure report has been included in a sealed envelope for you to review at your convenience later.  YOU SHOULD EXPECT: Some feelings of bloating in the abdomen. Passage of more gas than usual.  Walking can help get rid of the air that was put into your GI tract during the procedure and reduce the bloating. If you had a lower endoscopy (such as a colonoscopy or flexible sigmoidoscopy) you may notice spotting of blood in your stool or on the toilet paper. If you underwent a bowel prep for your procedure, you may not have a normal bowel movement for a few days.  Please Note:  You might notice some irritation and congestion in your nose or some drainage.  This is from the oxygen used during your procedure.  There is no need for concern and it should clear up in a day or so.  SYMPTOMS TO REPORT IMMEDIATELY:  Following lower endoscopy (colonoscopy or flexible sigmoidoscopy):  Excessive amounts of blood in the stool  Significant tenderness or worsening of abdominal pains  Swelling of the abdomen that is new, acute  Fever of 100F or higher   For urgent or emergent issues, a gastroenterologist can be reached at any hour by calling [phone]. Do not use MyChart messaging for urgent concerns.    DIET:  We do recommend a small meal at first, but then you may proceed to your regular diet.  Drink plenty of fluids but you should avoid alcoholic beverages for 24 hours.  MEDICATIONS:  Continue present medications.  Please see handouts given to you by your recovery nurse.  Thank you for allowing us to  provide for your healthcare needs today.  ACTIVITY:  You should plan to take it easy for the rest of today and you should NOT DRIVE or use heavy machinery until tomorrow (because of the sedation medicines used during the test).    FOLLOW UP: Our staff will call the number listed on your records 48-72 hours following your procedure to check on you and address any questions or concerns that you may have regarding the information given to you following your procedure. If we do not reach you, we will leave a message.  We will attempt to reach you two times.  During this call, we will ask if you have developed any symptoms of COVID 19. If you develop any symptoms (ie: fever, flu-like symptoms, shortness of breath, cough etc.) before then, please call [phone].  If you test positive for Covid 19 in the 2 weeks post procedure, please call and report this information to us.    If any biopsies were taken you will be contacted by phone or by letter within the next 1-3 weeks.  Please call us at [phone] if you have not heard about the biopsies in 3 weeks.    SIGNATURES/CONFIDENTIALITY: You and/or your care partner have signed paperwork which will be entered into your electronic medical record.  These signatures attest to the fact that that the information above on your After Visit Summary has been reviewed and is understood.  Full responsibility of the   confidentiality of this discharge information lies with you and/or your care-partner.  

## 2020-09-05 NOTE — Progress Notes (Signed)
Medical history reviewed with no changes noted. VS assessed by C.W 

## 2020-09-09 ENCOUNTER — Telehealth: Payer: Self-pay

## 2020-09-09 NOTE — Telephone Encounter (Signed)
  Follow up Call-  Call back number 09/05/2020  Post procedure Call Back phone  # [phone]  Permission to leave phone message Yes  Some recent data might be hidden     Patient questions:  Do you have a fever, pain , or abdominal swelling? No. Pain Score  0 *  Have you tolerated food without any problems? Yes.    Have you been able to return to your normal activities? Yes.    Do you have any questions about your discharge instructions: Diet   No. Medications  No. Follow up visit  No.  Do you have questions or concerns about your Care? No.  Actions: * If pain score is 4 or above: No action needed, pain <4.

## 2020-09-09 NOTE — Telephone Encounter (Signed)
  Follow up Call-  Call back number 09/05/2020  Post procedure Call Back phone  # [phone]  Permission to leave phone message Yes  Some recent data might be hidden    1st follow up call made.  NALM

## 2020-09-10 ENCOUNTER — Encounter: Payer: Self-pay | Admitting: Gastroenterology

## 2020-09-16 ENCOUNTER — Telehealth: Payer: Self-pay

## 2020-09-16 NOTE — Telephone Encounter (Signed)
LVM for pt to call me back to schedule sleep study  

## 2020-10-07 ENCOUNTER — Ambulatory Visit (INDEPENDENT_AMBULATORY_CARE_PROVIDER_SITE_OTHER): Payer: 59 | Admitting: Neurology

## 2020-10-07 DIAGNOSIS — R0683 Snoring: Secondary | ICD-10-CM

## 2020-10-07 DIAGNOSIS — G4733 Obstructive sleep apnea (adult) (pediatric): Secondary | ICD-10-CM

## 2020-10-07 DIAGNOSIS — R0602 Shortness of breath: Secondary | ICD-10-CM

## 2020-10-07 DIAGNOSIS — G4719 Other hypersomnia: Secondary | ICD-10-CM

## 2020-10-07 DIAGNOSIS — F17200 Nicotine dependence, unspecified, uncomplicated: Secondary | ICD-10-CM

## 2020-10-07 DIAGNOSIS — R0601 Orthopnea: Secondary | ICD-10-CM

## 2020-10-14 NOTE — Progress Notes (Signed)
[facility] TEST REPORT ( by Watch PAT)   STUDY DATE:  8-23 0222 DOB:  01/21/1956 MRN: [id number]   ORDERING CLINICIAN: Larey Seat, MD  REFERRING CLINICIAN: Madalyn Rob, MD    CLINICAL INFORMATION/HISTORY: see 08-29-2020 for consultation: Chief concern according to patient : sleepiness and tiredness , ended up hospitalized with pneumonia several times and was finally diagnosed with rheumatoid arthritis. 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   Melvenia Beam has a past medical history of Anxiety, rheumatoid Arthritis, RF positive, ANA negative , GERD (gastroesophageal reflux disease), and Migraine. Sleep relevant medical history: sleepwalker in childhood age 16- ongoing to 37, concussion at age 9,TBI,  cervical spine DDD.   Epworth sleepiness score:15 /24.  BMI: 30 kg/m  Neck Circumference: 15"   FINDINGS:   Sleep Summary:   Total Recording Time (hours, min):   9 h and 20 m   Total Sleep Time (hours, min): 5 h and 50 m              Percent REM (%):  9.4/h                                      Respiratory Indices:   Calculated pAHI (per hour):  26.5/h                           REM pAHI: 29.2/h                                                NREM pAHI: 26.3/h                             Supine AHI:   19.3/h and non supine 26.9/h                                                 Oxygen Saturation Statistics        O2 Saturation Range (%): between 83 and 99 bpm, mean O2 sat. was 92%.                                       O2 Saturation (minutes) <89%: 12.6 minutes , or 3.6% of the sleep time.           Pulse Rate Statistics               Pulse Range:   46  through 103 bpm and mean HR was 72 bpm.               IMPRESSION:  This patient with excessive daytime sleepiness and lung disease, is a smoker and SOB while speaking.   This HST confirms the presence of moderate severe sleep apnea , OSA,  with a reverse correlation to sleep position. Worse apnea frequency in prone sleep position. There was a low sleep efficiency overall. Surprisingly no hypoxia of clinical  significance was noted.    RECOMMENDATION: Auto-titration device for CPAP with a pressure range from 6-17 cm water, 3 cm EPR and heated humidification, mask of patient's choice.     INTERPRETING PHYSICIAN: Larey Seat, MD   Medical Director of [facility] at [facility].

## 2020-10-15 ENCOUNTER — Ambulatory Visit
Admission: RE | Admit: 2020-10-15 | Discharge: 2020-10-15 | Disposition: A | Discharge status: Home / Self Care | Payer: 59 | Source: Ambulatory Visit | Attending: Student in an Organized Health Care Education/Training Program | Admitting: Student in an Organized Health Care Education/Training Program

## 2020-10-15 ENCOUNTER — Other Ambulatory Visit: Payer: Self-pay

## 2020-10-15 DIAGNOSIS — Z1231 Encounter for screening mammogram for malignant neoplasm of breast: Secondary | ICD-10-CM

## 2020-10-22 NOTE — Addendum Note (Signed)
Addended by: Larey Seat on: 10/22/2020 06:57 PM   Modules accepted: Orders

## 2020-10-22 NOTE — Progress Notes (Signed)
IMPRESSION:  This patient with excessive daytime sleepiness and lung disease, is a smoker and SOB while speaking.   This HST confirms the presence of moderate severe sleep apnea , OSA, with a reverse correlation to sleep position. Worse apnea frequency in prone sleep position. There was a low sleep efficiency overall. Surprisingly no hypoxia of clinical significance was noted.   RECOMMENDATION: Auto-titration device for CPAP with a pressure range from 6-17 cm water, 3 cm EPR and heated humidification, mask of patient's choice.    INTERPRETING PHYSICIAN: Larey Seat, MD   Medical Director of [facility] at [facility].

## 2020-10-22 NOTE — Procedures (Signed)
[facility] TEST REPORT ( by Watch PAT)   STUDY DATE:  8-23 0222 DOB:  08/08/1955 MRN: [id number]   ORDERING CLINICIAN: Larey Seat, MD  REFERRING CLINICIAN: Madalyn Rob, MD    CLINICAL INFORMATION/HISTORY: see 08-29-2020 for consultation: Chief concern according to patient : sleepiness and tiredness , ended up hospitalized with pneumonia several times and was finally diagnosed with rheumatoid arthritis. 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   Melvenia Beam has a past medical history of Anxiety, rheumatoid Arthritis, RF positive, ANA negative , GERD (gastroesophageal reflux disease), and Migraine. Sleep relevant medical history: sleepwalker in childhood age 36- ongoing to 74, concussion at age 45,TBI,  cervical spine DDD.   Epworth sleepiness score:15 /24.  BMI: 30 kg/m  Neck Circumference: 15"   FINDINGS:   Sleep Summary:   Total Recording Time (hours, min):   9 h and 20 m   Total Sleep Time (hours, min): 5 h and 50 m              Percent REM (%):  9.4/h                                      Respiratory Indices:   Calculated pAHI (per hour):  26.5/h                           REM pAHI: 29.2/h                                                NREM pAHI: 26.3/h                             Supine AHI:   19.3/h and non supine 26.9/h                                                 Oxygen Saturation Statistics        O2 Saturation Range (%): between 83 and 99 bpm, mean O2 sat. was 92%.                                       O2 Saturation (minutes) <89%: 12.6 minutes , or 3.6% of the sleep time.           Pulse Rate Statistics               Pulse Range:   46  through 103 bpm and mean HR was 72 bpm.               IMPRESSION:  This patient with excessive daytime sleepiness and lung disease, is a smoker and SOB while speaking.   This HST confirms the presence of moderate severe sleep apnea , OSA, with a  reverse correlation to sleep position. Worse apnea frequency in prone sleep position. There was a low sleep efficiency overall. Surprisingly no hypoxia of clinical significance was noted.  RECOMMENDATION: Auto-titration device for CPAP with a pressure range from 6-17 cm water, 3 cm EPR and heated humidification, mask of patient's choice.     INTERPRETING PHYSICIAN: Larey Seat, MD   Medical Director of [facility] at [facility].

## 2020-10-23 ENCOUNTER — Telehealth: Payer: Self-pay | Admitting: *Deleted

## 2020-10-23 ENCOUNTER — Encounter: Payer: Self-pay | Admitting: *Deleted

## 2020-10-23 NOTE — Telephone Encounter (Signed)
-----   Message from Larey Seat, MD sent at 10/22/2020  6:57 PM EDT ----- IMPRESSION:  This patient with excessive daytime sleepiness and lung disease, is a smoker and SOB while speaking.   This HST confirms the presence of moderate severe sleep apnea , OSA, with a reverse correlation to sleep position. Worse apnea frequency in prone sleep position. There was a low sleep efficiency overall. Surprisingly no hypoxia of clinical significance was noted.   RECOMMENDATION: Auto-titration device for CPAP with a pressure range from 6-17 cm water, 3 cm EPR and heated humidification, mask of patient's choice.    INTERPRETING PHYSICIAN: Larey Seat, MD   Medical Director of [facility] at [facility].

## 2020-10-23 NOTE — Telephone Encounter (Signed)
I called pt. I advised pt that Dr. Brett Fairy reviewed their sleep study results and found that pt has moderate-severe sleep apnea. Dr. Brett Fairy recommends that pt start on CPAP. I reviewed PAP compliance expectations with the pt. Pt is agreeable to starting a CPAP. I advised pt that an order will be sent to a DME, [employer], and [employer] will call the pt within about one week after they file with the pt's insurance. [employer] will show the pt how to use the machine, fit for masks, and troubleshoot the CPAP if needed. A follow up appt was made for insurance purposes with Dr. Brett Fairy on 01/23/2021 at 8:30am. Pt verbalized understanding to arrive 15 minutes early and bring their CPAP. A letter with all of this information in it will be mailed to the pt as a reminder. I verified with the pt that the address we have on file is correct. Pt verbalized understanding of results. Pt had no questions at this time but was encouraged to call back if questions arise. I have sent the order to [employer] and have received confirmation that they have received the order.

## 2020-11-18 ENCOUNTER — Other Ambulatory Visit: Payer: Self-pay

## 2020-11-18 ENCOUNTER — Encounter: Payer: Self-pay | Admitting: Student

## 2020-11-18 ENCOUNTER — Ambulatory Visit (INDEPENDENT_AMBULATORY_CARE_PROVIDER_SITE_OTHER): Payer: 59 | Admitting: Student

## 2020-11-18 VITALS — BP 132/78 | HR 77 | Temp 98.1°F | Ht 65.0 in | Wt 190.7 lb

## 2020-11-18 DIAGNOSIS — Z23 Encounter for immunization: Secondary | ICD-10-CM | POA: Diagnosis not present

## 2020-11-18 DIAGNOSIS — E785 Hyperlipidemia, unspecified: Secondary | ICD-10-CM | POA: Diagnosis not present

## 2020-11-18 DIAGNOSIS — R7303 Prediabetes: Secondary | ICD-10-CM

## 2020-11-18 DIAGNOSIS — G4733 Obstructive sleep apnea (adult) (pediatric): Secondary | ICD-10-CM

## 2020-11-18 DIAGNOSIS — F17200 Nicotine dependence, unspecified, uncomplicated: Secondary | ICD-10-CM | POA: Diagnosis not present

## 2020-11-18 DIAGNOSIS — M0579 Rheumatoid arthritis with rheumatoid factor of multiple sites without organ or systems involvement: Secondary | ICD-10-CM

## 2020-11-18 DIAGNOSIS — M1990 Unspecified osteoarthritis, unspecified site: Secondary | ICD-10-CM | POA: Insufficient documentation

## 2020-11-18 DIAGNOSIS — M4720 Other spondylosis with radiculopathy, site unspecified: Secondary | ICD-10-CM | POA: Diagnosis not present

## 2020-11-18 DIAGNOSIS — M4725 Other spondylosis with radiculopathy, thoracolumbar region: Secondary | ICD-10-CM

## 2020-11-18 MED ORDER — VARENICLINE TARTRATE 0.5 MG X 11 & 1 MG X 42 PO MISC: ORAL | 0 refills | Status: DC

## 2020-11-18 NOTE — Patient Instructions (Signed)
Heidi Chapman,  It was a pleasure seeing you in the clinic today.   We discussed the following today:  I have referred you to Physical Medicine and Rehabilitation. They should be able to help you regain more function in your daily life by better controlling your symptoms from osteoarthritis and RA. I have prescribed chantix and sent it to your preferred pharmacy. It is very important that you quit smoking and I am glad to see that you are interested in stopping. You received your flu shot today. I will fill out your disability paperwork and have it sent over.  Please call our clinic at [phone] if you have any questions or concerns. The best time to call is Monday-Friday from 9am-4pm, but there is someone available 24/7 at the same number. If you need medication refills, please notify your pharmacy one week in advance and they will send [place] a request.   Thank you for letting us take part in your care. We look forward to seeing you next time!

## 2020-11-18 NOTE — Assessment & Plan Note (Signed)
Lipid profile with LDL 123, total cholesterol 211, HDL 66. She does have newly diagnosed prediabetes and extensive smoking history, although interested in quitting. ASCVD risk of 8.7%, which decreases to 4.5% with smoking cessation. She is interested in tobacco cessation with emphasis on healthier diet and exercise (as tolerated with history of RA and osteoarthritis) and would like to avoid initiation of statin therapy at this time.   Discussed that statin therapy is strongly encouraged should her LDL not improve with lifestyle management. She confirms understanding.  Plan: -smoking cessation with chantix -lifestyle management -f/u in 3 months -strongly consider statin therapy should LDL not improve

## 2020-11-18 NOTE — Progress Notes (Signed)
   CC: f/u visit for osteoarthritis  HPI:  Heidi Chapman is a 65 y.o. female with history listed below presenting to the [facility] for f/u visit for osteoarthritis. Please see individualized problem based charting for full HPI.  Past Medical History:  Diagnosis Date   Anxiety    Arthritis    Fibromyalgia    GERD (gastroesophageal reflux disease)    Migraine     Review of Systems:  Negative aside from that listed in individualized problem based charting.  Physical Exam:  Vitals:   11/18/20 0848  BP: 132/78  Pulse: 77  Temp: 98.1 F (36.7 C)  TempSrc: Oral  SpO2: 97%  Weight: 190 lb 11.2 oz (86.5 kg)  Height: 5\' 5"  (1.651 m)   Physical Exam Constitutional:      Appearance: She is obese. She is not ill-appearing.  HENT:     Mouth/Throat:     Mouth: Mucous membranes are moist.     Pharynx: Oropharynx is clear. No oropharyngeal exudate.  Eyes:     Extraocular Movements: Extraocular movements intact.     Conjunctiva/sclera: Conjunctivae normal.     Pupils: Pupils are equal, round, and reactive to light.  Cardiovascular:     Rate and Rhythm: Normal rate and regular rhythm.     Pulses: Normal pulses.     Heart sounds: Normal heart sounds. No murmur heard.   No friction rub. No gallop.  Pulmonary:     Effort: Pulmonary effort is normal.     Breath sounds: Normal breath sounds. No wheezing, rhonchi or rales.  Abdominal:     General: Bowel sounds are normal. There is no distension.     Palpations: Abdomen is soft.     Tenderness: There is no abdominal tenderness.  Musculoskeletal:     Cervical back: Normal range of motion. No rigidity.     Comments: Spinal tenderness along thoracolumbar spine. ROM limited with spinal flexion/extension and shoulder abduction.  Skin:    General: Skin is warm and dry.  Neurological:     Mental Status: She is alert and oriented to person, place, and time.  Psychiatric:        Mood and Affect: Mood normal.        Behavior: Behavior  normal.     Assessment & Plan:   See Encounters Tab for problem based charting.  Patient discussed with Dr. Daryll Drown

## 2020-11-18 NOTE — Assessment & Plan Note (Signed)
Patient with history of RA, followed by Dr. Trudie Reed (rheumatology). Recently obtained a lumbar spine x-ray showing multilevel disc space narrowing and bone spurs consistent with OA. Dr. Trudie Reed recommended naproxen daily and referral to orthopedics for further management of symptoms. Discussed with patient about referral to PM&R to help with regaining functionality and improving symptoms. She confirms understanding.   Plan: -referral to PM&R -continue naproxen daily -f/u in 3 months

## 2020-11-18 NOTE — Assessment & Plan Note (Signed)
Was unable to obtain chantix after last visit, was told by pharmacist that chantix was on recall(?). Discussed importance of smoking cessation and she is interested in quitting, especially given prediabetes and HLD. Chantix has worked for her well in the past and she would like to try it again.  Plan: -reordered chantix -f/u on smoking cessation at next visit

## 2020-11-18 NOTE — Assessment & Plan Note (Signed)
Recent sleep study showing OSA. She has been prescribed a CPAP, which she states will be delivered by November. Discussed importance of adhering to CPAP, which she understands.  Plan: -CPAP nightly once delivered

## 2020-11-18 NOTE — Assessment & Plan Note (Signed)
A1c at 5.7%, consistent with prediabetes. Discussed lifestyle modifications with healthier diet and exercise. She confirms understanding.   Plan: -annual A1c check -lifestyle modifications

## 2020-11-20 NOTE — Progress Notes (Signed)
Internal Medicine Clinic Attending  Case discussed with Dr. Jinwala  At the time of the visit.  We reviewed the resident's history and exam and pertinent patient test results.  I agree with the assessment, diagnosis, and plan of care documented in the resident's note.  

## 2020-12-04 ENCOUNTER — Encounter: Payer: Self-pay | Admitting: Physical Medicine and Rehabilitation

## 2020-12-12 ENCOUNTER — Telehealth: Payer: Self-pay

## 2020-12-12 NOTE — Telephone Encounter (Signed)
Fax from [facility] requesting a refill on Chantix.  T"he following medication records are no longer available for ordering. Place a new order with a different medication record. varenicline (CHANTIX PAK) 0.5 MG X 11 & 1 MG X 42 tablet".

## 2020-12-18 ENCOUNTER — Telehealth: Payer: Self-pay

## 2020-12-18 DIAGNOSIS — F17200 Nicotine dependence, unspecified, uncomplicated: Secondary | ICD-10-CM

## 2020-12-25 MED ORDER — VARENICLINE TARTRATE 1 MG PO TABS: 1.0000 mg | ORAL_TABLET | Freq: Two times a day (BID) | ORAL | 1 refills | Status: DC

## 2020-12-25 NOTE — Telephone Encounter (Signed)
Patient walked in for refill on Chantix. First request was 10/27, second request sent on 11/2.

## 2021-01-23 ENCOUNTER — Ambulatory Visit: Payer: 59 | Admitting: Neurology

## 2021-01-28 ENCOUNTER — Telehealth: Payer: Self-pay | Admitting: Internal Medicine

## 2021-01-28 DIAGNOSIS — M0579 Rheumatoid arthritis with rheumatoid factor of multiple sites without organ or systems involvement: Secondary | ICD-10-CM

## 2021-01-28 NOTE — Telephone Encounter (Signed)
Pt requesting a new RHEUMATOLOGIST due to starting in January [facility]  (Dr Gavin Pound) will not longer be accepting her new ins ( [employer]).    Please advise if a New Referral can be placed or if the patient needs to be seen in the [facility] first.

## 2021-02-24 ENCOUNTER — Ambulatory Visit: Payer: 59 | Admitting: Physical Medicine and Rehabilitation

## 2021-03-10 ENCOUNTER — Ambulatory Visit (HOSPITAL_COMMUNITY)
Admission: EM | Admit: 2021-03-10 | Discharge: 2021-03-10 | Disposition: A | Discharge status: Home / Self Care | Payer: 59 | Attending: Family Medicine | Admitting: Family Medicine

## 2021-03-10 ENCOUNTER — Other Ambulatory Visit: Payer: Self-pay

## 2021-03-10 ENCOUNTER — Encounter (HOSPITAL_COMMUNITY): Payer: Self-pay | Admitting: *Deleted

## 2021-03-10 DIAGNOSIS — R590 Localized enlarged lymph nodes: Secondary | ICD-10-CM | POA: Insufficient documentation

## 2021-03-10 DIAGNOSIS — R509 Fever, unspecified: Secondary | ICD-10-CM | POA: Insufficient documentation

## 2021-03-10 LAB — CBC WITH DIFFERENTIAL/PLATELET
Abs Immature Granulocytes: 0.07 10*3/uL (ref 0.00–0.07)
Basophils Absolute: 0.1 10*3/uL (ref 0.0–0.1)
Basophils Relative: 1 %
Eosinophils Absolute: 0.1 10*3/uL (ref 0.0–0.5)
Eosinophils Relative: 1 %
HCT: 41.4 % (ref 36.0–46.0)
Hemoglobin: 14.6 g/dL (ref 12.0–15.0)
Immature Granulocytes: 1 %
Lymphocytes Relative: 25 %
Lymphs Abs: 1.8 10*3/uL (ref 0.7–4.0)
MCH: 32.5 pg (ref 26.0–34.0)
MCHC: 35.3 g/dL (ref 30.0–36.0)
MCV: 92.2 fL (ref 80.0–100.0)
Monocytes Absolute: 0.7 10*3/uL (ref 0.1–1.0)
Monocytes Relative: 10 %
Neutro Abs: 4.5 10*3/uL (ref 1.7–7.7)
Neutrophils Relative %: 62 %
Platelets: 241 10*3/uL (ref 150–400)
RBC: 4.49 MIL/uL (ref 3.87–5.11)
RDW: 12.3 % (ref 11.5–15.5)
WBC: 7.3 10*3/uL (ref 4.0–10.5)
nRBC: 0 % (ref 0.0–0.2)

## 2021-03-10 LAB — COMPREHENSIVE METABOLIC PANEL
ALT: 56 U/L — ABNORMAL HIGH (ref 0–44)
AST: 43 U/L — ABNORMAL HIGH (ref 15–41)
Albumin: 3.6 g/dL (ref 3.5–5.0)
Alkaline Phosphatase: 149 U/L — ABNORMAL HIGH (ref 38–126)
Anion gap: 9 (ref 5–15)
BUN: 9 mg/dL (ref 8–23)
CO2: 24 mmol/L (ref 22–32)
Calcium: 9.4 mg/dL (ref 8.9–10.3)
Chloride: 106 mmol/L (ref 98–111)
Creatinine, Ser: 0.87 mg/dL (ref 0.44–1.00)
GFR, Estimated: >60 mL/min (ref >60)
Glucose, Bld: 116 mg/dL — ABNORMAL HIGH (ref 70–99)
Potassium: 3.8 mmol/L (ref 3.5–5.1)
Sodium: 139 mmol/L (ref 135–145)
Total Bilirubin: 1.1 mg/dL (ref 0.3–1.2)
Total Protein: 7 g/dL (ref 6.5–8.1)

## 2021-03-10 LAB — SEDIMENTATION RATE: Sed Rate: 26 mm/hr — ABNORMAL HIGH (ref 0–22)

## 2021-03-10 MED ORDER — CEPHALEXIN 500 MG PO CAPS: 500.0000 mg | ORAL_CAPSULE | Freq: Three times a day (TID) | ORAL | 0 refills | Status: AC

## 2021-03-10 NOTE — ED Triage Notes (Signed)
PT reports swelling of lymph nodes at base of skull. Pt reports she also has RA and it could be the cause. SX's started one week ago.

## 2021-03-10 NOTE — ED Provider Notes (Signed)
[place]    CSN: [id number] Arrival date & time: 03/10/21  1120      History   Chief Complaint Chief Complaint  Patient presents with   Lymphadenopathy    HPI Heidi Chapman is a 66 y.o. female.   Patient is here for 1 week of swelling of her lymph nodes in the back of her neck.  They are getting worse.  More swollen, tender.  No other enlarged Lns.   She has noted off/on low grade fever, no higher than 100.  No uri symptoms.  No recent injury to the hair/scalp.  No ingrown hairs.  No recent hair coloring, etc.  She does have RA and wonders if this is contributing.    Past Medical History:  Diagnosis Date   Anxiety    Arthritis    Fibromyalgia    GERD (gastroesophageal reflux disease)    Migraine    Polyarthralgia 01/26/2019   Screening for colon cancer 06/28/2020   Snoring 08/29/2020    Patient Active Problem List   Diagnosis Date Noted   Osteoarthritis 11/18/2020   Orthopnea 08/29/2020   Shortness of breath at rest 08/29/2020   Obstructive sleep apnea 08/29/2020   Fatigue 06/28/2020   Shortness of breath 06/28/2020   Screening mammogram for breast cancer 06/28/2020   Hyperlipidemia 06/28/2020   Prediabetes 06/28/2020   Tobacco use disorder 06/28/2020   Sinusitis, acute, maxillary 11/15/2019   CAP (community acquired pneumonia) 11/01/2019   Rheumatoid arthritis involving multiple sites with positive rheumatoid factor ([place]) 03/13/2019   Acute left-sided low back pain without sciatica 03/13/2019   Anxiety 01/26/2019   Healthcare maintenance 12/06/2017   Acalculous cholecystitis 10/23/2017    Past Surgical History:  Procedure Laterality Date   CESAREAN SECTION     CHOLECYSTECTOMY N/A 10/24/2017   Procedure: LAPAROSCOPIC CHOLECYSTECTOMY WITH INTRAOPERATIVE CHOLANGIOGRAM;  Surgeon: Erroll Luna, MD;  Location: [place];  Service: General;  Laterality: N/A;   TUBAL LIGATION      OB History   No obstetric history on file.      Home  Medications    Prior to Admission medications   Medication Sig Start Date End Date Taking? Authorizing Provider  cimetidine (TAGAMET) 200 MG tablet Take 200 mg by mouth as needed.    [provider]  cyclobenzaprine (FLEXERIL) 10 MG tablet 1 tablet at bedtime as needed 03/27/19   [provider]  Dietary Management Product (RHEUMATE) CAPS Take 1 capsule by mouth daily.    [provider]  fluticasone (FLONASE) 50 MCG/ACT nasal spray Place 2 sprays into both nostrils daily. Patient not taking: Reported on 09/05/2020 02/11/18   Rodell Perna A, PA-C  folic acid (FOLVITE) 1 MG tablet 1 tablet    [provider]  golimumab (SIMPONI ARIA) 50 MG/4ML SOLN injection 2mg /kg    [provider]  Methotrexate 25 MG/ML SOSY Inject 0.8 mLs into the skin once a week.    [provider]  Multiple Vitamins-Minerals (MULTIVITAMIN WOMEN 50+) TABS See admin instructions. 03/27/19   [provider]  varenicline (CHANTIX CONTINUING MONTH PAK) 1 MG tablet Take 1 tablet (1 mg total) by mouth 2 (two) times daily. 12/25/20   Aldine Contes, MD    Family History Family History  Problem Relation Age of Onset   Cancer - Lung Mother    Brain cancer Mother    Colon polyps Father    Prostate cancer Father    Diverticulitis Father    Heart attack Father  Cancer Maternal Grandmother    Cancer Maternal Grandfather    Cancer Paternal Grandfather     Social History Social History   Tobacco Use   Smoking status: Every Day    Packs/day: 0.50    Types: Cigarettes   Smokeless tobacco: Never   Tobacco comments:    1/2 pk a day.  Wants Chantex  Substance Use Topics   Alcohol use: Yes    Comment: 1-3 drinks annually   Drug use: Never     Allergies   Leflunomide, Peanut butter flavor, and Latex   Review of Systems Review of Systems  Constitutional:  Negative for appetite change, fatigue and fever.  HENT:  Negative for congestion, ear pain,  rhinorrhea and sore throat.   Eyes: Negative.   Respiratory: Negative.    Cardiovascular: Negative.   Musculoskeletal:  Positive for arthralgias.    Physical Exam Triage Vital Signs ED Triage Vitals  Enc Vitals Group     BP 03/10/21 1135 120/82     Pulse Rate 03/10/21 1135 93     Resp 03/10/21 1135 18     Temp 03/10/21 1135 99 F (37.2 C)     Temp src --      SpO2 03/10/21 1135 94 %     Weight --      Height --      Head Circumference --      Peak Flow --      Pain Score 03/10/21 1132 6     Pain Loc --      Pain Edu? --      Excl. in [place]? --    No data found.  Updated Vital Signs BP 120/82    Pulse 93    Temp 99 F (37.2 C)    Resp 18    SpO2 94%   Visual Acuity Right Eye Distance:   Left Eye Distance:   Bilateral Distance:    Right Eye Near:   Left Eye Near:    Bilateral Near:     Physical Exam Constitutional:      Appearance: Normal appearance.  HENT:     Head: Normocephalic and atraumatic.     Nose: Nose normal.  Cardiovascular:     Rate and Rhythm: Normal rate and regular rhythm.  Pulmonary:     Effort: Pulmonary effort is normal.     Breath sounds: Normal breath sounds.  Musculoskeletal:     Cervical back: Normal range of motion and neck supple.  Lymphadenopathy:     Head:     Right side of head: Occipital adenopathy present.     Left side of head: Occipital adenopathy present.     Cervical: No cervical adenopathy.     Upper Body:     Right upper body: No supraclavicular or axillary adenopathy.     Left upper body: No supraclavicular or axillary adenopathy.     Lower Body: No right inguinal adenopathy. No left inguinal adenopathy.     Comments: On the right posterior scalp are two large Lns, tender;  there is another on the left;   Skin:    Comments: No obvious sores, lesions, or areas of skin irritation at the scalp or head  Neurological:     Mental Status: She is alert.     UC Treatments / Results  Labs (all labs ordered are listed, but  only abnormal results are displayed) Labs Reviewed  CBC WITH DIFFERENTIAL/PLATELET  COMPREHENSIVE METABOLIC PANEL  SEDIMENTATION RATE    EKG  Radiology No results found.  Procedures Procedures (including critical care time)  Medications Ordered in UC Medications - No data to display  Initial Impression / Assessment and Plan / UC Course  I have reviewed the triage vital signs and the nursing notes.  Pertinent labs & imaging results that were available during my care of the patient were reviewed by me and considered in my medical decision making (see chart for details).   Patient seen today for several enlarged occipital lymph nodes.  Unclear etiology at this time.  No obvious source of infection noted.  No other enlarged Lns noted.   Will start with blood work today, and treat with keflex for presumed infection of unknown source;  recommend she follow up with her pcp, which she is aware.   Final Clinical Impressions(s) / UC Diagnoses   Final diagnoses:  Occipital lymphadenopathy  Elevated temperature     Discharge Instructions      You were seen today for swelling of the occipital lymph nodes.  I do not see a obvious source of infection.  I have ordered routine blood work today, and have started keflex three times/day x 10 days.  Please follow up with your primary care provider for further discussion, especially if the lymph nodes to not improve.      ED Prescriptions     Medication Sig Dispense Auth. Provider   cephALEXin (KEFLEX) 500 MG capsule Take 1 capsule (500 mg total) by mouth 3 (three) times daily for 10 days. 30 capsule Rondel Oh, MD      PDMP not reviewed this encounter.   Rondel Oh, MD 03/10/21 1229

## 2021-03-10 NOTE — Discharge Instructions (Addendum)
You were seen today for swelling of the occipital lymph nodes.  I do not see a obvious source of infection.  I have ordered routine blood work today, and have started keflex three times/day x 10 days.  Please follow up with your primary care provider for further discussion, especially if the lymph nodes to not improve.

## 2021-03-11 ENCOUNTER — Encounter: Payer: Self-pay | Admitting: Neurology

## 2021-03-12 NOTE — Progress Notes (Signed)
Office Visit Note  Patient: Heidi Chapman             Date of Birth: 07-24-1955           MRN: [id number]             PCP: Farrel Gordon, DO Referring: Farrel Gordon, DO Visit Date: 03/13/2021   Subjective:  changing RA MD due to insurance (Pain in spine from neck down. Pain for 3-4 years. Not currently seeing a spine MD. No hx of spine sx)   History of Present Illness: Heidi Chapman is a 66 y.o. female here for seropositive rheumatoid arthritis which has been treated with methotrexate 17.5 mg [place] weekly and simponi aria infusion and taking folic acid 3 mg daily. In addition to RA she suffers pain with OA including lumbar spine DJD and fibromyalgia pain treated with naproxen 500 mg PRN and flexeril.  She reports joint pain started since many years ago particularly with progressive pain and stiffness and then lateral deviation worsening in MCP joints of both hands.  She also had chronic osteoarthritis particularly in her back with sciatica and back pain at multiple levels.  She finally went for evaluation of the hand pain and deformities since about 3 years ago was diagnosed with seropositive rheumatoid arthritis.  Initial treatments tried include leflunomide which was not tolerated and Enbrel which did not have clinical response.  Oral methotrexate was not tolerable she had some degree of nausea with the subcutaneous methotrexate as well but not as bad.  While on treatment she felt disease control was very good mostly just had the chronic OA type symptoms not much swelling and no progression of deformities. She has been a patient with Dr. Trudie Reed needing to transfer care due to change in insurance status.  She is now off all of her medications since about a month ago and is noticing worsening joint pains again.  Particularly since 2 weeks ago she has much worse neck pain is developed lymph node swelling on the back of her skull and neck.  She went to the emergency department for this were no specific  problems were found liver function test showed a mild elevation she was prescribed Keflex empirically for possible infection has not noticed any difference since taking this medicine. Besides joint pain and lymph node swelling she feels extremely fatigued overall.  DMARD Hx Enbrel primary nonresponder Simponi aria TOC Methotrexate [place] TOC Methotrexate PO GI intolerance Leflunomide not tolerated  Labs reviewed 02/2021 ESR 26 CBC wnl CMP AST 43 ALT 56 Alk phos 149  03/2019 TB neg HBV neg HCV neg  01/2019 RA 109 CCP >250  10/2017 HIV neg  Review of Systems  Constitutional:  Positive for fatigue. Negative for fever and night sweats.  HENT:  Positive for mouth dryness. Negative for mouth sores and sore throat.   Eyes:  Positive for dryness.  Respiratory:  Negative for shortness of breath.   Cardiovascular:  Negative for swelling in legs/feet.  Musculoskeletal:  Positive for joint pain, joint pain and joint swelling.  Skin:  Positive for nodules/bumps. Negative for rash.  Neurological:  Negative for numbness and night sweats.   PMFS History:  Patient Active Problem List   Diagnosis Date Noted   High risk medication use 03/13/2021   Neck pain 03/13/2021   Overweight 03/13/2021   Body mass index (BMI) 30.0-30.9, adult 03/13/2021   Acquired deformity of right hand 03/13/2021   Lymphadenopathy 03/13/2021   Osteoarthritis 11/18/2020  Orthopnea 08/29/2020   Shortness of breath at rest 08/29/2020   Obstructive sleep apnea 08/29/2020   Fatigue 06/28/2020   Shortness of breath 06/28/2020   Screening mammogram for breast cancer 06/28/2020   Hyperlipidemia 06/28/2020   Prediabetes 06/28/2020   Tobacco use disorder 06/28/2020   Sinusitis, acute, maxillary 11/15/2019   CAP (community acquired pneumonia) 11/01/2019   Rheumatoid arthritis involving multiple sites with positive rheumatoid factor ([place]) 03/13/2019   Acute left-sided low back pain without sciatica 03/13/2019   Anxiety  01/26/2019   Healthcare maintenance 12/06/2017   Acalculous cholecystitis 10/23/2017   Benign neoplasm of colon 12/14/2011   Absence of both cervix and uterus, acquired 12/14/2011    Past Medical History:  Diagnosis Date   Anxiety    Arthritis    Fibromyalgia    GERD (gastroesophageal reflux disease)    Migraine    Polyarthralgia 01/26/2019   Screening for colon cancer 06/28/2020   Snoring 08/29/2020    Family History  Problem Relation Age of Onset   Cancer - Lung Mother    Brain cancer Mother    Colon polyps Father    Prostate cancer Father    Diverticulitis Father    Heart attack Father    Cancer Maternal Grandmother    Cancer Maternal Grandfather    Cancer Paternal Grandfather    Past Surgical History:  Procedure Laterality Date   CESAREAN SECTION     CHOLECYSTECTOMY N/A 10/24/2017   Procedure: LAPAROSCOPIC CHOLECYSTECTOMY WITH INTRAOPERATIVE CHOLANGIOGRAM;  Surgeon: Erroll Luna, MD;  Location: [facility];  Service: General;  Laterality: N/A;   GALLBLADDER SURGERY     TUBAL LIGATION     Social History   Social History Narrative   Lives with youngest daughter and granddaughter   Right handed   Caffeine: 1 cup of coffee and 1 soda daily   Immunization History  Administered Date(s) Administered   Influenza,inj,Quad PF,6+ Mos 01/26/2019, 11/18/2020   PFIZER(Purple Top)SARS-COV-2 Vaccination 05/13/2019, 06/07/2019, 10/30/2019     Objective: Vital Signs: BP 111/76    Pulse 93    Ht 5' 5" (1.651 m)    Wt 190 lb 12.8 oz (86.5 kg)    BMI 31.75 kg/m    Physical Exam HENT:     Mouth/Throat:     Mouth: Mucous membranes are moist.     Pharynx: Oropharynx is clear.  Eyes:     Conjunctiva/sclera: Conjunctivae normal.  Neck:     Comments: Palpable, firm, minimally mobile nodes at occiput, behind ears, posterior cervical bilaterally Cardiovascular:     Rate and Rhythm: Normal rate and regular rhythm.  Pulmonary:     Effort: Pulmonary effort is normal.     Breath  sounds: Normal breath sounds.  Musculoskeletal:     Cervical back: Tenderness present.  Lymphadenopathy:     Cervical: Cervical adenopathy present.  Skin:    General: Skin is warm and dry.     Findings: No rash.  Neurological:     Mental Status: She is alert.  Psychiatric:        Mood and Affect: Mood normal.     Musculoskeletal Exam:  Neck ROM restricted with pain, tenderness to palpation at occiput and cervical paraspinal muscles Shoulders full ROM pain going to neck with movement none focal over shoulder joints Elbows full ROM no tenderness or swelling Wrists full ROM no tenderness or swelling Fingers full ROM, lateral deviation of MCPs worse on right almost completely reducible Knees full ROM no tenderness or swelling Ankles full  ROM no tenderness or swelling   CDAI Exam: CDAI Score: 12  Patient Global: 50 mm; Provider Global: 30 mm Swollen: 0 ; Tender: 5  Joint Exam 03/13/2021      Right  Left  MCP 2   Tender     MCP 3   Tender     MCP 4   Tender     MCP 5   Tender     Cervical Spine   Tender        Investigation: No additional findings.  Imaging: XR Cervical Spine 2 or 3 views  Result Date: 03/13/2021 Xray cervical spine 2 views AP and lateral There is no evidence of acute injury or displacement.  There is mild retrolisthesis at C5 and disc space narrowing below this level.  There are anterior osteophytes and some periarticular calcification. No ankylosis or erosive changes visible. Visible portion of upper chest there is possible adenopathy changes also possible abnormal calcification at upper costal cartilage.   Recent Labs: Lab Results  Component Value Date   WBC 7.3 03/10/2021   HGB 14.6 03/10/2021   PLT 241 03/10/2021   NA 139 03/10/2021   K 3.8 03/10/2021   CL 106 03/10/2021   CO2 24 03/10/2021   GLUCOSE 116 (H) 03/10/2021   BUN 9 03/10/2021   CREATININE 0.87 03/10/2021   BILITOT 1.1 03/10/2021   ALKPHOS 149 (H) 03/10/2021   AST 43 (H)  03/10/2021   ALT 56 (H) 03/10/2021   PROT 7.0 03/10/2021   ALBUMIN 3.6 03/10/2021   CALCIUM 9.4 03/10/2021   GFRAA 84 01/26/2019    Speciality Comments: No specialty comments available.  Procedures:  No procedures performed Allergies: Leflunomide, Peanut butter flavor, and Latex   Assessment / Plan:     Visit Diagnoses: Rheumatoid arthritis involving multiple sites with positive rheumatoid factor ([place]) - Plan: XR Cervical Spine 2 or 3 views, DG Chest 2 View, predniSONE (DELTASONE) 10 MG tablet  Seropositive RA of bilateral hands with mild deformities previously was under good control with Simponi Aria and methotrexate.  Currently her worst area is neck pain with stiffness and lymphadenopathy.  I discussed switching to subcutaneous Simponi treatment I believe would be easier for access than resuming infusion treatment.  Currently waiting to recheck metabolic panel that liver function is stable before resuming methotrexate.  Getting baseline labs today.  Recurrent symptoms prescribed a 10-day course of prednisone tapering from 20 mg to see if this significantly improves symptoms.  Fatigue, unspecified type  Not sure whether her fatigue is related to disease activity or other factors.  She is off the methotrexate so do not think this is a medication effect.  High risk medication use - Plan: QuantiFERON-TB Gold Plus, DG Chest 2 View, COMPLETE METABOLIC PANEL WITH GFR  Planning to restart treatment with TNF inhibitor and methotrexate checking QuantiFERON test also getting updated chest x-ray.  Recent metabolic panel was abnormal with LFT elevations this would be concerning for restarting methotrexate recommend she can come for rechecking his lab test in about 2 weeks after taking the initial prednisone taper.  Neck pain Osteoarthritis of spine with radiculopathy, thoracolumbar region  She is scheduled for seeing PM&R clinic for management of chronic degenerative spine disease at multiple  levels.  She is not interested in seeing a surgeon for any kind of operative management.  Lymphadenopathy - Plan: DG Chest 2 View,  ANA, Angiotensin converting enzyme  Lymph node swelling in neck no particular evidence of infection and no response  to keflex. Timing is suspicious for related to RA inflammation starting 2 weeks off her medications.  Also check some baseline screening for sarcoidosis which could be an overlap with the positive rheumatoid factor.  Obtaining chest xray also to evaluate for signs of more extensive adenopathy.  Orders: Orders Placed This Encounter  Procedures   XR Cervical Spine 2 or 3 views   DG Chest 2 View   QuantiFERON-TB Gold Plus   ANA   Angiotensin converting enzyme   COMPLETE METABOLIC PANEL WITH GFR   Meds ordered this encounter  Medications   predniSONE (DELTASONE) 10 MG tablet    Sig: Take 2 tablets (20 mg total) by mouth daily with breakfast for 5 days, THEN 1 tablet (10 mg total) daily with breakfast for 5 days.    Dispense:  15 tablet    Refill:  0     Follow-Up Instructions: Return in about 2 months (around 05/11/2021) for RA Simponi/?MTX restart f/u 29mo.   CCollier Salina MD  Note - This record has been created using [employer]  Chart creation errors have been sought, but may not always  have been located. Such creation errors do not reflect on  the standard of medical care.

## 2021-03-13 ENCOUNTER — Ambulatory Visit: Payer: Self-pay

## 2021-03-13 ENCOUNTER — Other Ambulatory Visit: Payer: Self-pay

## 2021-03-13 ENCOUNTER — Ambulatory Visit: Payer: 59 | Admitting: Internal Medicine

## 2021-03-13 ENCOUNTER — Encounter: Payer: Self-pay | Admitting: Internal Medicine

## 2021-03-13 ENCOUNTER — Ambulatory Visit
Admission: RE | Admit: 2021-03-13 | Discharge: 2021-03-13 | Disposition: A | Discharge status: Home / Self Care | Payer: 59 | Source: Ambulatory Visit | Attending: Internal Medicine | Admitting: Internal Medicine

## 2021-03-13 VITALS — BP 111/76 | HR 93 | Ht 65.0 in | Wt 190.8 lb

## 2021-03-13 DIAGNOSIS — M542 Cervicalgia: Secondary | ICD-10-CM

## 2021-03-13 DIAGNOSIS — M0579 Rheumatoid arthritis with rheumatoid factor of multiple sites without organ or systems involvement: Secondary | ICD-10-CM | POA: Diagnosis not present

## 2021-03-13 DIAGNOSIS — Z79899 Other long term (current) drug therapy: Secondary | ICD-10-CM | POA: Insufficient documentation

## 2021-03-13 DIAGNOSIS — M21941 Unspecified acquired deformity of hand, right hand: Secondary | ICD-10-CM | POA: Insufficient documentation

## 2021-03-13 DIAGNOSIS — Z6830 Body mass index (BMI) 30.0-30.9, adult: Secondary | ICD-10-CM | POA: Insufficient documentation

## 2021-03-13 DIAGNOSIS — E663 Overweight: Secondary | ICD-10-CM

## 2021-03-13 DIAGNOSIS — M4725 Other spondylosis with radiculopathy, thoracolumbar region: Secondary | ICD-10-CM

## 2021-03-13 DIAGNOSIS — R5383 Other fatigue: Secondary | ICD-10-CM | POA: Diagnosis not present

## 2021-03-13 DIAGNOSIS — E661 Drug-induced obesity: Secondary | ICD-10-CM | POA: Insufficient documentation

## 2021-03-13 DIAGNOSIS — R591 Generalized enlarged lymph nodes: Secondary | ICD-10-CM

## 2021-03-13 HISTORY — DX: Overweight: E66.3

## 2021-03-13 HISTORY — DX: Cervicalgia: M54.2

## 2021-03-13 HISTORY — DX: Other long term (current) drug therapy: Z79.899

## 2021-03-13 HISTORY — DX: Generalized enlarged lymph nodes: R59.1

## 2021-03-13 HISTORY — DX: Unspecified acquired deformity of hand, right hand: M21.941

## 2021-03-13 MED ORDER — PREDNISONE 10 MG PO TABS: ORAL_TABLET | ORAL | 0 refills | Status: AC

## 2021-03-13 NOTE — Patient Instructions (Signed)
Please go to [facility] for a chest xray to better evaluate our findings and for a baseline for resuming methotrexate. I placed future lab orders for about 2 weeks to recheck the abnormal liver function test. I sent a prednisone [facility]. We will get started with medication approval for restarting your medications.

## 2021-03-14 NOTE — Progress Notes (Signed)
Chest xray looks okay. The radiologist did not report any abnormal lymph nodes or nodules suggestive of an infection or problem for resuming medications.

## 2021-03-17 ENCOUNTER — Other Ambulatory Visit: Payer: Self-pay

## 2021-03-17 DIAGNOSIS — F17200 Nicotine dependence, unspecified, uncomplicated: Secondary | ICD-10-CM

## 2021-03-17 LAB — ANA: Anti Nuclear Antibody (ANA): POSITIVE — AB

## 2021-03-17 LAB — QUANTIFERON-TB GOLD PLUS
Mitogen-NIL: >10 IU/mL
NIL: 0.09 IU/mL
QuantiFERON-TB Gold Plus: NEGATIVE
TB1-NIL: <0 IU/mL
TB2-NIL: <0 IU/mL

## 2021-03-17 LAB — ANGIOTENSIN CONVERTING ENZYME: Angiotensin-Converting Enzyme: 76 U/L — ABNORMAL HIGH (ref 9–67)

## 2021-03-17 LAB — ANTI-NUCLEAR AB-TITER (ANA TITER): ANA Titer 1: 1:320 {titer} — ABNORMAL HIGH

## 2021-03-17 MED ORDER — VARENICLINE TARTRATE 1 MG PO TABS: 1.0000 mg | ORAL_TABLET | Freq: Two times a day (BID) | ORAL | 1 refills | Status: DC

## 2021-03-18 ENCOUNTER — Telehealth: Payer: Self-pay | Admitting: Pharmacist

## 2021-03-18 NOTE — Telephone Encounter (Addendum)
Submitted a Prior Authorization request to  [facility]  for [employer] via CoverMyMeds. Will update once we receive a response.  Dose: Simponi 50mg  SQ every 28 days Dx: RA (M05.79)  Key: [id number]  Once approved, patient eligible for copay card through manufacturer  ----- Message from Collier Salina, MD sent at 03/18/2021  8:16 AM EST ----- Regarding: Simponi Ms. Buczek transferred care to our office she was on simponi aria infusions before with good disease control. I recommended switching to Simponi [place] treatment and she was agreeable with that if available. We have baseline labs for her. Her CMP was abnormal plan is to recheck this in about a week since it seemed like an acute issue. Thanks!

## 2021-03-18 NOTE — Progress Notes (Signed)
The TB screen is negative as expected. Her chest xray looks good there was no lung disease or abnormality. I am asking our pharmacist about getting restarted with the simponi. I still recommend her to finish the prednisone and return to clinic to repeat the liver function test and make sure this looks okay before we could restart methotrexate.

## 2021-03-19 ENCOUNTER — Ambulatory Visit
Admission: RE | Admit: 2021-03-19 | Discharge: 2021-03-19 | Disposition: A | Discharge status: Home / Self Care | Payer: 59 | Source: Ambulatory Visit | Attending: Physical Medicine and Rehabilitation | Admitting: Physical Medicine and Rehabilitation

## 2021-03-19 ENCOUNTER — Other Ambulatory Visit: Payer: Self-pay

## 2021-03-19 ENCOUNTER — Encounter: Payer: Self-pay | Admitting: Physical Medicine and Rehabilitation

## 2021-03-19 ENCOUNTER — Other Ambulatory Visit: Payer: Self-pay | Admitting: Physical Medicine and Rehabilitation

## 2021-03-19 ENCOUNTER — Encounter: Payer: 59 | Attending: Physical Medicine and Rehabilitation | Admitting: Physical Medicine and Rehabilitation

## 2021-03-19 VITALS — BP 116/80 | HR 101 | Ht 65.0 in | Wt 189.8 lb

## 2021-03-19 DIAGNOSIS — M5416 Radiculopathy, lumbar region: Secondary | ICD-10-CM

## 2021-03-19 DIAGNOSIS — M549 Dorsalgia, unspecified: Secondary | ICD-10-CM | POA: Diagnosis present

## 2021-03-19 DIAGNOSIS — G8929 Other chronic pain: Secondary | ICD-10-CM | POA: Insufficient documentation

## 2021-03-19 DIAGNOSIS — M4125 Other idiopathic scoliosis, thoracolumbar region: Secondary | ICD-10-CM

## 2021-03-19 DIAGNOSIS — M0579 Rheumatoid arthritis with rheumatoid factor of multiple sites without organ or systems involvement: Secondary | ICD-10-CM | POA: Diagnosis present

## 2021-03-19 DIAGNOSIS — M546 Pain in thoracic spine: Secondary | ICD-10-CM

## 2021-03-19 MED ORDER — DULOXETINE HCL 30 MG PO CPEP: 30.0000 mg | ORAL_CAPSULE | Freq: Every day | ORAL | 3 refills | Status: DC

## 2021-03-19 MED ORDER — PROMETHAZINE HCL 12.5 MG PO TABS: 12.5000 mg | ORAL_TABLET | Freq: Four times a day (QID) | ORAL | 1 refills | Status: DC | PRN

## 2021-03-19 NOTE — Patient Instructions (Signed)
Pt is a 66 yr old female with hx of RA- hands, elbows, shoulders and neck-  DJD of "entire back"; borderline diabetes; GERD; no HTN. Also has scoliosis 15-20 degrees thoracolumbar and lumbar radiculopathy base don xray results.   Here for chronic pain and back pain for evaluation.   Suggest OT evaluation to maintain ROM and learning adaptive equipment- also conservation of energy.   Will send for a few therapy appointments. - on small budget, but agrees needs to try this.    2. Has quit smoking x 1 month  3. Xray of thoracic spine- [facility]- can just walk in. To assess back pain.   4. Duloxetine   Duloxetine /Cymbalta 30 mg nightly x 1 week  Then 60 mg nightly- for nerve pain  1% of patients can have nausea with Duloxetine- call me if needs an anti-nausea medicine. Can also cause mild dry mouth/dry eyes and mild constipation.   5. Phenergan 12.5 mg q6 hours as needed #90- for the nausea that can last 7-10 days.    6.  F/U in 3 months-

## 2021-03-19 NOTE — Progress Notes (Signed)
Subjective:    Patient ID: Heidi Chapman, female    DOB: Apr 15, 1955, 66 y.o.   MRN: [id number]  HPI Pt is a 66 yr old R handed female with hx of RA- dx'd 2/21- hands, elbows, shoulders and neck-  DJD of "entire back"; borderline diabetes; GERD; no HTN.   Here for chronic pain and back pain for evaluation.   Pain changes depending on what doing-  The simplest tasks hurts- even washing hair; sitting- standing;  folding clothes and stretching and reaching;   Mainly mid spine-  Mainly throbbing and sometimes muscle spasms.  Also burns in the same locations- but doesn't have pins and needles.   Right now pain is 5/10-    On prednisone 20 mg daily for a few days- 10 day Rx.  On MTX chronically for RA.    Tried: Takes flexeril prn- works well for muscle spasms- - tries to  avoid, due to sedation.  Tried heating pad- uses a lot- very short term Icy hot- topical- helps short term Has tried naproxen, but doesn't tolerate well= makes nauseated and tears stomach up.  Never tried Duloxetine, Lyrica/gabapentin, or other traditional pain meds   Also has radiculopathy Sx's on LLE- to just below knee- intermittent- 1x/week or so. Mainly with prolonged walking.   Social Hx: On disability Handled benefits- on computer all day- cannot type anymore- went on disability 1 + years ago.   Pain Inventory Average Pain 5 Pain Right Now 5 My pain is constant, burning, and aching  In the last 24 hours, has pain interfered with the following? General activity 8 Relation with others 8 Enjoyment of life 8 What TIME of day is your pain at its worst? daytime Sleep (in general) Poor  Pain is worse with: walking, bending, sitting, standing, and some activites Pain improves with: rest, heat/ice, and pacing activities Relief from Meds: 4  walk without assistance how many minutes can you walk? 30 ability to climb steps?  yes do you drive?  yes Do you have any goals in this area?   yes  disabled: date disabled 11/2019 I need assistance with the following:  household duties Do you have any goals in this area?  yes  bladder control problems weakness trouble walking spasms dizziness  Any changes since last visit?  no  Any changes since last visit?  no    Family History  Problem Relation Age of Onset   Cancer - Lung Mother    Brain cancer Mother    Colon polyps Father    Prostate cancer Father    Diverticulitis Father    Heart attack Father    Cancer Maternal Grandmother    Cancer Maternal Grandfather    Cancer Paternal Grandfather    Social History   Socioeconomic History   Marital status: Legally Separated    Spouse name: Not on file   Number of children: Not on file   Years of education: Not on file   Highest education level: Not on file  Occupational History   Not on file  Tobacco Use   Smoking status: Former    Packs/day: 0.50    Types: Cigarettes    Passive exposure: Past   Smokeless tobacco: Never   Tobacco comments:    1/2 pk a day.  Wants Chantex  Vaping Use   Vaping Use: Never used  Substance and Sexual Activity   Alcohol use: Yes    Comment: 1-3 drinks annually   Drug use: Never  Sexual activity: Not on file  Other Topics Concern   Not on file  Social History Narrative   Lives with youngest daughter and granddaughter   Right handed   Caffeine: 1 cup of coffee and 1 soda daily   Social Determinants of Health   Financial Resource Strain: Not on file  Food Insecurity: Not on file  Transportation Needs: Not on file  Physical Activity: Not on file  Stress: Not on file  Social Connections: Not on file   Past Surgical History:  Procedure Laterality Date   CESAREAN SECTION     CHOLECYSTECTOMY N/A 10/24/2017   Procedure: LAPAROSCOPIC CHOLECYSTECTOMY WITH INTRAOPERATIVE CHOLANGIOGRAM;  Surgeon: Erroll Luna, MD;  Location: [facility];  Service: General;  Laterality: N/A;   GALLBLADDER SURGERY     TUBAL LIGATION     Past  Medical History:  Diagnosis Date   Anxiety    Arthritis    Fibromyalgia    GERD (gastroesophageal reflux disease)    Migraine    Polyarthralgia 01/26/2019   Screening for colon cancer 06/28/2020   Snoring 08/29/2020   BP 116/80    Pulse (!) 101    Ht 5\' 5"  (1.651 m)    Wt 189 lb 12.8 oz (86.1 kg)    SpO2 94%    BMI 31.58 kg/m   Opioid Risk Score:   Fall Risk Score:  `1  Depression screen PHQ 2/9  Depression screen [facility] 2/9 03/19/2021 06/28/2020 12/14/2019 11/30/2019 11/23/2019 03/13/2019 01/26/2019  Decreased Interest 0 1 0 0 2 0 1  Down, Depressed, Hopeless 0 0 0 0 0 0 1  PHQ - 2 Score 0 1 0 0 2 0 2  Altered sleeping 3 3 - 0 2 0 2  Tired, decreased energy 3 3 0 0 2 3 3   Change in appetite 1 1 0 0 2 0 3  Feeling bad or failure about yourself  0 0 0 0 0 0 0  Trouble concentrating 3 0 0 0 0 0 3  Moving slowly or fidgety/restless 0 0 0 0 0 0 2  Suicidal thoughts 0 0 0 0 0 0 0  PHQ-9 Score 10 8 - 0 8 3 15   Difficult doing work/chores Somewhat difficult Somewhat difficult - Not difficult at all Somewhat difficult Not difficult at all Not difficult at all  Some recent data might be hidden     Review of Systems  Constitutional:  Positive for unexpected weight change.  HENT: Negative.    Eyes: Negative.   Respiratory:  Positive for apnea, cough and shortness of breath.   Cardiovascular: Negative.   Gastrointestinal: Negative.   Endocrine: Negative.   Genitourinary:  Positive for urgency.  Musculoskeletal:  Positive for back pain, neck pain and neck stiffness.  Skin: Negative.   Allergic/Immunologic: Negative.   Neurological:  Positive for dizziness and weakness.  Hematological: Negative.   Psychiatric/Behavioral:  Positive for sleep disturbance.       Objective:   Physical Exam  Awake, alert, appropriate, appropriate weight, NAD  MS: Hands show signs of moderate RA- esp some effusions of 2nd digits B/L  Strength-  UE- 5-/5 in deltoids, biceps, and triceps- 5/5 in WE, grip  and FA B/L  LE's- 5-/5 HF, KE/ 5-/5; KE 5-/5; DF/PF 5/5- all B/L   Trigger points down entire spine- from Cervical spine to lumbar spine in paraspinals as well as in rhomboids, splenius capitus and upper traps B/L   Also has lymph node palpable R>L on cervical paraspinals- back  of neck- lima bean sized on R and pea sized on L      Reviewed imaging independently from outside films- Thoracolumbar scoliosis- 15-20 degrees- Marked R facet arthropathy seen L2/3 and l3/4-  Assessment & Plan:   Pt is a 65 yr old female with hx of RA- hands, elbows, shoulders and neck-  DJD of "entire back"; borderline diabetes; GERD; no HTN. Also has scoliosis 15-20 degrees thoracolumbar and lumbar radiculopathy base don xray results.   Here for chronic pain and back pain for evaluation.   Suggest OT evaluation to maintain ROM and learning adaptive equipment- also conservation of energy.   Will send for a few therapy appointments. - on small budget, but agrees needs to try this.    2. Has quit smoking x 1 month  3. Xray of thoracic spine- [place]- can just walk in. To assess back pain.   4. Duloxetine   Duloxetine /Cymbalta 30 mg nightly x 1 week  Then 60 mg nightly- for nerve pain  1% of patients can have nausea with Duloxetine- call me if needs an anti-nausea medicine. Can also cause mild dry mouth/dry eyes and mild constipation.   5. Phenergan 12.5 mg q6 hours as needed #90- for the nausea that can last 7-10 days.    6.  F/U in 3 months-   I spent  45   minutes on appointment today; more than 50% of that time was spent on educating patient as per details/plan above. Esp on energy conservation for RA and side effects of meds.

## 2021-03-20 NOTE — Telephone Encounter (Signed)
Received notice from [facility] stating that PA is unable to be processed due to requested medication not being on formulary. They request that we reach out to [employer] directly for this request.  Will temporarily leave fax in onbase for ease of access while following up.

## 2021-03-24 NOTE — Telephone Encounter (Signed)
Called [employer] for clarification on denial. [facility] is unable to review this product for a coverage determination as the requested medicaton is not on the patient's formulary. Per rep, the formulary alternative are Humira and Simponi 100mg  only  Per [employer], Exception to Benefits request will have to be completed through appeals department. Rep at appeals department states that email is the fastest way to process Exception to Benefits request. Patient states she started Simponi Aria infusions in April or May 2022.  Phone: [phone]  Faxed Exception to Benefits request to [employer] via email and emailed documents Email: [email or web address] Fax: [phone]  Knox Saliva, PharmD, MPH, BCPS Clinical Pharmacist (Rheumatology and Pulmonology)

## 2021-03-31 ENCOUNTER — Ambulatory Visit: Payer: 59 | Admitting: Occupational Therapy

## 2021-04-02 NOTE — Telephone Encounter (Signed)
Called [employer]. Per rep they had case closed d/t no response from patient. I advised that patient returned call up to 6 times without return call. Rep investigated and found patient's voicemail Case has been reopened.  Will receive determination in 72 hours  Knox Saliva, PharmD, MPH, BCPS Clinical Pharmacist (Rheumatology and Pulmonology)

## 2021-04-02 NOTE — Telephone Encounter (Signed)
Received letter from [employer] stating that appeal request was received on 03/24/21 but they need written or verbal authorization to proceed with reviewing appeal request. I called patient and she states that she returned call the same day that [employer] [place] [employer]) called her and attempted to return call 5 or 6 times thereafter. She states she has not heard anything back from [place].  ATC [employer] but leads to VM stating there is no one to answer call. Will call after 9am  Knox Saliva, PharmD, MPH, BCPS Clinical Pharmacist (Rheumatology and Pulmonology)

## 2021-04-03 ENCOUNTER — Ambulatory Visit: Payer: 59 | Admitting: Internal Medicine

## 2021-04-08 ENCOUNTER — Other Ambulatory Visit: Payer: Self-pay | Admitting: *Deleted

## 2021-04-08 ENCOUNTER — Ambulatory Visit: Payer: 59 | Attending: Physical Medicine and Rehabilitation | Admitting: Occupational Therapy

## 2021-04-08 ENCOUNTER — Other Ambulatory Visit: Payer: Self-pay

## 2021-04-08 DIAGNOSIS — R278 Other lack of coordination: Secondary | ICD-10-CM | POA: Diagnosis present

## 2021-04-08 DIAGNOSIS — M25641 Stiffness of right hand, not elsewhere classified: Secondary | ICD-10-CM | POA: Diagnosis present

## 2021-04-08 DIAGNOSIS — M6281 Muscle weakness (generalized): Secondary | ICD-10-CM

## 2021-04-08 DIAGNOSIS — M25542 Pain in joints of left hand: Secondary | ICD-10-CM | POA: Diagnosis present

## 2021-04-08 DIAGNOSIS — M25541 Pain in joints of right hand: Secondary | ICD-10-CM | POA: Diagnosis present

## 2021-04-08 DIAGNOSIS — Z79899 Other long term (current) drug therapy: Secondary | ICD-10-CM

## 2021-04-08 DIAGNOSIS — M25642 Stiffness of left hand, not elsewhere classified: Secondary | ICD-10-CM

## 2021-04-08 NOTE — Patient Instructions (Signed)
Joint Protection (Grip)    Avoid: grasping thin utensils for prolonged periods. Solution: Hold thick-handled tools in dagger fashion when-ever possible for performing tasks such as stirring or scrub-bing. Relax fingers every 10 minutes during activity.  Joint Protection (Lifting)    Avoid picking up heavy items with one hand. Solution: Use both hands, and slide item whenever possible.  Joint Protection (Use Large Joints)    Avoid placing pressure on fingertips. Solution: Transfer work to other parts of body which are not affected or which have greater strength. Using body weight to push heavy doors open is an example.  Joint Protection (Carrying)    Avoid carrying items with weight on fingers. Solution: Use a shoulder bag or a back pack.  Joint Protection (Ulnar Deviation)    Avoid positions that cause fingers to lean sideways toward little finger. Solution: Use devices like jar-openers to assist in activities.  Copyright  VHI. All rights reserved.

## 2021-04-08 NOTE — Therapy (Signed)
[facility] [address] [place], [place], [address] Phone: [phone]   Fax:  [phone]  Occupational Therapy Evaluation  Patient Details  Name: Heidi Chapman MRN: [id number] Date of Birth: 16-Oct-1955 Referring Provider (OT): Courtney Heys, MD ([place] covering RA)   Encounter Date: 04/08/2021   OT End of Session - 04/08/21 1210     Visit Number 1    Number of Visits 5    Date for OT Re-Evaluation 05/20/21    Authorization Type [employer]    Authorization Time Period unknown    OT Start Time 0930    OT Stop Time 1015    OT Time Calculation (min) 45 min    Activity Tolerance Patient tolerated treatment well    Behavior During Therapy [facility] for tasks assessed/performed             Past Medical History:  Diagnosis Date   Anxiety    Arthritis    Fibromyalgia    GERD (gastroesophageal reflux disease)    Migraine    Polyarthralgia 01/26/2019   Screening for colon cancer 06/28/2020   Snoring 08/29/2020    Past Surgical History:  Procedure Laterality Date   CESAREAN SECTION     CHOLECYSTECTOMY N/A 10/24/2017   Procedure: LAPAROSCOPIC CHOLECYSTECTOMY WITH INTRAOPERATIVE CHOLANGIOGRAM;  Surgeon: Erroll Luna, MD;  Location: [facility];  Service: General;  Laterality: N/A;   GALLBLADDER SURGERY     TUBAL LIGATION      There were no vitals filed for this visit.   Subjective Assessment - 04/08/21 0905     Subjective  Pt is a 66 year old that presents to Neuro OPOT with Rheumatoid Arthritis diagnosed in February 2021.Marland Kitchen Pt has never received education for this condition and is presenting for evaluation and treat for adaptive equipment, education on energy conservation techniques and activity modification and strategies for progressing of RA. Pt reports that "hands are not working like they used to" and wanting to get adaptive strategies and equipment for managing RA. Pt lives with her daughter and 91 year old  granddaughter and does not report any difficulty with accessing home. Pt is reporting decreased ease with some tasks with ADLs and IADLs since progression of RA.    Pertinent History RA, Osteoarthritis (neck and back), prediabetes    Currently in Pain? Yes    Pain Score 3     Pain Location Hand    Pain Orientation Right;Left    Pain Descriptors / Indicators Aching;Sore    Pain Type Chronic pain    Pain Onset More than a month ago    Pain Frequency Constant               [facility] OT Assessment - 04/08/21 0936       Assessment   Medical Diagnosis Rheumatoid Arthrotis    Referring Provider (OT) Courtney Heys, MD   [facility] covering RA   Onset Date/Surgical Date --   dx Feb 2021 RA   Hand Dominance Right      Precautions   Required Braces or Orthoses Other Brace/Splint    Other Brace/Splint compression glove BUE , wrist braces      Balance Screen   Has the patient fallen in the past 6 months Yes    How many times? 2   reports vertigo     Home  Environment   Family/patient expects to be discharged to: Private residence    Living Arrangements Children   daughter and granddaughter, 2  dogs, 5 cats   Available Help at Discharge Family    Type of [place] Other (Comment)   able to access home without difficulty   Bathroom Shower/Tub Tub/Shower unit    Bathroom Accessibility Yes      Prior Function   Level of Independence Independent    Vocation On disability    Leisure container gardening, animals, cat rescue, reading      ADL   Eating/Feeding Modified independent    Grooming Modified independent   painful, increased time   Upper Body Bathing Modified independent    Lower Body Bathing Modified independent    Upper Body Dressing Increased time    Lower Body Dressing Increased time    Toilet Transfer Modified independent    Toileting - Clothing Manipulation Modified independent    [place]    Tub/Shower Transfer Modified  independent   very carefully     IADL   Shopping Shops independently for small purchases   typically has it delivered but usually go 2x a week and it is challenging   Light Housekeeping Performs light daily tasks such as dishwashing, bed making   doing laundry with some difficulty   Meal Prep Able to complete simple cold meal and snack prep   does not trust self with heavier items   Community Mobility Drives own vehicle    Medication Management Is responsible for taking medication in correct dosages at correct time    Physiological scientist financial matters independently (budgets, writes checks, pays rent, bills goes to bank), collects and keeps track of income      Written Expression   Dominant Hand Right   painful and challenging but does not report legibility changes     Vision - History   Baseline Vision Wears glasses only for reading    Additional Comments denies any changes      Activity Tolerance   Activity Tolerance Tolerates 30 min activity with multiple rests      Observation/Other Assessments   Focus on Therapeutic Outcomes (FOTO)  N/A      Sensation   Light Touch Appears Intact    Hot/Cold Appears Intact      Coordination   9 Hole Peg Test Right;Left    Right 9 Hole Peg Test 25.43s    Left 9 Hole Peg Test 27.91s      ROM / Strength   AROM / PROM / Strength AROM;Strength      AROM   Overall AROM  Within functional limits for tasks performed      Strength   Overall Strength Within functional limits for tasks performed    Overall Strength Comments complained of pain in BUE during Abduction testing      Hand Function   Right Hand Gross Grasp Impaired   more limited with composite flexion with RUE   Right Hand Grip (lbs) 24.6    Right Hand Lateral Pinch 7 lbs    Right Hand 3 Point Pinch 5 lbs    Left Hand Gross Grasp Functional    Left Hand Grip (lbs) 38.1    Left Hand Lateral Pinch 7 lbs    Left 3 point pinch 4 lbs    Comment Tip R 4, L 5                               OT Education - 04/08/21  1204     Education Details joint protection strategies    Person(s) Educated Patient    Methods Explanation;Handout    Comprehension Verbalized understanding                 OT Long Term Goals - 04/08/21 1207       OT LONG TERM GOAL #1   Title Pt will be independent with HEP    Time 6    Period Weeks    Status New    Target Date 05/20/21      OT LONG TERM GOAL #2   Title Pt will verbalize understanding of joint protection strategies    Time 6    Period Weeks    Status New      OT LONG TERM GOAL #3   Title Pt will verbalize understanding of adapted strategies and/or equipment for increasing independence and decreasing pain with ADLs and IADLs. (cooking, zippers, folding laundry, etc)    Time 6    Period Weeks    Status New      OT LONG TERM GOAL #4   Title Pt will verbalize and demonstrate understanding of any splint wear and care instructions PRN    Time 6    Period Weeks    Status New      OT LONG TERM GOAL #5   Title Pt will verbalize understanding of energy conservation strategies    Time 6    Period Weeks    Status New                   Plan - 04/08/21 1159     Clinical Impression Statement Pt is a 66 year old that presents to Neuro OPOT with Rheumatoid Arthritis diagnosed in February 2021. Pt presents with decreased BUE strength, coordination, increased pain and discomfort and stiffness. Pt would benefit from skilled occupational therapy to target listed areas of deficit and education for activity modifications, splints PRN, joint protection strateges, energy conservation techniques and overall condition progression.    OT Occupational Profile and History Problem Focused Assessment - Including review of records relating to presenting problem    Occupational performance deficits (Please refer to evaluation for details): ADL's;IADL's;Leisure    Body Structure / Function / Physical  Skills Strength;ADL;Decreased knowledge of use of DME;Coordination;IADL;ROM;UE functional use;Pain;Dexterity;GMC;Flexibility;FMC    Rehab Potential Good    Clinical Decision Making Limited treatment options, no task modification necessary    Comorbidities Affecting Occupational Performance: None    Modification or Assistance to Complete Evaluation  No modification of tasks or assist necessary to complete eval    OT Frequency 1x / week    OT Duration 6 weeks   4 visits over 6 weeks d/t any scheduling conflicts   OT Treatment/Interventions Self-care/ADL training;Moist Heat;Fluidtherapy;DME and/or AE instruction;Splinting;Therapeutic activities;Contrast Bath;Ultrasound;Therapeutic exercise;Passive range of motion;Paraffin;Energy conservation;Manual Therapy;Patient/family education    Plan possible resting hand splints for BUE for PM wear, energy conservation techniques, review joint protection strategies, add equipment recommendations for built up grips, etc.             Patient will benefit from skilled therapeutic intervention in order to improve the following deficits and impairments:   Body Structure / Function / Physical Skills: Strength, ADL, Decreased knowledge of use of DME, Coordination, IADL, ROM, UE functional use, Pain, Dexterity, GMC, Flexibility, [facility]       Visit Diagnosis: Muscle weakness (generalized) - Plan: Ot plan of care cert/re-cert  Other lack of coordination - Plan:  Ot plan of care cert/re-cert  Stiffness of left hand, not elsewhere classified - Plan: Ot plan of care cert/re-cert  Stiffness of right hand, not elsewhere classified - Plan: Ot plan of care cert/re-cert  Pain in joint of right hand - Plan: Ot plan of care cert/re-cert  Pain in joint of left hand - Plan: Ot plan of care cert/re-cert    Problem List Patient Active Problem List   Diagnosis Date Noted   Chronic bilateral back pain 03/19/2021   Other idiopathic scoliosis, thoracolumbar region  03/19/2021   Lumbar radiculopathy 03/19/2021   High risk medication use 03/13/2021   Neck pain 03/13/2021   Overweight 03/13/2021   Body mass index (BMI) 30.0-30.9, adult 03/13/2021   Acquired deformity of right hand 03/13/2021   Lymphadenopathy 03/13/2021   Osteoarthritis 11/18/2020   Orthopnea 08/29/2020   Shortness of breath at rest 08/29/2020   Obstructive sleep apnea 08/29/2020   Fatigue 06/28/2020   Shortness of breath 06/28/2020   Screening mammogram for breast cancer 06/28/2020   Hyperlipidemia 06/28/2020   Prediabetes 06/28/2020   Tobacco use disorder 06/28/2020   Sinusitis, acute, maxillary 11/15/2019   CAP (community acquired pneumonia) 11/01/2019   Rheumatoid arthritis involving multiple sites with positive rheumatoid factor ([place]) 03/13/2019   Acute left-sided low back pain without sciatica 03/13/2019   Anxiety 01/26/2019   Healthcare maintenance 12/06/2017   Acalculous cholecystitis 10/23/2017   Benign neoplasm of colon 12/14/2011   Absence of both cervix and uterus, acquired 12/14/2011    Zachery Conch, OT 04/08/2021, 12:11 PM  [facility] [address] [place], [place], [address] Phone: [phone]   Fax:  [phone]  Name: Heidi Chapman MRN: [id number] Date of Birth: Jan 02, 1956

## 2021-04-09 LAB — COMPLETE METABOLIC PANEL WITH GFR
AG Ratio: 1.4 (calc) (ref 1.0–2.5)
ALT: 33 U/L — ABNORMAL HIGH (ref 6–29)
AST: 26 U/L (ref 10–35)
Albumin: 3.8 g/dL (ref 3.6–5.1)
Alkaline phosphatase (APISO): 147 U/L (ref 37–153)
BUN: 14 mg/dL (ref 7–25)
CO2: 26 mmol/L (ref 20–32)
Calcium: 9.6 mg/dL (ref 8.6–10.4)
Chloride: 110 mmol/L (ref 98–110)
Creat: 0.81 mg/dL (ref 0.50–1.05)
Globulin: 2.8 g/dL (calc) (ref 1.9–3.7)
Glucose, Bld: 92 mg/dL (ref 65–99)
Potassium: 4.2 mmol/L (ref 3.5–5.3)
Sodium: 143 mmol/L (ref 135–146)
Total Bilirubin: 0.7 mg/dL (ref 0.2–1.2)
Total Protein: 6.6 g/dL (ref 6.1–8.1)
eGFR: 82 mL/min/{1.73_m2} (ref >=60)

## 2021-04-10 NOTE — Telephone Encounter (Signed)
Received denial from [employer] stating that patient must try Simponi 100mg  PFS or Humira. Routing to Dr. Benjamine Mola for advise on switching to Humira. Will be approved relatively quickly, but Simponi will likely be denied again unless there is medical reason that patient cannot switch.  Knox Saliva, PharmD, MPH, BCPS Clinical Pharmacist (Rheumatology and Pulmonology)

## 2021-04-10 NOTE — Telephone Encounter (Signed)
Spoke with rep at [employer] who informs me that pt's EtB request has been denied. Currently awaiting determination letter to be faxed to office for f/u.

## 2021-04-11 NOTE — Telephone Encounter (Signed)
I am okay with the switch due to same mechanism of action. If the patient is concerned about changing, then would simponi Heidi Chapman be an option instead of subcutaneous?

## 2021-04-14 ENCOUNTER — Telehealth: Payer: Self-pay | Admitting: Pharmacist

## 2021-04-14 NOTE — Telephone Encounter (Signed)
Spoke with patient regarding switching to Humira. She is amenable to this. Will start new encounter for Humira BIV  Knox Saliva, PharmD, MPH, BCPS Clinical Pharmacist (Rheumatology and Pulmonology)

## 2021-04-14 NOTE — Telephone Encounter (Signed)
Prior authorization form for Humira 40mg  SQ every 14 days faxed to [employer] with chart notes.  Phone: [phone] Fax: [phone]  Once approved, patient is eligible for copay savings card  Knox Saliva, PharmD, MPH, BCPS Clinical Pharmacist (Rheumatology and Pulmonology)

## 2021-04-15 ENCOUNTER — Ambulatory Visit: Payer: 59 | Admitting: Occupational Therapy

## 2021-04-15 ENCOUNTER — Encounter: Payer: Self-pay | Admitting: Occupational Therapy

## 2021-04-15 ENCOUNTER — Other Ambulatory Visit (HOSPITAL_COMMUNITY): Payer: Self-pay

## 2021-04-15 ENCOUNTER — Other Ambulatory Visit: Payer: Self-pay

## 2021-04-15 DIAGNOSIS — M25641 Stiffness of right hand, not elsewhere classified: Secondary | ICD-10-CM

## 2021-04-15 DIAGNOSIS — R278 Other lack of coordination: Secondary | ICD-10-CM

## 2021-04-15 DIAGNOSIS — M25542 Pain in joints of left hand: Secondary | ICD-10-CM

## 2021-04-15 DIAGNOSIS — M6281 Muscle weakness (generalized): Secondary | ICD-10-CM | POA: Diagnosis not present

## 2021-04-15 DIAGNOSIS — M25541 Pain in joints of right hand: Secondary | ICD-10-CM

## 2021-04-15 DIAGNOSIS — M25642 Stiffness of left hand, not elsewhere classified: Secondary | ICD-10-CM

## 2021-04-15 NOTE — Patient Instructions (Signed)
Energy Conservation Techniques  Sit for as many activities as possible. Use slow, smooth movements.  Rushing increases discomfort. Determine the necessity of performing the task.  Simplify those tasks that are necessary.  (Get clothes out of the dryer when they are warm instead of ironing, let dishes air dry, etc.) Take frequent rests both during and between activities.  Avoid repetitive tasks. Pre-plan your activities; try a daily and/or weekly schedule.  Spread out the activities that are most fatiguing (break up cleaning tasks over multiple days). Remember to plan a balance of work, rest and recreation. Consider the best time for each activity.  Do the most exertive task when you have the most energy. Don't carry items if you can push them.  Slide, don't lift. Push, don't pull. Utilize two hands when appropriate. Maintain good posture and use proper body mechanics. Avoid remaining in one position for too long. When lifting, bend at the knees, not at the waist.  Exhale when bending down, inhale when straightening up. Carry objects as close to your body and as near to the center of the pelvis.  11. Avoid wasted body movements (position yourself for the task so that you avoid bending, twisting, etc. when possible). 12. Select the best working environment.  Consider lighting, ventilation, clothing, and equipment. 13. Organize your storage areas, making the items you use daily convenient.  Store heaviest items at waist            height.  Store frequently used items between shoulders and knee height.  Consider leaving frequently used       items on countertops.  (You can organize in storage baskets based on time used/purpose). 14. Feelings and emotions can be real causes of fatigue.  Try to avoid unnecessary worry, irritation, or  frustration.  Avoid stress, it can also be a source of fatigue. 15. Get help from other people for difficult tasks. 16. Explore equipment or items that may be able to do  the job for you with greater ease.  (Electric can  openers, blenders, lightweight items for cleaning, etc.) 

## 2021-04-15 NOTE — Telephone Encounter (Signed)
Received notification from  [employer]  regarding a prior authorization for HUMIRA. Authorization has been APPROVED from 04/14/21 to 04/14/22.   Unable to run test claim, as patient must fill through [place] # [phone]  Entered patient into [employer] Complete Pro for copay card enrollment. Awaiting copay card information to populate  Knox Saliva, PharmD, MPH, BCPS Clinical Pharmacist (Rheumatology and Pulmonology)

## 2021-04-15 NOTE — Therapy (Signed)
[facility] [address] [place], [place], [address] Phone: [phone]   Fax:  [phone]  Occupational Therapy Treatment  Patient Details  Name: Heidi Chapman MRN: [id number] Date of Birth: 1956/01/22 Referring Provider (OT): Courtney Heys, MD ([facility] covering RA)   Encounter Date: 04/15/2021   OT End of Session - 04/15/21 0934     Visit Number 2    Number of Visits 5    Date for OT Re-Evaluation 05/20/21    Authorization Type [employer]    Authorization Time Period unknown    OT Start Time 0930    OT Stop Time 1008    OT Time Calculation (min) 38 min    Activity Tolerance Patient tolerated treatment well    Behavior During Therapy [facility] for tasks assessed/performed             Past Medical History:  Diagnosis Date   Anxiety    Arthritis    Fibromyalgia    GERD (gastroesophageal reflux disease)    Migraine    Polyarthralgia 01/26/2019   Screening for colon cancer 06/28/2020   Snoring 08/29/2020    Past Surgical History:  Procedure Laterality Date   CESAREAN SECTION     CHOLECYSTECTOMY N/A 10/24/2017   Procedure: LAPAROSCOPIC CHOLECYSTECTOMY WITH INTRAOPERATIVE CHOLANGIOGRAM;  Surgeon: Erroll Luna, MD;  Location: [facility];  Service: General;  Laterality: N/A;   GALLBLADDER SURGERY     TUBAL LIGATION      There were no vitals filed for this visit.   Subjective Assessment - 04/15/21 0933     Subjective  "my hands are getting worse"    Pertinent History RA, Osteoarthritis (neck and back), prediabetes    Currently in Pain? Yes    Pain Score 5     Pain Location Hand    Pain Orientation Left;Right    Pain Descriptors / Indicators Aching;Sore    Pain Type Chronic pain    Pain Onset More than a month ago    Pain Frequency Constant                          OT Treatments/Exercises (OP) - 04/15/21 0948       ADLs   Overall ADLs went over energy conservation strategies - issued  handout    Eating pt reports purchasing built up handled utensils for feeding that has been useful    ADL Comments pt purchased jar opener but has not had to use it yet      Exercises   Exercises Hand      Fine Motor Coordination (Hand/Wrist)   Fine Motor Coordination Small Pegboard;In hand manipuation training;Flipping cards;Picking up coins;Manipulating coins    In Hand Manipulation Training stacking and manipulating coins with RUE and LUE - pt with increased report of discomfort and pain after repetitive in hand manipulation with BUE    Small Pegboard copied image with some discrepancy with blue/purple but overall accuracy. Pt used RUE for manipulating pegs with min drops and difficulty.    Flipping cards flipping cards with BUE    Picking up coins with RUE and LUE    Manipulating coins with RUE and LUE                    OT Education - 04/15/21 0945     Education Details energy conservation strategies    Person(s) Educated Patient    Methods Explanation;Handout    Comprehension  Verbalized understanding                 OT Long Term Goals - 04/15/21 0947       OT LONG TERM GOAL #1   Title Pt will be independent with HEP    Time 6    Period Weeks    Status On-going    Target Date 05/20/21      OT LONG TERM GOAL #2   Title Pt will verbalize understanding of joint protection strategies    Time 6    Period Weeks    Status On-going      OT LONG TERM GOAL #3   Title Pt will verbalize understanding of adapted strategies and/or equipment for increasing independence and decreasing pain with ADLs and IADLs. (cooking, zippers, folding laundry, etc)    Time 6    Period Weeks    Status On-going      OT LONG TERM GOAL #4   Title Pt will verbalize and demonstrate understanding of any splint wear and care instructions PRN    Time 6    Period Weeks    Status New      OT LONG TERM GOAL #5   Title Pt will verbalize understanding of energy conservation strategies     Time 6    Period Weeks    Status New                   Plan - 04/15/21 2549     Clinical Impression Statement Pt has verbalized understanding of goals set at evaluation.    OT Occupational Profile and History Problem Focused Assessment - Including review of records relating to presenting problem    Occupational performance deficits (Please refer to evaluation for details): ADL's;IADL's;Leisure    Body Structure / Function / Physical Skills Strength;ADL;Decreased knowledge of use of DME;Coordination;IADL;ROM;UE functional use;Pain;Dexterity;GMC;Flexibility;FMC    Rehab Potential Good    Clinical Decision Making Limited treatment options, no task modification necessary    Comorbidities Affecting Occupational Performance: None    Modification or Assistance to Complete Evaluation  No modification of tasks or assist necessary to complete eval    OT Frequency 1x / week    OT Duration 6 weeks   4 visits over 6 weeks d/t any scheduling conflicts   OT Treatment/Interventions Self-care/ADL training;Moist Heat;Fluidtherapy;DME and/or AE instruction;Splinting;Therapeutic activities;Contrast Bath;Ultrasound;Therapeutic exercise;Passive range of motion;Paraffin;Energy conservation;Manual Therapy;Patient/family education    Plan paraffin, see if wanting resting hand splints BUE possibly, see how pain is - issued yellow theraputty if minimal pain             Patient will benefit from skilled therapeutic intervention in order to improve the following deficits and impairments:   Body Structure / Function / Physical Skills: Strength, ADL, Decreased knowledge of use of DME, Coordination, IADL, ROM, UE functional use, Pain, Dexterity, GMC, Flexibility, [facility]       Visit Diagnosis: Other lack of coordination  Muscle weakness (generalized)  Stiffness of left hand, not elsewhere classified  Stiffness of right hand, not elsewhere classified  Pain in joint of right hand  Pain in joint of  left hand    Problem List Patient Active Problem List   Diagnosis Date Noted   Chronic bilateral back pain 03/19/2021   Other idiopathic scoliosis, thoracolumbar region 03/19/2021   Lumbar radiculopathy 03/19/2021   High risk medication use 03/13/2021   Neck pain 03/13/2021   Overweight 03/13/2021   Body mass index (BMI) 30.0-30.9, adult 03/13/2021  Acquired deformity of right hand 03/13/2021   Lymphadenopathy 03/13/2021   Osteoarthritis 11/18/2020   Orthopnea 08/29/2020   Shortness of breath at rest 08/29/2020   Obstructive sleep apnea 08/29/2020   Fatigue 06/28/2020   Shortness of breath 06/28/2020   Screening mammogram for breast cancer 06/28/2020   Hyperlipidemia 06/28/2020   Prediabetes 06/28/2020   Tobacco use disorder 06/28/2020   Sinusitis, acute, maxillary 11/15/2019   CAP (community acquired pneumonia) 11/01/2019   Rheumatoid arthritis involving multiple sites with positive rheumatoid factor ([place]) 03/13/2019   Acute left-sided low back pain without sciatica 03/13/2019   Anxiety 01/26/2019   Healthcare maintenance 12/06/2017   Acalculous cholecystitis 10/23/2017   Benign neoplasm of colon 12/14/2011   Absence of both cervix and uterus, acquired 12/14/2011    Heidi Chapman, OT 04/15/2021, 10:13 AM  [facility] [address] [place], [place], [address] Phone: [phone]   Fax:  [phone]  Name: Heidi Chapman MRN: [id number] Date of Birth: [birth year]

## 2021-04-16 NOTE — Telephone Encounter (Signed)
Humira copay card information per Complete Pro portal: ? ?ID: [id number] ?Issued: 04/15/2021 ?Rx GROUP: [id number] ?Rx BIN: [id number] ?Rx PCN: [facility] ? ?Patient scheduled for Humira new start on 04/17/21 ? ?Knox Saliva, PharmD, MPH, BCPS ?Clinical Pharmacist (Rheumatology and Pulmonology) ?

## 2021-04-17 ENCOUNTER — Ambulatory Visit (INDEPENDENT_AMBULATORY_CARE_PROVIDER_SITE_OTHER): Payer: 59 | Admitting: Pharmacist

## 2021-04-17 ENCOUNTER — Other Ambulatory Visit: Payer: Self-pay

## 2021-04-17 VITALS — BP 142/82 | HR 89

## 2021-04-17 DIAGNOSIS — Z79899 Other long term (current) drug therapy: Secondary | ICD-10-CM

## 2021-04-17 DIAGNOSIS — Z7189 Other specified counseling: Secondary | ICD-10-CM

## 2021-04-17 DIAGNOSIS — M0579 Rheumatoid arthritis with rheumatoid factor of multiple sites without organ or systems involvement: Secondary | ICD-10-CM

## 2021-04-17 MED ORDER — HUMIRA (2 PEN) 40 MG/0.4ML ~~LOC~~ AJKT: 40.0000 mg | AUTO-INJECTOR | SUBCUTANEOUS | 2 refills | Status: DC

## 2021-04-17 NOTE — Progress Notes (Signed)
Pharmacy Note ? ?Subjective:   ?Patient presents to clinic today to receive first dose of Humira. Patient has tried leflunomide in the past (stopped due to intolerability). She did not have clinical response to Enbrel. She was also unable to tolerate oral methotrexate. She was established on MTX 17.5mg  SQ weekly and Simponi Aria infusions - had elevated LFTs since starting per patient recollection. After discontinuing MTX in February and repeat CMP, her LFTs normalized. ? ?Patient running a fever or have signs/symptoms of infection? No ? ?Patient currently on antibiotics for the treatment of infection? No ? ?Patient have any upcoming invasive procedures/surgeries? No ? ?Objective: ?CMP  ?   ?Component Value Date/Time  ? NA 143 04/08/2021 1339  ? NA 142 06/28/2020 1117  ? K 4.2 04/08/2021 1339  ? CL 110 04/08/2021 1339  ? CO2 26 04/08/2021 1339  ? GLUCOSE 92 04/08/2021 1339  ? BUN 14 04/08/2021 1339  ? BUN 15 06/28/2020 1117  ? CREATININE 0.81 04/08/2021 1339  ? CALCIUM 9.6 04/08/2021 1339  ? PROT 6.6 04/08/2021 1339  ? PROT 6.9 06/28/2020 1117  ? ALBUMIN 3.6 03/10/2021 1202  ? ALBUMIN 4.6 06/28/2020 1117  ? AST 26 04/08/2021 1339  ? ALT 33 (H) 04/08/2021 1339  ? ALKPHOS 149 (H) 03/10/2021 1202  ? BILITOT 0.7 04/08/2021 1339  ? BILITOT 0.7 06/28/2020 1117  ? GFRNONAA >60 03/10/2021 1202  ? GFRAA 84 01/26/2019 1102  ? ? ?CBC ?   ?Component Value Date/Time  ? WBC 7.3 03/10/2021 1202  ? RBC 4.49 03/10/2021 1202  ? HGB 14.6 03/10/2021 1202  ? HGB 15.7 06/28/2020 1117  ? HCT 41.4 03/10/2021 1202  ? HCT 46.7 (H) 06/28/2020 1117  ? PLT 241 03/10/2021 1202  ? PLT 236 06/28/2020 1117  ? MCV 92.2 03/10/2021 1202  ? MCV 94 06/28/2020 1117  ? MCH 32.5 03/10/2021 1202  ? MCHC 35.3 03/10/2021 1202  ? RDW 12.3 03/10/2021 1202  ? RDW 13.8 06/28/2020 1117  ? LYMPHSABS 1.8 03/10/2021 1202  ? LYMPHSABS 2.4 06/28/2020 1117  ? MONOABS 0.7 03/10/2021 1202  ? EOSABS 0.1 03/10/2021 1202  ? EOSABS 0.1 06/28/2020 1117  ? BASOSABS 0.1  03/10/2021 1202  ? BASOSABS 0.1 06/28/2020 1117  ? ? ?Baseline Immunosuppressant Therapy Labs ?TB GOLD ?Quantiferon TB Gold Latest Ref Rng & Units 03/13/2021  ?Quantiferon TB Gold Plus NEGATIVE NEGATIVE  ? ?Hepatitis Panel ?03/27/2019 ?Hep A Ab, IgM Negative  ?HBsAg Screen Negative  ?Hep B Core Ab, IgM Negative  ?Hep C Virus Ab <0.1  ? ?HIV ?Lab Results  ?Component Value Date  ? HIV Non Reactive 10/23/2017  ? ?SPEP ?Serum Protein Electrophoresis Latest Ref Rng & Units 04/08/2021  ?Total Protein 6.1 - 8.1 g/dL 6.6  ? ?Chest x-ray: 03/14/21 - Improved aeration of the lungs compared to the prior plain film, with left basilar scarring and no evidence of acute cardiopulmonary disease. ? ?Assessment/Plan:  ?Demonstrated proper injection technique with Humira demo device  Patient able to demonstrate proper injection technique using the teach back method.  Patient self injected in the right lower abdomen with: ? ?Sample Medication: Humira 40mg /0.65mL autoinjector pen ?[place]: [id number] ?Lot: [id number] ?Expiration: 05/2022 ? ?Patient tolerated well.  Observed for 30 mins in office for adverse reaction and none noted.  ? ?Patient is to return in 1 month for labs and follow-up appointment.  Standing orders for CBC and CMP placed. Patient states that she does see dermatology (usually not annualy - last visit was October or November  2022). I reviewed risk for non-melanoma skin cancer and importance of yearly skin exams while taking TNF inhibitor and especially important for her with past use of Enbrel and Simponi Aria. ? ?Humira approved through insurance .   Rx sent to:  [facility] (phone [phone]).  Patient provided with pharmacy phone number and copay card information and advised to call later this week to schedule shipment to home. ? ?Patient will continue Humira 40mg  SQ every 14 days. Patient will not restart methotrexate. LFTs normalized after discontinuation of medication therefore elevated LFTs likely  MTX-induced ?All questions encouraged and answered.  Instructed patient to call with any further questions or concerns. ? ?Knox Saliva, PharmD, MPH, BCPS ?Clinical Pharmacist (Rheumatology and Pulmonology) ? ?04/17/2021 9:31 AM ?

## 2021-04-17 NOTE — Patient Instructions (Addendum)
Your next HUMIRA dose is due on 05/01/21, 05/15/21, and every 14 days thereafter ? ?DISCONTINUE methotrexate. Your liver enzymes normalized after stopping it, so it was likely the medication causing this abnormality. ? ?HOLD HUMIRA if you have signs or symptoms of an infection. You can resume once you feel better or back to your baseline. ?HOLD HUMIRA if you start antibiotics to treat an infection. ?HOLD HUMIRA around the time of surgery/procedures. Your surgeon will be able to provide recommendations on when to hold BEFORE and when you are cleared to [place]. ? ?Please ensure you have yearly skin checks while taking Humira. Please request that they fax over notes to our clinic from the last visit. ? ?Pharmacy information: ?Your prescription will be shipped from [facility]. ?Their phone number is [phone] ?Please call to schedule shipment and confirm address. They will mail your medication to your home. ? ?Cost information: ?Your copay should be affordable. If you call the pharmacy and it is not affordable, please double-check that they are billing through your copay card as secondary coverage. ?That copay card information is: ?ID: [id number] ?Rx GROUP: [id number] ?Rx BIN: [id number] ?Rx PCN: [facility] ? ?Labs are due in 1 month then every 3 months. ?Lab hours are from Monday to Thursday 1:30-4:30pm and Friday 1:30-4pm. You do not need an appointment if you come for labs during these times. ? ?How to manage an injection site reaction: ?Remember the 5 C's: ?COUNTER - leave on the counter at least 30 minutes but up to overnight to bring medication to room temperature. This may help prevent stinging ?COLD - place something cold (like an ice gel pack or cold water bottle) on the injection site just before cleansing with alcohol. This may help reduce pain ?CLARITIN - use Claritin (generic name is loratadine) for the first two weeks of treatment or the day of, the day before, and the day after injecting. This  will help to minimize injection site reactions ?CORTISONE CREAM - apply if injection site is irritated and itching ?CALL ME - if injection site reaction is bigger than the size of your fist, looks infected, blisters, or if you develop hives ? ?

## 2021-04-22 ENCOUNTER — Other Ambulatory Visit: Payer: Self-pay

## 2021-04-22 ENCOUNTER — Encounter: Payer: Self-pay | Admitting: Occupational Therapy

## 2021-04-22 ENCOUNTER — Ambulatory Visit: Payer: 59 | Attending: Physical Medicine and Rehabilitation | Admitting: Occupational Therapy

## 2021-04-22 DIAGNOSIS — M25641 Stiffness of right hand, not elsewhere classified: Secondary | ICD-10-CM | POA: Diagnosis present

## 2021-04-22 DIAGNOSIS — M6281 Muscle weakness (generalized): Secondary | ICD-10-CM | POA: Insufficient documentation

## 2021-04-22 DIAGNOSIS — M25542 Pain in joints of left hand: Secondary | ICD-10-CM | POA: Insufficient documentation

## 2021-04-22 DIAGNOSIS — R278 Other lack of coordination: Secondary | ICD-10-CM | POA: Insufficient documentation

## 2021-04-22 DIAGNOSIS — M25541 Pain in joints of right hand: Secondary | ICD-10-CM | POA: Insufficient documentation

## 2021-04-22 DIAGNOSIS — M25642 Stiffness of left hand, not elsewhere classified: Secondary | ICD-10-CM | POA: Diagnosis present

## 2021-04-22 NOTE — Therapy (Signed)
[facility] ?[place], [place], [address] ?Phone: [phone]   Fax:  [phone] ? ?Occupational Therapy Treatment ? ?Patient Details  ?Name: Heidi Chapman ?MRN: [id number] ?Date of Birth: 07-06-1955 ?Referring Provider (OT): Courtney Heys, MD ([place] covering RA) ? ? ?Encounter Date: 04/22/2021 ? ? OT End of Session - 04/22/21 1012   ? ? Visit Number 3   ? Number of Visits 5   ? Date for OT Re-Evaluation 05/20/21   ? Authorization Type [employer]   ? Authorization Time Period unknown   ? OT Start Time 1009   ? OT Stop Time 1050   ? OT Time Calculation (min) 41 min   ? Activity Tolerance Patient tolerated treatment well   ? Behavior During Therapy [facility] for tasks assessed/performed   ? ?  ?  ? ?  ? ? ?Past Medical History:  ?Diagnosis Date  ? Anxiety   ? Arthritis   ? Fibromyalgia   ? GERD (gastroesophageal reflux disease)   ? Migraine   ? Polyarthralgia 01/26/2019  ? Screening for colon cancer 06/28/2020  ? Snoring 08/29/2020  ? ? ?Past Surgical History:  ?Procedure Laterality Date  ? CESAREAN SECTION    ? CHOLECYSTECTOMY N/A 10/24/2017  ? Procedure: LAPAROSCOPIC CHOLECYSTECTOMY WITH INTRAOPERATIVE CHOLANGIOGRAM;  Surgeon: Erroll Luna, MD;  Location: [facility];  Service: General;  Laterality: N/A;  ? GALLBLADDER SURGERY    ? TUBAL LIGATION    ? ? ?There were no vitals filed for this visit. ? ? Subjective Assessment - 04/22/21 1009   ? ? Subjective  "we are at about a 4"   ? Pertinent History RA, Osteoarthritis (neck and back), prediabetes   ? Currently in Pain? Yes   ? Pain Score 4    ? Pain Location Hand   ? Pain Orientation Left;Right   ? Pain Descriptors / Indicators Aching;Sore   ? Pain Type Chronic pain   ? Pain Onset More than a month ago   ? Pain Frequency Constant   ? ?  ?  ? ?  ? ? ? ? ? ? ? ? ? ? ? ? ? ? ? OT Treatments/Exercises (OP) - 04/22/21 1014   ? ?  ? Hand Exercises  ? Other Hand Exercises gentle AROM for BUE hand and fingers  and radial deviation for continuing gentle movement of BUE without increased inflammation.   ?  ? Modalities  ? Modalities Paraffin   reviewed energy conservation strategies and educated on contrast bath during paraffin  ?  ? RUE Paraffin  ? Number Minutes Paraffin 10 Minutes   ? RUE Paraffin Location Hand   ? Comments simultaneously with LUE   ?  ? LUE Paraffin  ? Number Minutes Paraffin 10 Minutes   ? LUE Paraffin Location Hand   ? Comments simultaneously with RUE   ? ?  ?  ? ?  ? ? ? ? ? ? ? ? ? OT Education - 04/22/21 1020   ? ? Education Details education on paraffin and contrast bath, education on rheumatoid arthritis   ? Person(s) Educated Patient   ? Methods Explanation;Handout   ? Comprehension Verbalized understanding   ? ?  ?  ? ?  ? ? ? ? ? ? OT Long Term Goals - 04/22/21 1013   ? ?  ? OT LONG TERM GOAL #1  ? Title Pt will be independent with HEP   ? Time 6   ?  Period Weeks   ? Status On-going   ? Target Date 05/20/21   ?  ? OT LONG TERM GOAL #2  ? Title Pt will verbalize understanding of joint protection strategies   ? Time 6   ? Period Weeks   ? Status Achieved   ?  ? OT LONG TERM GOAL #3  ? Title Pt will verbalize understanding of adapted strategies and/or equipment for increasing independence and decreasing pain with ADLs and IADLs. (cooking, zippers, folding laundry, etc)   ? Time 6   ? Period Weeks   ? Status On-going   ?  ? OT LONG TERM GOAL #4  ? Title Pt will verbalize and demonstrate understanding of any splint wear and care instructions PRN   ? Time 6   ? Period Weeks   ? Status On-going   ?  ? OT LONG TERM GOAL #5  ? Title Pt will verbalize understanding of energy conservation strategies   ? Time 6   ? Period Weeks   ? Status Achieved   ? ?  ?  ? ?  ? ? ? ? ? ? ? ? Plan - 04/22/21 1050   ? ? Clinical Impression Statement Pt is appreciative of education re: adapted strategies, pain management and energy conservation.   ? OT Occupational Profile and History Problem Focused Assessment -  Including review of records relating to presenting problem   ? Occupational performance deficits (Please refer to evaluation for details): ADL's;IADL's;Leisure   ? Body Structure / Function / Physical Skills Strength;ADL;Decreased knowledge of use of DME;Coordination;IADL;ROM;UE functional use;Pain;Dexterity;GMC;Flexibility;FMC   ? Rehab Potential Good   ? Clinical Decision Making Limited treatment options, no task modification necessary   ? Comorbidities Affecting Occupational Performance: None   ? Modification or Assistance to Complete Evaluation  No modification of tasks or assist necessary to complete eval   ? OT Frequency 1x / week   ? OT Duration 6 weeks   4 visits over 6 weeks d/t any scheduling conflicts  ? OT Treatment/Interventions Self-care/ADL training;Moist Heat;Fluidtherapy;DME and/or AE instruction;Splinting;Therapeutic activities;Contrast Bath;Ultrasound;Therapeutic exercise;Passive range of motion;Paraffin;Energy conservation;Manual Therapy;Patient/family education   ? Plan paraffin - see how liked, see if tried contrast bath, possibly d/c next session or after scheduled visits   ? ?  ?  ? ?  ? ? ?Patient will benefit from skilled therapeutic intervention in order to improve the following deficits and impairments:   ?Body Structure / Function / Physical Skills: Strength, ADL, Decreased knowledge of use of DME, Coordination, IADL, ROM, UE functional use, Pain, Dexterity, GMC, Flexibility, FMC ?  ?  ? ? ?Visit Diagnosis: ?Other lack of coordination ? ?Stiffness of left hand, not elsewhere classified ? ?Muscle weakness (generalized) ? ?Stiffness of right hand, not elsewhere classified ? ?Pain in joint of right hand ? ?Pain in joint of left hand ? ? ? ?Problem List ?Patient Active Problem List  ? Diagnosis Date Noted  ? Chronic bilateral back pain 03/19/2021  ? Other idiopathic scoliosis, thoracolumbar region 03/19/2021  ? Lumbar radiculopathy 03/19/2021  ? High risk medication use 03/13/2021  ? Neck  pain 03/13/2021  ? Overweight 03/13/2021  ? Body mass index (BMI) 30.0-30.9, adult 03/13/2021  ? Acquired deformity of right hand 03/13/2021  ? Lymphadenopathy 03/13/2021  ? Osteoarthritis 11/18/2020  ? Orthopnea 08/29/2020  ? Shortness of breath at rest 08/29/2020  ? Obstructive sleep apnea 08/29/2020  ? Fatigue 06/28/2020  ? Shortness of breath 06/28/2020  ? Screening mammogram for breast cancer  06/28/2020  ? Hyperlipidemia 06/28/2020  ? Prediabetes 06/28/2020  ? Tobacco use disorder 06/28/2020  ? Sinusitis, acute, maxillary 11/15/2019  ? CAP (community acquired pneumonia) 11/01/2019  ? Rheumatoid arthritis involving multiple sites with positive rheumatoid factor ([place]) 03/13/2019  ? Acute left-sided low back pain without sciatica 03/13/2019  ? Anxiety 01/26/2019  ? Healthcare maintenance 12/06/2017  ? Acalculous cholecystitis 10/23/2017  ? Benign neoplasm of colon 12/14/2011  ? Absence of both cervix and uterus, acquired 12/14/2011  ? ? ?Zachery Conch, OT ?04/22/2021, 10:52 AM ? ?[facility] ?[place], [place], [address] ?Phone: [phone]   Fax:  [phone] ? ?Name: Karessa Onorato Gioffre ?MRN: [id number] ?Date of Birth: 14-May-1955 ? ?

## 2021-04-22 NOTE — Patient Instructions (Addendum)
Contrast Bath ? ?Prepare the baths.  Cold = 55-65 degrees ?    Hot = 105-110 degrees ? ?Starting with the hot, dip hand or foot all the way into the water and hold there for selected duration.  Preferably 3 minutes. ? ?After selected duration is up, dip hand or foot into the cold for 1/3 duration of the hot. (3 minutes hot, 1 minute cold) ? ?Alternate back and forth for the times indicated for no more than a total of 20 minutes ending with hot. ? ?Rheumatoid Arthritis ?Rheumatoid arthritis (RA) is a long-term (chronic) disease that causes inflammation in your joints. RA may start slowly. It most often affects the small joints of the hands and feet. Usually, the same joints are affected on both sides of your body. Inflammation from RA can also affect other parts of your body, including your heart, eyes, or lungs. ?There is no cure for RA, but medicines can help your symptoms and halt or slow down the progression of the disease. ?What are the causes? ?RA is an autoimmune disease. When you have an autoimmune disease, your body's defense system (immune system) mistakenly attacks healthy body tissues. The exact cause of RA is not known. ?What increases the risk? ?You are more likely to develop this condition if you: ?Are a woman. ?Have a family history of RA or other autoimmune diseases. ?Have a history of smoking. ?Are obese. ?Have been exposed to pollutants or chemicals. ?What are the signs or symptoms? ?The first symptom of this condition may be morning stiffness that lasts longer than 30 minutes. ?Symptoms usually start gradually. They are often worse in the morning. ?As RA progresses, symptoms may include: ?Pain, stiffness, swelling, warmth, and tenderness in joints on both sides of your body. ?Loss of energy. ?Loss of appetite. ?Weight loss. ?Low-grade fever. ?Dry eyes and dry mouth. ?Firm lumps (rheumatoid nodules) that grow beneath your skin in areas such as your forearm bones near your elbows and on your  hands. ?Changes in the appearance of joints (deformity) and loss of joint function. ?Symptoms of this condition vary from person to person. ?Symptoms of RA often come and go. ?Sometimes, symptoms get worse for a period of time. These are called flares. ?How is this diagnosed? ?This condition is diagnosed based on your symptoms, medical history, and physical exam. ?You may have X-rays or an MRI to check for the type of joint changes that are caused by RA. ?You may also have blood tests to look for: ?Proteins (antibodies) that your immune system may make if you have RA. These include rheumatoid factor (RF) and anti-CCP. ?When blood tests show these proteins, you are said to have "seropositive RA." ?When blood tests do not show these proteins, you may have "seronegative RA." ?Inflammation in your blood. ?A low number of red blood cells (anemia). ?How is this treated? ?The goals of treatment are to relieve pain, reduce inflammation, and slow down or stop joint damage and disability. Treatment may include: ?Lifestyle changes. It is important to rest as needed, eat a healthy diet, and exercise. ?Medicines. Your health care provider may adjust your medicines every 3 months until treatment goals are reached. Common medicines include: ?Pain relievers (analgesics). ?Corticosteroids and NSAIDs to reduce inflammation. ?Disease-modifying antirheumatic drugs (DMARDs) to try to slow the course of the disease. ?Biologic response modifiers to reduce inflammation and damage. ?Physical therapy and occupational therapy. ?Surgery, if you have severe joint damage. Joint replacement or fusing of joints may be needed. ?Your health care  provider will work with you to identify the best treatment option for you based on assessment of the overall disease activity in your body. ?Follow these instructions at home: ?Activity ?Return to your normal activities as told by your health care provider. Ask your health care provider what activities are  safe for you. ?Rest when you are having a flare. ?Start an exercise program as told by your health care provider. ?General instructions ?Keep all follow-up visits as told by your health care provider. This is important. ?Take over-the-counter and prescription medicines only as told by your health care provider. ?Where to find more information ?[employer]: www.rheumatology.org ?Arthritis Foundation: www.arthritis.org ?Contact a health care provider if: ?You have a flare-up of RA symptoms. ?You have a fever. ?You have side effects from your medicines. ?Get help right away if: ?You have chest pain. ?You have trouble breathing. ?You quickly develop a hot, painful joint that is more severe than your usual joint aches. ?Summary ?Rheumatoid arthritis (RA) is a long-term (chronic) disease that causes inflammation in your joints. ?RA is an autoimmune disease. ?The goals of treatment are to relieve pain, reduce inflammation, and slow down or stop joint damage and disability. ?This information is not intended to replace advice given to you by your health care provider. Make sure you discuss any questions you have with your health care provider. ?Document Revised: 08/16/2018 Document Reviewed: 10/05/2017 ?Elsevier Patient Education ? [place]. ? ?

## 2021-04-29 ENCOUNTER — Encounter: Payer: Self-pay | Admitting: Occupational Therapy

## 2021-04-29 ENCOUNTER — Ambulatory Visit: Payer: 59 | Admitting: Occupational Therapy

## 2021-04-29 ENCOUNTER — Other Ambulatory Visit: Payer: Self-pay

## 2021-04-29 DIAGNOSIS — M25542 Pain in joints of left hand: Secondary | ICD-10-CM

## 2021-04-29 DIAGNOSIS — R278 Other lack of coordination: Secondary | ICD-10-CM

## 2021-04-29 DIAGNOSIS — M6281 Muscle weakness (generalized): Secondary | ICD-10-CM

## 2021-04-29 DIAGNOSIS — M25541 Pain in joints of right hand: Secondary | ICD-10-CM

## 2021-04-29 DIAGNOSIS — M25641 Stiffness of right hand, not elsewhere classified: Secondary | ICD-10-CM

## 2021-04-29 DIAGNOSIS — M25642 Stiffness of left hand, not elsewhere classified: Secondary | ICD-10-CM

## 2021-04-29 NOTE — Therapy (Signed)
[facility] ?[place] ?[place], [place], [address] ?Phone: [phone]   Fax:  [phone] ? ?Occupational Therapy Treatment & Discharge ? ?Patient Details  ?Name: Heidi Chapman ?MRN: [id number] ?Date of Birth: 06/29/1955 ?Referring Provider (OT): Courtney Heys, MD ([facility]) ? ? ?Encounter Date: 04/29/2021 ? ? OT End of Session - 04/29/21 1017   ? ? Visit Number 4   ? Number of Visits 5   ? Date for OT Re-Evaluation 05/20/21   ? Authorization Type [facility]   ? Authorization Time Period unknown   ? OT Start Time 1015   ? OT Stop Time 1045   d/c session  ? OT Time Calculation (min) 30 min   ? Activity Tolerance Patient tolerated treatment well   ? Behavior During Therapy [facility] for tasks assessed/performed   ? ?  ?  ? ?  ? ? ?Past Medical History:  ?Diagnosis Date  ? Anxiety   ? Arthritis   ? Fibromyalgia   ? GERD (gastroesophageal reflux disease)   ? Migraine   ? Polyarthralgia 01/26/2019  ? Screening for colon cancer 06/28/2020  ? Snoring 08/29/2020  ? ? ?Past Surgical History:  ?Procedure Laterality Date  ? CESAREAN SECTION    ? CHOLECYSTECTOMY N/A 10/24/2017  ? Procedure: LAPAROSCOPIC CHOLECYSTECTOMY WITH INTRAOPERATIVE CHOLANGIOGRAM;  Surgeon: Erroll Luna, MD;  Location: [facility];  Service: General;  Laterality: N/A;  ? GALLBLADDER SURGERY    ? TUBAL LIGATION    ? ? ?There were no vitals filed for this visit. ? ? Subjective Assessment - 04/29/21 1017   ? ? Subjective  "We got a lively bunch here today" Pt reports feeling "achy"   ? Pertinent History RA, Osteoarthritis (neck and back), prediabetes   ? Currently in Pain? Yes   ? Pain Score 4    ? Pain Location Hand   ? Pain Orientation Right;Left   ? Pain Descriptors / Indicators Aching;Sore   ? Pain Type Chronic pain   ? Pain Onset More than a month ago   ? Pain Frequency Constant   ? ?  ?  ? ?  ? ? ? ?OCCUPATIONAL THERAPY DISCHARGE SUMMARY ? ?Visits from Start of Care: 4 ? ?Current  functional level related to goals / functional outcomes: ?Pt progressed with adapting and adopting strategies and tools for energy conservation, joint protection strategies and pain management.  ?  ?Remaining deficits: ?Pt continues to deal with symptoms associated with chronic conditions: RA and OA  ?  ?Education / Equipment: ?Education for [employer], joint protection strategies and  pain management strategies ? ?Patient agrees to discharge. Patient goals were met. Patient is being discharged due to meeting the stated rehab goals.. ? ? ? ? ? ? ? ? ? ? ? ? ? ? OT Treatments/Exercises (OP) - 04/29/21 0001   ? ?  ? Modalities  ? Modalities Fluidotherapy   ?  ? RUE Fluidotherapy  ? Number Minutes Fluidotherapy 12 Minutes   ? RUE Fluidotherapy Location Hand   ? Comments simultaneously with LUE   ?  ? LUE Fluidotherapy  ? Number Minutes Fluidotherapy 12 Minutes   ? LUE Fluidotherapy Location Hand   ? Comments simultaneously with RUE   ? ?  ?  ? ?  ? ? ? ? ? ? ? ? ? OT Education - 04/29/21 1042   ? ? Education Details reviewed education and strategies   ? Person(s) Educated Patient   ? Methods  Explanation   ? Comprehension Verbalized understanding   ? ?  ?  ? ?  ? ? ? ? ? ? OT Long Term Goals - 04/29/21 1034   ? ?  ? OT LONG TERM GOAL #1  ? Title Pt will be independent with HEP   ? Time 6   ? Period Weeks   ? Status Achieved   ? Target Date 05/20/21   ?  ? OT LONG TERM GOAL #2  ? Title Pt will verbalize understanding of joint protection strategies   ? Time 6   ? Period Weeks   ? Status Achieved   ?  ? OT LONG TERM GOAL #3  ? Title Pt will verbalize understanding of adapted strategies and/or equipment for increasing independence and decreasing pain with ADLs and IADLs. (cooking, zippers, folding laundry, etc)   ? Time 6   ? Period Weeks   ? Status Achieved   ?  ? OT LONG TERM GOAL #4  ? Title Pt will verbalize and demonstrate understanding of any splint wear and care instructions PRN   ? Time 6   ? Period Weeks    ? Status Achieved   no splints or braces necessary at this time  ?  ? OT LONG TERM GOAL #5  ? Title Pt will verbalize understanding of energy conservation strategies   ? Time 6   ? Period Weeks   ? Status Achieved   ? ?  ?  ? ?  ? ? ? ? ? ? ? ? Plan - 04/29/21 1026   ? ? Clinical Impression Statement Pt has met all goals and is progressing with use of strategies and education for energy conservation and joint protection strategies.   ? OT Occupational Profile and History Problem Focused Assessment - Including review of records relating to presenting problem   ? Occupational performance deficits (Please refer to evaluation for details): ADL's;IADL's;Leisure   ? Body Structure / Function / Physical Skills Strength;ADL;Decreased knowledge of use of DME;Coordination;IADL;ROM;UE functional use;Pain;Dexterity;GMC;Flexibility;FMC   ? Rehab Potential Good   ? Clinical Decision Making Limited treatment options, no task modification necessary   ? Comorbidities Affecting Occupational Performance: None   ? Modification or Assistance to Complete Evaluation  No modification of tasks or assist necessary to complete eval   ? OT Frequency 1x / week   ? OT Duration 6 weeks   4 visits over 6 weeks d/t any scheduling conflicts  ? OT Treatment/Interventions Self-care/ADL training;Moist Heat;Fluidtherapy;DME and/or AE instruction;Splinting;Therapeutic activities;Contrast Bath;Ultrasound;Therapeutic exercise;Passive range of motion;Paraffin;Energy conservation;Manual Therapy;Patient/family education   ? Plan d/c session   ? ?  ?  ? ?  ? ? ?Patient will benefit from skilled therapeutic intervention in order to improve the following deficits and impairments:   ?Body Structure / Function / Physical Skills: Strength, ADL, Decreased knowledge of use of DME, Coordination, IADL, ROM, UE functional use, Pain, Dexterity, GMC, Flexibility, FMC ?  ?  ? ? ?Visit Diagnosis: ?Other lack of coordination ? ?Muscle weakness (generalized) ? ?Stiffness of  left hand, not elsewhere classified ? ?Stiffness of right hand, not elsewhere classified ? ?Pain in joint of right hand ? ?Pain in joint of left hand ? ? ? ?Problem List ?Patient Active Problem List  ? Diagnosis Date Noted  ? Chronic bilateral back pain 03/19/2021  ? Other idiopathic scoliosis, thoracolumbar region 03/19/2021  ? Lumbar radiculopathy 03/19/2021  ? High risk medication use 03/13/2021  ? Neck pain 03/13/2021  ? Overweight 03/13/2021  ?  Body mass index (BMI) 30.0-30.9, adult 03/13/2021  ? Acquired deformity of right hand 03/13/2021  ? Lymphadenopathy 03/13/2021  ? Osteoarthritis 11/18/2020  ? Orthopnea 08/29/2020  ? Shortness of breath at rest 08/29/2020  ? Obstructive sleep apnea 08/29/2020  ? Fatigue 06/28/2020  ? Shortness of breath 06/28/2020  ? Screening mammogram for breast cancer 06/28/2020  ? Hyperlipidemia 06/28/2020  ? Prediabetes 06/28/2020  ? Tobacco use disorder 06/28/2020  ? Sinusitis, acute, maxillary 11/15/2019  ? CAP (community acquired pneumonia) 11/01/2019  ? Rheumatoid arthritis involving multiple sites with positive rheumatoid factor ([place]) 03/13/2019  ? Acute left-sided low back pain without sciatica 03/13/2019  ? Anxiety 01/26/2019  ? Healthcare maintenance 12/06/2017  ? Acalculous cholecystitis 10/23/2017  ? Benign neoplasm of colon 12/14/2011  ? Absence of both cervix and uterus, acquired 12/14/2011  ? ? ?Zachery Conch, OT ?04/29/2021, 10:42 AM ? ?[facility] ?[place] ?[place], [place], [address] ?Phone: [phone]   Fax:  [phone] ? ?Name: Heidi Chapman ?MRN: [id number] ?Date of Birth: [birth year] ? ?

## 2021-05-06 ENCOUNTER — Ambulatory Visit: Payer: 59 | Admitting: Occupational Therapy

## 2021-05-14 ENCOUNTER — Ambulatory Visit: Payer: Self-pay | Admitting: Internal Medicine

## 2021-05-20 ENCOUNTER — Ambulatory Visit: Payer: Self-pay | Admitting: Internal Medicine

## 2021-05-20 ENCOUNTER — Ambulatory Visit (INDEPENDENT_AMBULATORY_CARE_PROVIDER_SITE_OTHER): Payer: 59 | Admitting: Internal Medicine

## 2021-05-20 ENCOUNTER — Encounter: Payer: Self-pay | Admitting: Internal Medicine

## 2021-05-20 VITALS — BP 117/78 | HR 89 | Temp 98.7°F | Ht 65.0 in | Wt 189.8 lb

## 2021-05-20 DIAGNOSIS — R7303 Prediabetes: Secondary | ICD-10-CM | POA: Diagnosis not present

## 2021-05-20 DIAGNOSIS — E785 Hyperlipidemia, unspecified: Secondary | ICD-10-CM

## 2021-05-20 DIAGNOSIS — F17200 Nicotine dependence, unspecified, uncomplicated: Secondary | ICD-10-CM

## 2021-05-20 DIAGNOSIS — G4733 Obstructive sleep apnea (adult) (pediatric): Secondary | ICD-10-CM

## 2021-05-20 DIAGNOSIS — F418 Other specified anxiety disorders: Secondary | ICD-10-CM

## 2021-05-20 LAB — POCT GLYCOSYLATED HEMOGLOBIN (HGB A1C): Hemoglobin A1C: 5.8 % — AB (ref 4.0–5.6)

## 2021-05-20 LAB — GLUCOSE, CAPILLARY: Glucose-Capillary: 115 mg/dL — ABNORMAL HIGH (ref 70–99)

## 2021-05-20 NOTE — Assessment & Plan Note (Signed)
Assessment: Last HbA1c 10 months ago noted to be 5.7%, with slight increase to 5.8% today which is still under prediabetic range.  ?Plan: Continue lifestyle modifications such as exercise and healthy dietary choices. ?

## 2021-05-20 NOTE — Assessment & Plan Note (Signed)
Assessment: PHQ-9 previously noted a score of 10. Patient is not currently on medical management for depression or anxiety. We talked in depth about what is currently stressful for her--the home she and her daughter and granddaughter rent is being sold and they are having to find a home to purchase which has been difficult and defeating for her daughter to whom she is a large source of emotional and mental support. She has also had a very eventful year health-wise but is hopeful now that she is being cared for closely by rheumatology and pain medicine, and she has noticed that with Cymbalta her chronic pain has lessened which has helped. She feels confident that she can get through this acute heightened stress phase and knows when depressive episodes are coming on. ?Plan: Discussed future referral to Dr. Theodis Shove if she decides that she would like talk therapy after all, which she is agreeable to. ?

## 2021-05-20 NOTE — Progress Notes (Signed)
? ?  CC: routine check up ? ?HPI: ? ?Heidi Chapman is a 66 y.o. female with past medical history as detailed below who presents for routine check-up today. Please see problem-based charting for detailed assessment and plan. ? ?Past Medical History:  ?Diagnosis Date  ? Absence of both cervix and uterus, acquired 12/14/2011  ? Acalculous cholecystitis 10/23/2017  ? Acquired deformity of right hand 03/13/2021  ? Acute left-sided low back pain without sciatica 03/13/2019  ? Anxiety   ? Anxiety 01/26/2019  ? Arthritis   ? Benign neoplasm of colon 12/14/2011  ? Fatigue 06/28/2020  ? Fibromyalgia   ? GERD (gastroesophageal reflux disease)   ? Healthcare maintenance 12/06/2017  ? High risk medication use 03/13/2021  ? Lymphadenopathy 03/13/2021  ? Migraine   ? Neck pain 03/13/2021  ? Orthopnea 08/29/2020  ? Overweight 03/13/2021  ? Polyarthralgia 01/26/2019  ? Screening for colon cancer 06/28/2020  ? Screening mammogram for breast cancer 06/28/2020  ? Shortness of breath at rest 08/29/2020  ? Snoring 08/29/2020  ? ?Review of Systems:   ?Review of Systems  ?Constitutional:  Positive for malaise/fatigue. Negative for weight loss.  ?Respiratory:  Positive for shortness of breath.   ?Cardiovascular:  Negative for leg swelling.  ?Gastrointestinal:  Negative for abdominal pain.  ?Musculoskeletal:  Positive for joint pain and myalgias. Negative for falls.  ?Neurological:  Positive for weakness. Negative for dizziness, tingling, sensory change and headaches.  ?Psychiatric/Behavioral:  Positive for depression. The patient is nervous/anxious.   ? ? ?Physical Exam: ? ?Vitals:  ? 05/20/21 1047  ?BP: 117/78  ?Pulse: 89  ?Temp: 98.7 ?F (37.1 ?C)  ?TempSrc: Oral  ?SpO2: 97%  ?Weight: 189 lb 12.8 oz (86.1 kg)  ?Height: '5\' 5"'$  (1.651 m)  ? ?Constitutional:Well-appearing female in no acute distress. ?Cardio:Regular rate and rhythm. No murmurs, rubs, gallops. ?Pulm:Clear to auscultation bilaterally. ?Abdomen:Soft, nontender, nondistended. ?EVO:[id number]  for extremity edema. ?Skin:Warm and dry. ?Neuro:Alert and oriented x3. No focal deficit noted. ?Psych:Pleasant mood and affect. ? ?Assessment & Plan:  ? ?See Encounters Tab for problem based charting. ? ?Patient discussed with Dr. Jimmye Norman ? ?

## 2021-05-20 NOTE — Assessment & Plan Note (Addendum)
Assessment: Most recent lipid panel noted to be in May 2022 and was remarkable for LDL 123, total cholesterol 211. ASCVD risk of 4.0% based on previous lipid panel results. She has quit smoking and has been tobacco-free for three months now.  ?Plan: Patient is at increased risk of adverse CV events given diagnosis of RA. She is not currently managed with medications for high cholesterol and has previously been counseled on lifestyle modifications to improve her cholesterol to a normal range. I will check a lipid panel today and, if she continues to have elevated cholesterol, will start a statin for primary prevention. ? ?ADDENDUM: Lipid panel revealed persistent elevation of LDL, 126 today. Overall lipid panel results are unchanged from prior and updated ASCVD risk score is 4.1%. Given her diagnosis of RA and increased adverse cardiovascular risk factor from this disease alone, I will initiate a low dose of atorvastatin at this time will follow-up labs at next visit. ?

## 2021-05-20 NOTE — Assessment & Plan Note (Signed)
Assessment: Patient reports continued use of CPAP and says that it has helped with the shortness of breath that she has been experiencing. ?Plan: Continue CPAP nightly. ?

## 2021-05-20 NOTE — Patient Instructions (Signed)
Heidi Chapman, ? ?It was a pleasure meeting and getting to know you today! I am glad to hear that you are now followed by pain specialists and rheumatology for your RA and chronic pain, and that these issues are gradually improving for you. ? ?I will let you know what the results of your labs checking cholesterol and diabetes are. Also, please let me know if you decide that you would like to talk to Dr. Theodis Shove for the stressors and anxiety-causing things that you have in your life. ? ?I would like you to follow-up in about 3 months so that we can make sure your health continues to do well. ? ?Remember: If you have any questions or concerns, please call our clinic at [phone] between 9am-5pm and after hours call [phone] and ask for the internal medicine resident on call. If you feel you are having a medical emergency please call 911. ? ?Farrel Gordon, DO ? ?

## 2021-05-20 NOTE — Assessment & Plan Note (Signed)
Assessment: Patient reports that she is now three months tobacco free. ?Plan: Congratulated patient for this huge success. ?

## 2021-05-21 ENCOUNTER — Telehealth: Payer: Self-pay | Admitting: Internal Medicine

## 2021-05-21 LAB — LIPID PANEL
Chol/HDL Ratio: 3.3 ratio (ref 0.0–4.4)
Cholesterol, Total: 214 mg/dL — ABNORMAL HIGH (ref 100–199)
HDL: 64 mg/dL (ref >39)
LDL Chol Calc (NIH): 126 mg/dL — ABNORMAL HIGH (ref 0–99)
Triglycerides: 137 mg/dL (ref 0–149)
VLDL Cholesterol Cal: 24 mg/dL (ref 5–40)

## 2021-05-21 MED ORDER — ATORVASTATIN CALCIUM 10 MG PO TABS: 10.0000 mg | ORAL_TABLET | Freq: Every day | ORAL | 11 refills | Status: DC

## 2021-05-21 NOTE — Progress Notes (Signed)
Internal Medicine Clinic Attending ? ?Case discussed with Dr. Dean  At the time of the visit.  We reviewed the resident?s history and exam and pertinent patient test results.  I agree with the assessment, diagnosis, and plan of care documented in the resident?s note.  ?

## 2021-05-21 NOTE — Telephone Encounter (Signed)
Attempted to call patient regarding lab results from yesterday's visit and left a voicemail letting her know that this was the purpose of my call and that I would leave my update on MyChart for her, and that she could call back with any questions or send me a message over [place]. ? ?Dr. Marlou Sa ?

## 2021-05-21 NOTE — Addendum Note (Signed)
Addended by: Renato Battles on: 05/21/2021 11:19 AM ? ? Modules accepted: Orders ? ?

## 2021-05-28 DIAGNOSIS — Z0271 Encounter for disability determination: Secondary | ICD-10-CM

## 2021-06-03 ENCOUNTER — Encounter: Payer: Self-pay | Admitting: Adult Health

## 2021-06-03 ENCOUNTER — Ambulatory Visit: Payer: 59 | Admitting: Adult Health

## 2021-06-03 VITALS — BP 129/80 | HR 90 | Ht 65.0 in | Wt 189.0 lb

## 2021-06-03 DIAGNOSIS — G4719 Other hypersomnia: Secondary | ICD-10-CM

## 2021-06-03 DIAGNOSIS — Z9989 Dependence on other enabling machines and devices: Secondary | ICD-10-CM | POA: Diagnosis not present

## 2021-06-03 DIAGNOSIS — G4733 Obstructive sleep apnea (adult) (pediatric): Secondary | ICD-10-CM

## 2021-06-03 NOTE — Progress Notes (Signed)
? ?Office Visit Note ? ?Patient: Heidi Chapman             ?Date of Birth: 12/06/1955           ?MRN: [id number]             ?PCP: Farrel Gordon, DO ?Referring: Farrel Gordon, DO ?Visit Date: 06/04/2021 ? ? ?Subjective:  ?Follow-up (Total body pain) ? ? ?History of Present Illness: Heidi Chapman is a 66 y.o. female here for follow up for seropositive RA on Humira 40 mg [place] q14days started last month. Previous MTX treatment was not resumed due to abnormal baseline LFTs. She felt improvement after about 3 days on prednisone taper. Inflammation remains better but came back somewhat, but no large swollen nodules on the neck recently. Duloxetine is helping her chronic pain overall but still hurts with any activity more than an hour. She followed up with her neurology for the sleep apnea and fatigue still very fatigued limiting her activity. ? ?Previous HPI ?03/13/21 ?[name] is a 66 y.o. female here for seropositive rheumatoid arthritis which has been treated with methotrexate 17.5 mg [place] weekly and simponi aria infusion and taking folic acid 3 mg daily. In addition to RA she suffers pain with OA including lumbar spine DJD and fibromyalgia pain treated with naproxen 500 mg PRN and flexeril.  She reports joint pain started since many years ago particularly with progressive pain and stiffness and then lateral deviation worsening in MCP joints of both hands.  She also had chronic osteoarthritis particularly in her back with sciatica and back pain at multiple levels.  She finally went for evaluation of the hand pain and deformities since about 3 years ago was diagnosed with seropositive rheumatoid arthritis.  Initial treatments tried include leflunomide which was not tolerated and Enbrel which did not have clinical response.  Oral methotrexate was not tolerable she had some degree of nausea with the subcutaneous methotrexate as well but not as bad.  While on treatment she felt disease control was very good mostly just  had the chronic OA type symptoms not much swelling and no progression of deformities. ?She has been a patient with Dr. Trudie Reed needing to transfer care due to change in insurance status.  She is now off all of her medications since about a month ago and is noticing worsening joint pains again.  Particularly since 2 weeks ago she has much worse neck pain is developed lymph node swelling on the back of her skull and neck.  She went to the emergency department for this were no specific problems were found liver function test showed a mild elevation she was prescribed Keflex empirically for possible infection has not noticed any difference since taking this medicine. Besides joint pain and lymph node swelling she feels extremely fatigued overall. ?  ?DMARD Hx ?Enbrel primary nonresponder ?Simponi aria TOC ?Methotrexate [place] TOC ?Methotrexate PO GI intolerance ?Leflunomide not tolerated ?  ?Labs reviewed ?02/2021 ?ESR 26 ?CBC wnl ?CMP AST 43 ALT 56 Alk phos 149 ?  ?03/2019 ?TB neg ?HBV neg ?HCV neg ?  ?01/2019 ?RA 109 ?CCP >250 ?  ?10/2017 ?HIV neg ? ? ?Review of Systems  ?Constitutional:  Positive for fatigue.  ?HENT:  Positive for mouth dryness.   ?Eyes:  Negative for dryness.  ?Respiratory:  Negative for shortness of breath.   ?Cardiovascular:  Negative for swelling in legs/feet.  ?Gastrointestinal:  Negative for constipation.  ?Endocrine: Positive for excessive thirst.  ?Genitourinary:  Negative for  difficulty urinating.  ?Musculoskeletal:  Positive for joint pain, gait problem, joint pain, joint swelling, muscle weakness and muscle tenderness.  ?Skin:  Negative for rash.  ?Allergic/Immunologic: Positive for susceptible to infections.  ?Neurological:  Positive for numbness and weakness.  ?Hematological:  Positive for bruising/bleeding tendency.  ?Psychiatric/Behavioral:  Positive for sleep disturbance.   ? ?PMFS History:  ?Patient Active Problem List  ? Diagnosis Date Noted  ? Fibromyalgia 06/04/2021  ? Depression with  anxiety 05/20/2021  ? Chronic bilateral back pain 03/19/2021  ? Other idiopathic scoliosis, thoracolumbar region 03/19/2021  ? Lumbar radiculopathy 03/19/2021  ? High risk medication use 03/13/2021  ? Body mass index (BMI) 30.0-30.9, adult 03/13/2021  ? Osteoarthritis 11/18/2020  ? Obstructive sleep apnea 08/29/2020  ? Shortness of breath 06/28/2020  ? Hyperlipidemia 06/28/2020  ? Prediabetes 06/28/2020  ? Rheumatoid arthritis involving multiple sites with positive rheumatoid factor ([place]) 03/13/2019  ?  ?Past Medical History:  ?Diagnosis Date  ? Absence of both cervix and uterus, acquired 12/14/2011  ? Acalculous cholecystitis 10/23/2017  ? Acquired deformity of right hand 03/13/2021  ? Acute left-sided low back pain without sciatica 03/13/2019  ? Anxiety   ? Anxiety 01/26/2019  ? Arthritis   ? Benign neoplasm of colon 12/14/2011  ? Fatigue 06/28/2020  ? Fibromyalgia   ? GERD (gastroesophageal reflux disease)   ? Healthcare maintenance 12/06/2017  ? High risk medication use 03/13/2021  ? Lymphadenopathy 03/13/2021  ? Migraine   ? Neck pain 03/13/2021  ? Orthopnea 08/29/2020  ? Overweight 03/13/2021  ? Polyarthralgia 01/26/2019  ? Screening for colon cancer 06/28/2020  ? Screening mammogram for breast cancer 06/28/2020  ? Shortness of breath at rest 08/29/2020  ? Snoring 08/29/2020  ?  ?Family History  ?Problem Relation Age of Onset  ? Cancer - Lung Mother   ? Brain cancer Mother   ? Colon polyps Father   ? Prostate cancer Father   ? Diverticulitis Father   ? Heart attack Father   ? Cancer Maternal Grandmother   ? Cancer Maternal Grandfather   ? Cancer Paternal Grandfather   ? ?Past Surgical History:  ?Procedure Laterality Date  ? CESAREAN SECTION    ? CHOLECYSTECTOMY N/A 10/24/2017  ? Procedure: LAPAROSCOPIC CHOLECYSTECTOMY WITH INTRAOPERATIVE CHOLANGIOGRAM;  Surgeon: Erroll Luna, MD;  Location: [facility];  Service: General;  Laterality: N/A;  ? GALLBLADDER SURGERY    ? TUBAL LIGATION    ? ?Social History  ? ?Social History  Narrative  ? Lives with youngest daughter and granddaughter  ? Right handed  ? Caffeine: 1 cup of coffee and 1 soda daily  ? ?Immunization History  ?Administered Date(s) Administered  ? Influenza,inj,Quad PF,6+ Mos 01/26/2019, 11/18/2020  ? PFIZER(Purple Top)SARS-COV-2 Vaccination 05/13/2019, 06/07/2019, 10/30/2019  ?  ? ?Objective: ?Vital Signs: BP 126/82 (BP Location: Left Arm, Patient Position: Sitting, Cuff Size: Normal)   Pulse 97   Resp 16   Ht 5' 5"  (1.651 m)   Wt 187 lb (84.8 kg)   BMI 31.12 kg/m?   ? ?Physical Exam ?Cardiovascular:  ?   Rate and Rhythm: Normal rate and regular rhythm.  ?Pulmonary:  ?   Effort: Pulmonary effort is normal.  ?   Breath sounds: Normal breath sounds.  ?Musculoskeletal:  ?   Right lower leg: No edema.  ?   Left lower leg: No edema.  ?Skin: ?   General: Skin is warm and dry.  ?   Findings: No rash.  ?Neurological:  ?   Mental Status:  She is alert.  ?Psychiatric:     ?   Mood and Affect: Mood normal.  ?  ? ?Musculoskeletal Exam:  ?Neck full ROM some pain with lateral rotation to both sides ?Shoulders mild tenderness and on upper back bilaterally, no swelling ?Elbows full ROM no tenderness or swelling ?Wrists full ROM no tenderness or swelling ?Fingers mild swelling on right 2nd-3rd MCPs full ROM intact ?Knees full ROM no tenderness or swelling ?Right 1st MTP bunion no tenderness ? ? ?CDAI Exam: ?CDAI Score: 13  ?Patient Global: 40 mm; Provider Global: 30 mm ?Swollen: 2 ; Tender: 4  ?Joint Exam 06/04/2021  ? ?   Right  Left  ?Glenohumeral   Tender   Tender  ?MCP 2  Swollen Tender     ?MCP 3  Swollen Tender     ? ? ? ?Investigation: ?No additional findings. ? ?Imaging: ?No results found. ? ?Recent Labs: ?Lab Results  ?Component Value Date  ? WBC 6.9 06/04/2021  ? HGB 16.4 (H) 06/04/2021  ? PLT 262 06/04/2021  ? NA 142 06/04/2021  ? K 4.2 06/04/2021  ? CL 108 06/04/2021  ? CO2 23 06/04/2021  ? GLUCOSE 117 (H) 06/04/2021  ? BUN 13 06/04/2021  ? CREATININE 0.93 06/04/2021  ?  BILITOT 1.2 06/04/2021  ? ALKPHOS 149 (H) 03/10/2021  ? AST 29 06/04/2021  ? ALT 35 (H) 06/04/2021  ? PROT 7.3 06/04/2021  ? ALBUMIN 3.6 03/10/2021  ? CALCIUM 9.9 06/04/2021  ? GFRAA 84 01/26/2019  ? QFTBGOLDPLUS NEGATI

## 2021-06-03 NOTE — Progress Notes (Signed)
?[name] ?[address] ?[place]. [place] [address] ?[phone] ? ?     OFFICE FOLLOW UP NOTE ? ?[name] ?Date of Birth:  [birth year] ?Medical Record Number:  [id number]  ? ?Reason for visit: Initial CPAP follow-up ? ? ? ?SUBJECTIVE: ? ? ?CHIEF COMPLAINT:  ?Chief Complaint  ?Patient presents with  ? Obstructive Sleep Apnea  ?  Rm 3 alone ?Pt is well, no concerns with CPAP  ? ? ?HPI:  ? ?Update 06/03/2021 JM: Patient returns for initial CPAP follow-up visit.  Completed HST 10/07/2020 which showed moderate sleep apnea with calculated AHI 26.5/h, REM AHI 29.2/h, NREM AHI 26.3/h, supine AHI 19.3/h and non supine AHI 26.9/h.  No evidence of hypoxia.  Recommended auto CPAP with set up date 03/18/2021.  ? ?Reports occasional difficulty tolerating as she is chronically a restless sleeper but she has been noticing feeling more relaxed at times while using CPAP.  She believes her tolerance has been gradually improving.  Denies any improvement of energy levels or daytime fatigue but she believes this is due to to other health conditions including RA.  She has since completely stopped tobacco use.  Fatigue severity scale 61/63 (prior 57/63).  Epworth Sleepiness Scale 18/24 (prior 15/24). ? ? ? ? ? ? ? ?Initial consult visit 08/29/2020 Dr. Brett Fairy (provided for reference purposes only) ?Viana Sleep is a 66 y.o. year old White or Caucasian female patient . ?She is seen 08-29-2020 for a sleep apnea evaluation.  ?  ?Chief concern according to patient :  sleepiness and tiredness , ended up with pneumonia several times  and was finally diagnosed with rheumatoid arthritis. 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  ?  ?I have the pleasure of seeing Heidi Chapman today, a right-handed White or Caucasian female with a possible sleep disorder. She  has a past medical history of Anxiety, Arthritis, RF positive, ANA negative , GERD (gastroesophageal reflux  disease), and Migraine. ?  ?Sleep relevant medical history: sleepwalker in childhood age 66- ongoing to 73, concussion at age 66,TBI,  cervical spine DDD.  ?  ? Family medical /sleep history: No other family member on CPAP with OSA, insomnia, sleep walkers.  ?  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  ?Pets : 6 cats and 3 dogs are present. ?Tobacco use; 10 cig a day.   ?ETOH use ; seldomly ,  ?Caffeine intake in form of Coffee( 1-2 cups) Soda( 2) Tea ( /) or energy drinks. ?Regular exercise  limited.    ?  ?  ?  ?Sleep habits are as follows: The patient's dinner time is between  PM. The patient goes to bed at 12 PM and continues to sleep for 6 hours, wakes for 2 bathroom breaks, the first time at 2-3 AM.  The preferred sleep position is elevated for GERD- , with the support of 0 pillows. Dreams are reportedly frequent/vivid.  ?6.30 AM is the usual rise time. The patient wakes up with her dogs. Marland Kitchen  ?She reports not feeling refreshed or restored in AM, with symptoms such as dry mouth, rare morning headaches, and frequent residual fatigue. Naps are taken daily-frequently, lasting from 1-2 hours and are more refreshing than nocturnal sleep.  ? ? ? ? ? ?ROS:   ?14 system review of systems performed and negative with exception of those listed in HPI ? ?PMH:  ?  Past Medical History:  ?Diagnosis Date  ? Absence of both cervix and uterus, acquired 12/14/2011  ? Acalculous cholecystitis 10/23/2017  ? Acquired deformity of right hand 03/13/2021  ? Acute left-sided low back pain without sciatica 03/13/2019  ? Anxiety   ? Anxiety 01/26/2019  ? Arthritis   ? Benign neoplasm of colon 12/14/2011  ? Fatigue 06/28/2020  ? Fibromyalgia   ? GERD (gastroesophageal reflux disease)   ? Healthcare maintenance 12/06/2017  ? High risk medication use 03/13/2021  ? Lymphadenopathy 03/13/2021  ? Migraine   ? Neck pain 03/13/2021  ? Orthopnea 08/29/2020  ?  Overweight 03/13/2021  ? Polyarthralgia 01/26/2019  ? Screening for colon cancer 06/28/2020  ? Screening mammogram for breast cancer 06/28/2020  ? Shortness of breath at rest 08/29/2020  ? Snoring 08/29/2020  ? ? ?PSH:  ?Past Surgical History:  ?Procedure Laterality Date  ? CESAREAN SECTION    ? CHOLECYSTECTOMY N/A 10/24/2017  ? Procedure: LAPAROSCOPIC CHOLECYSTECTOMY WITH INTRAOPERATIVE CHOLANGIOGRAM;  Surgeon: Erroll Luna, MD;  Location: [facility];  Service: General;  Laterality: N/A;  ? GALLBLADDER SURGERY    ? TUBAL LIGATION    ? ? ?Social History:  ?Social History  ? ?Socioeconomic History  ? Marital status: Legally Separated  ?  Spouse name: Not on file  ? Number of children: Not on file  ? Years of education: Not on file  ? Highest education level: Not on file  ?Occupational History  ? Not on file  ?Tobacco Use  ? Smoking status: Former  ?  Packs/day: 0.50  ?  Types: Cigarettes  ?  Passive exposure: Past  ? Smokeless tobacco: Never  ? Tobacco comments:  ?  1/2 pk a day.  Wants Chantex  ?Vaping Use  ? Vaping Use: Never used  ?Substance and Sexual Activity  ? Alcohol use: Yes  ?  Comment: 1-3 drinks annually  ? Drug use: Never  ? Sexual activity: Not on file  ?Other Topics Concern  ? Not on file  ?Social History Narrative  ? Lives with youngest daughter and granddaughter  ? Right handed  ? Caffeine: 1 cup of coffee and 1 soda daily  ? ?Social Determinants of Health  ? ?Financial Resource Strain: Not on file  ?Food Insecurity: Not on file  ?Transportation Needs: Not on file  ?Physical Activity: Not on file  ?Stress: Not on file  ?Social Connections: Not on file  ?Intimate Partner Violence: Not on file  ? ? ?Family History:  ?Family History  ?Problem Relation Age of Onset  ? Cancer - Lung Mother   ? Brain cancer Mother   ? Colon polyps Father   ? Prostate cancer Father   ? Diverticulitis Father   ? Heart attack Father   ? Cancer Maternal Grandmother   ? Cancer Maternal Grandfather   ? Cancer Paternal Grandfather    ? ? ?Medications:   ?Current Outpatient Medications on File Prior to Visit  ?Medication Sig Dispense Refill  ? Adalimumab (HUMIRA PEN) 40 MG/0.4ML PNKT Inject 40 mg into the skin every 14 (fourteen) days. 1 kit - 2 pens 2 each 2  ? atorvastatin (LIPITOR) 10 MG tablet Take 1 tablet (10 mg total) by mouth daily. 30 tablet 11  ? cimetidine (TAGAMET) 200 MG tablet Take 200 mg by mouth as needed.    ? DULoxetine (CYMBALTA) 30 MG capsule Take 1 capsule (30 mg total) by mouth at bedtime. Take 30 mg nightly x 1 week, then 60 mg nightly- for back/nerve  pain 60 capsule 3  ? Multiple Vitamins-Minerals (MULTIVITAMIN WOMEN 50+) TABS See admin instructions.    ? naproxen (NAPROSYN) 500 MG tablet 1 tablet with food or milk as needed    ? promethazine (PHENERGAN) 12.5 MG tablet Take 1 tablet (12.5 mg total) by mouth every 6 (six) hours as needed for nausea or vomiting. 90 tablet 1  ? ?No current facility-administered medications on file prior to visit.  ? ? ?Allergies:   ?Allergies  ?Allergen Reactions  ? Leflunomide Other (See Comments)  ? Peanut Butter Flavor Other (See Comments)  ? Latex Rash  ? ? ? ? ?OBJECTIVE: ? ?Physical Exam ? ?Vitals:  ? 06/03/21 1056  ?BP: 129/80  ?Pulse: 90  ?Weight: 189 lb (85.7 kg)  ?Height: 5' 5"  (1.651 m)  ? ?Body mass index is 31.45 kg/m?Marland Kitchen ?No results found. ? ?General: well developed, well nourished, very pleasant middle-age Caucasian female, seated, in no evident distress ?Head: head normocephalic and atraumatic.   ?Neck: supple with no carotid or supraclavicular bruits ?Cardiovascular: regular rate and rhythm, no murmurs ?Musculoskeletal: no deformity ?Skin:  no rash/petichiae ?Vascular:  Normal pulses all extremities ?  ?Neurologic Exam ?Mental Status: Awake and fully alert. Oriented to place and time. Recent and remote memory intact. Attention span, concentration and fund of knowledge appropriate. Mood and affect appropriate.  ?Cranial Nerves: Pupils equal, briskly reactive to light.  Extraocular movements full without nystagmus. Visual fields full to confrontation. Hearing intact. Facial sensation intact. Face, tongue, palate moves normally and symmetrically.  ?Motor: Normal bulk and tone. Normal s

## 2021-06-04 ENCOUNTER — Encounter: Payer: Self-pay | Admitting: Internal Medicine

## 2021-06-04 ENCOUNTER — Ambulatory Visit: Payer: 59 | Admitting: Internal Medicine

## 2021-06-04 VITALS — BP 126/82 | HR 97 | Resp 16 | Ht 65.0 in | Wt 187.0 lb

## 2021-06-04 DIAGNOSIS — Z79899 Other long term (current) drug therapy: Secondary | ICD-10-CM

## 2021-06-04 DIAGNOSIS — M797 Fibromyalgia: Secondary | ICD-10-CM | POA: Diagnosis not present

## 2021-06-04 DIAGNOSIS — M0579 Rheumatoid arthritis with rheumatoid factor of multiple sites without organ or systems involvement: Secondary | ICD-10-CM

## 2021-06-05 LAB — CBC WITH DIFFERENTIAL/PLATELET
Absolute Monocytes: 683 cells/uL (ref 200–950)
Basophils Absolute: 90 cells/uL (ref 0–200)
Basophils Relative: 1.3 %
Eosinophils Absolute: 90 cells/uL (ref 15–500)
Eosinophils Relative: 1.3 %
HCT: 48.2 % — ABNORMAL HIGH (ref 35.0–45.0)
Hemoglobin: 16.4 g/dL — ABNORMAL HIGH (ref 11.7–15.5)
Lymphs Abs: 2767 cells/uL (ref 850–3900)
MCH: 30.8 pg (ref 27.0–33.0)
MCHC: 34 g/dL (ref 32.0–36.0)
MCV: 90.4 fL (ref 80.0–100.0)
MPV: 10.9 fL (ref 7.5–12.5)
Monocytes Relative: 9.9 %
Neutro Abs: 3271 cells/uL (ref 1500–7800)
Neutrophils Relative %: 47.4 %
Platelets: 262 10*3/uL (ref 140–400)
RBC: 5.33 10*6/uL — ABNORMAL HIGH (ref 3.80–5.10)
RDW: 12.8 % (ref 11.0–15.0)
Total Lymphocyte: 40.1 %
WBC: 6.9 10*3/uL (ref 3.8–10.8)

## 2021-06-05 LAB — COMPLETE METABOLIC PANEL WITH GFR
AG Ratio: 1.4 (calc) (ref 1.0–2.5)
ALT: 35 U/L — ABNORMAL HIGH (ref 6–29)
AST: 29 U/L (ref 10–35)
Albumin: 4.3 g/dL (ref 3.6–5.1)
Alkaline phosphatase (APISO): 127 U/L (ref 37–153)
BUN: 13 mg/dL (ref 7–25)
CO2: 23 mmol/L (ref 20–32)
Calcium: 9.9 mg/dL (ref 8.6–10.4)
Chloride: 108 mmol/L (ref 98–110)
Creat: 0.93 mg/dL (ref 0.50–1.05)
Globulin: 3 g/dL (calc) (ref 1.9–3.7)
Glucose, Bld: 117 mg/dL — ABNORMAL HIGH (ref 65–99)
Potassium: 4.2 mmol/L (ref 3.5–5.3)
Sodium: 142 mmol/L (ref 135–146)
Total Bilirubin: 1.2 mg/dL (ref 0.2–1.2)
Total Protein: 7.3 g/dL (ref 6.1–8.1)
eGFR: 69 mL/min/{1.73_m2} (ref >=60)

## 2021-06-05 LAB — SEDIMENTATION RATE: Sed Rate: 17 mm/h (ref 0–30)

## 2021-06-06 NOTE — Progress Notes (Signed)
Labs look okay her liver function test is very slightly abnormal but no problem for continuing Humira. Sed rate is improved compared to 2 before starting Humira so may be seeing a benefit.

## 2021-06-18 ENCOUNTER — Encounter: Payer: Self-pay | Admitting: Physical Medicine and Rehabilitation

## 2021-06-18 ENCOUNTER — Encounter: Payer: 59 | Attending: Physical Medicine and Rehabilitation | Admitting: Physical Medicine and Rehabilitation

## 2021-06-18 VITALS — BP 142/75 | HR 84 | Ht 65.0 in | Wt 190.0 lb

## 2021-06-18 DIAGNOSIS — M0579 Rheumatoid arthritis with rheumatoid factor of multiple sites without organ or systems involvement: Secondary | ICD-10-CM | POA: Diagnosis not present

## 2021-06-18 DIAGNOSIS — R5382 Chronic fatigue, unspecified: Secondary | ICD-10-CM | POA: Insufficient documentation

## 2021-06-18 DIAGNOSIS — Z5181 Encounter for therapeutic drug level monitoring: Secondary | ICD-10-CM | POA: Diagnosis not present

## 2021-06-18 DIAGNOSIS — M5416 Radiculopathy, lumbar region: Secondary | ICD-10-CM | POA: Insufficient documentation

## 2021-06-18 DIAGNOSIS — Z79891 Long term (current) use of opiate analgesic: Secondary | ICD-10-CM | POA: Insufficient documentation

## 2021-06-18 DIAGNOSIS — M797 Fibromyalgia: Secondary | ICD-10-CM | POA: Insufficient documentation

## 2021-06-18 DIAGNOSIS — G894 Chronic pain syndrome: Secondary | ICD-10-CM | POA: Insufficient documentation

## 2021-06-18 MED ORDER — TRAMADOL HCL 50 MG PO TABS: 50.0000 mg | ORAL_TABLET | Freq: Four times a day (QID) | ORAL | 0 refills | Status: DC | PRN

## 2021-06-18 MED ORDER — AMANTADINE HCL 100 MG PO CAPS: 100.0000 mg | ORAL_CAPSULE | Freq: Every day | ORAL | 5 refills | Status: DC

## 2021-06-18 NOTE — Patient Instructions (Signed)
Pt is a 66 yr old female with hx of RA- hands, elbows, shoulders and neck-  DJD of "entire back"; borderline diabetes; GERD; no HTN. Also has scoliosis 15-20 degrees thoracolumbar and lumbar radiculopathy based on xray results. Also has Fibromyalgia-  ? ?Here for f/u on chronic back pain and RA chronic pain.  ? ?Amantadine 100 mg daily x 3 days, then 200 mg ? ?2. Now on Humira for RA- just started- will take time to help. Better off MTX.  ? ? ?3. - Tramadol has helped her in the past- without feeling dopey- 50 mg q 6 hours as needed- can also take 2 tabs 2x/day and can take with tylenol- synergistic- 325 mg of tylenol to make it last longer if need be.  ? ? ?4. UDS and opiate contract since starting Tramadol for pain.  ? ?5. In 5 days, call me to let me know if things working- and will hopefully have UDS back as well.  ? ? ?6. Wait on handicapped placard but let me know if needs one.  ? ?7. F/U in 3 months- on RA/back pain ?

## 2021-06-18 NOTE — Progress Notes (Signed)
? ?Subjective:  ? ? Patient ID: Heidi Chapman, female    DOB: [birth year], 66 y.o.   MRN: [id number] ? ?HPI ? ?Pt is a 66 yr old female with hx of RA- hands, elbows, shoulders and neck-  DJD of "entire back"; borderline diabetes; GERD; no HTN. Also has scoliosis 15-20 degrees thoracolumbar and lumbar radiculopathy base don xray results.  ? ?Here for f/u on chronic pain.  ? ?Underlying Fibromyalgia pain is going away.  ?On Duloxetine  ?Doesn't make her sleepy or stay awake-  ?Dulls pain, but doesn't make it go away.  ? ?Did have some nausea a few times, but hadn't eaten- no other Sx's.  ? ? ?Saw the OT- for energy conservation- focused on coping skills-  ?Focused on Am routine- and doing thing sin spells vs all the time ?Heat therapy for hands ?Stress ball made pain worse, so quit.  ?Did feel was helpful- was very nice- went to [facility] on [address]  ? ?Saw Sleep NP- there is concern about her amount of sleep-  ?Depression?- doesn't feel depressed.  ? ?Energy is so poor- can tell a big drop in last 6 months  ? ?Has to rest after just a shower- if sits down, will go to sleep.  ? ? ? ? ? ?Pain Inventory ?Average Pain 6 ?Pain Right Now 4 ?My pain is intermittent, sharp, burning, dull, stabbing, and aching ? ?In the last 24 hours, has pain interfered with the following? ?General activity 10 ?Relation with others 10 ?Enjoyment of life 4 ?What TIME of day is your pain at its worst? daytime ?Sleep (in general) Good ? ?Pain is worse with: walking, bending, sitting, standing, and some activites ?Pain improves with: rest, heat/ice, and medication ?Relief from Meds: 3 ? ?Family History  ?Problem Relation Age of Onset  ? Cancer - Lung Mother   ? Brain cancer Mother   ? Colon polyps Father   ? Prostate cancer Father   ? Diverticulitis Father   ? Heart attack Father   ? Cancer Maternal Grandmother   ? Cancer Maternal Grandfather   ? Cancer Paternal Grandfather   ? ?Social History  ? ?Socioeconomic History  ? Marital status: Legally  Separated  ?  Spouse name: Not on file  ? Number of children: Not on file  ? Years of education: Not on file  ? Highest education level: Not on file  ?Occupational History  ? Not on file  ?Tobacco Use  ? Smoking status: Former  ?  Packs/day: 0.50  ?  Years: 40.00  ?  Pack years: 20.00  ?  Types: Cigarettes  ?  Quit date: 02/14/2021  ?  Years since quitting: 0.3  ?  Passive exposure: Past  ? Smokeless tobacco: Never  ? Tobacco comments:  ?  1/2 pk a day.  Wants Chantex  ?Vaping Use  ? Vaping Use: Never used  ?Substance and Sexual Activity  ? Alcohol use: Yes  ?  Comment: 1-3 drinks annually  ? Drug use: Never  ? Sexual activity: Not on file  ?Other Topics Concern  ? Not on file  ?Social History Narrative  ? Lives with youngest daughter and granddaughter  ? Right handed  ? Caffeine: 1 cup of coffee and 1 soda daily  ? ?Social Determinants of Health  ? ?Financial Resource Strain: Not on file  ?Food Insecurity: Not on file  ?Transportation Needs: Not on file  ?Physical Activity: Not on file  ?Stress: Not on file  ?Social  Connections: Not on file  ? ?Past Surgical History:  ?Procedure Laterality Date  ? CESAREAN SECTION    ? CHOLECYSTECTOMY N/A 10/24/2017  ? Procedure: LAPAROSCOPIC CHOLECYSTECTOMY WITH INTRAOPERATIVE CHOLANGIOGRAM;  Surgeon: Erroll Luna, MD;  Location: [facility];  Service: General;  Laterality: N/A;  ? GALLBLADDER SURGERY    ? TUBAL LIGATION    ? ?Past Surgical History:  ?Procedure Laterality Date  ? CESAREAN SECTION    ? CHOLECYSTECTOMY N/A 10/24/2017  ? Procedure: LAPAROSCOPIC CHOLECYSTECTOMY WITH INTRAOPERATIVE CHOLANGIOGRAM;  Surgeon: Erroll Luna, MD;  Location: [place];  Service: General;  Laterality: N/A;  ? GALLBLADDER SURGERY    ? TUBAL LIGATION    ? ?Past Medical History:  ?Diagnosis Date  ? Absence of both cervix and uterus, acquired 12/14/2011  ? Acalculous cholecystitis 10/23/2017  ? Acquired deformity of right hand 03/13/2021  ? Acute left-sided low back pain without sciatica 03/13/2019  ?  Anxiety   ? Anxiety 01/26/2019  ? Arthritis   ? Benign neoplasm of colon 12/14/2011  ? Fatigue 06/28/2020  ? Fibromyalgia   ? GERD (gastroesophageal reflux disease)   ? Healthcare maintenance 12/06/2017  ? High risk medication use 03/13/2021  ? Lymphadenopathy 03/13/2021  ? Migraine   ? Neck pain 03/13/2021  ? Orthopnea 08/29/2020  ? Overweight 03/13/2021  ? Polyarthralgia 01/26/2019  ? Screening for colon cancer 06/28/2020  ? Screening mammogram for breast cancer 06/28/2020  ? Shortness of breath at rest 08/29/2020  ? Snoring 08/29/2020  ? ?BP (!) 142/75   Pulse 84   Ht '5\' 5"'$  (1.651 m)   Wt 190 lb (86.2 kg)   SpO2 93%   BMI 31.62 kg/m?  ? ?Opioid Risk Score:   ?Fall Risk Score:  `1 ? ?Depression screen PHQ 2/9 ? ? ?  06/18/2021  ? 11:00 AM 03/19/2021  ?  9:35 AM 06/28/2020  ? 12:08 PM 12/14/2019  ? 10:48 AM 11/30/2019  ? 11:24 AM 11/23/2019  ? 10:14 AM 03/13/2019  ?  2:55 PM  ?Depression screen PHQ 2/9  ?Decreased Interest 0 0 1 0 0 2 0  ?Down, Depressed, Hopeless 0 0 0 0 0 0 0  ?PHQ - 2 Score 0 0 1 0 0 2 0  ?Altered sleeping  3 3  0 2 0  ?Tired, decreased energy  3 3 0 0 2 3  ?Change in appetite  1 1 0 0 2 0  ?Feeling bad or failure about yourself   0 0 0 0 0 0  ?Trouble concentrating  3 0 0 0 0 0  ?Moving slowly or fidgety/restless  0 0 0 0 0 0  ?Suicidal thoughts  0 0 0 0 0 0  ?PHQ-9 Score  10 8  0 8 3  ?Difficult doing work/chores  Somewhat difficult Somewhat difficult  Not difficult at all Somewhat difficult Not difficult at all  ?  ? ? ?Review of Systems  ?Musculoskeletal:  Positive for back pain.  ?     Bilateral shoulder pain ?Bilateral elbow pain ?Bilateral hip pain ?Bilateral hand pain  ?All other systems reviewed and are negative. ? ?   ?Objective:  ? Physical Exam ?Awake, alert, appropriate, fatigued affect, NAD ?TTP across low back in band ?B/L hand RA changes- chronic ? ? ? ?   ?Assessment & Plan:  ? ?Pt is a 66 yr old female with hx of RA- hands, elbows, shoulders and neck-  DJD of "entire back"; borderline  diabetes; GERD; no HTN. Also has scoliosis 15-20  degrees thoracolumbar and lumbar radiculopathy based on xray results. Also has Fibromyalgia-  ? ?Here for f/u on chronic back pain and RA chronic pain.  ? ?Amantadine 100 mg daily x 3 days, then 200 mg ? ?2. Now on Humira for RA- just started- will take time to help. Better off MTX.  ? ? ?3. - Tramadol has helped her in the past- without feeling dopey- 50 mg q 6 hours as needed- can also take 2 tabs 2x/day and can take with tylenol- synergistic- 325 mg of tylenol to make it last longer if need be.  ? ? ?4. UDS and opiate contract since starting Tramadol for pain.  ? ?5. In 5 days, call me to let me know if things working- and will hopefully have UDS back as well.  ? ? ?6. Wait on handicapped placard but let me know if needs one.  ? ?7. F/U in 3 months- on RA/back pain ? ?I spent a total of 22   minutes on total care today- >50% coordination of care- due to education on opiate contract, UDS, pain meds, etc.  ? ?

## 2021-06-23 ENCOUNTER — Telehealth: Payer: Self-pay

## 2021-06-23 MED ORDER — TRAMADOL HCL 50 MG PO TABS: 50.0000 mg | ORAL_TABLET | Freq: Four times a day (QID) | ORAL | 5 refills | Status: DC | PRN

## 2021-06-23 NOTE — Addendum Note (Signed)
Addended by: Courtney Heys on: 06/23/2021 01:08 PM ? ? Modules accepted: Orders ? ?

## 2021-06-23 NOTE — Telephone Encounter (Signed)
Patient called stating the Tramadol is helping her pain. She wanted to let Dr. Dagoberto Ligas know it was working  ?

## 2021-06-25 LAB — TOXASSURE SELECT,+ANTIDEPR,UR

## 2021-06-26 ENCOUNTER — Telehealth: Payer: Self-pay | Admitting: *Deleted

## 2021-06-26 NOTE — Telephone Encounter (Signed)
Urine drug screen for this encounter is consistent for prescribed medication. No controlled medication taken at this time.  ?

## 2021-07-01 ENCOUNTER — Telehealth: Payer: Self-pay

## 2021-07-01 NOTE — Telephone Encounter (Signed)
Received letter by fax requesting OV notes for insurance.  ?Notes faxed to DME: [employer] ?[phone] and [phone] ?Fax confirmation received.  ?

## 2021-07-07 ENCOUNTER — Other Ambulatory Visit: Payer: Self-pay | Admitting: Internal Medicine

## 2021-07-07 DIAGNOSIS — Z79899 Other long term (current) drug therapy: Secondary | ICD-10-CM

## 2021-07-07 DIAGNOSIS — M0579 Rheumatoid arthritis with rheumatoid factor of multiple sites without organ or systems involvement: Secondary | ICD-10-CM

## 2021-07-07 NOTE — Telephone Encounter (Signed)
Next Visit: 08/06/2021  Last Visit: 06/04/2021  Last Fill: 04/17/2021  [id number] arthritis involving multiple sites with positive rheumatoid factor   Current Dose per office note 06/04/2021: Humira 40 mg subcu q. 14 days.  Labs: 06/04/2021 Labs look okay her liver function test is very slightly abnormal but no problem for continuing Humira  TB Gold: 03/13/2021 Neg    Okay to refill Humira?

## 2021-08-06 ENCOUNTER — Encounter: Payer: Self-pay | Admitting: Internal Medicine

## 2021-08-06 ENCOUNTER — Ambulatory Visit: Payer: 59 | Admitting: Internal Medicine

## 2021-08-06 VITALS — BP 133/79 | HR 84 | Resp 15 | Ht 65.0 in | Wt 187.0 lb

## 2021-08-06 DIAGNOSIS — G4733 Obstructive sleep apnea (adult) (pediatric): Secondary | ICD-10-CM

## 2021-08-06 DIAGNOSIS — R5382 Chronic fatigue, unspecified: Secondary | ICD-10-CM | POA: Diagnosis not present

## 2021-08-06 DIAGNOSIS — Z79899 Other long term (current) drug therapy: Secondary | ICD-10-CM | POA: Diagnosis not present

## 2021-08-06 DIAGNOSIS — M0579 Rheumatoid arthritis with rheumatoid factor of multiple sites without organ or systems involvement: Secondary | ICD-10-CM

## 2021-08-06 NOTE — Progress Notes (Signed)
Office Visit Note  Patient: Heidi Chapman             Date of Birth: Apr 07, 1955           MRN: [id number]             PCP: Farrel Gordon, DO Referring: Farrel Gordon, DO Visit Date: 08/06/2021   Subjective:  Follow-up (Doing good)   History of Present Illness: Heidi Chapman is a 66 y.o. female here for follow up for seropositive RA on Humira 40 mg [place] q14days started in March. She has generalized pain and fatigue related to fibromyalgia syndrome and DJD of her back, followed by Dr. Dagoberto Ligas currently on duloxetine and PRN tramadol. Symptoms are doing somewhat better. She still feels daily stiffness but is not seeing much swelling and pain is decreased in elbow and hands and can tightly close fists. She has had several URI type symptoms intermittently feels like she gets sick when her granddaughter brings germs home from school, no antibiotics treatment or pneumonia.  Previous HPI 06/04/21 Heidi Chapman is a 66 y.o. female here for follow up for seropositive RA on Humira 40 mg [place] q14days started last month. Previous MTX treatment was not resumed due to abnormal baseline LFTs. She felt improvement after about 3 days on prednisone taper. Inflammation remains better but came back somewhat, but no large swollen nodules on the neck recently. Duloxetine is helping her chronic pain overall but still hurts with any activity more than an hour. She followed up with her neurology for the sleep apnea and fatigue still very fatigued limiting her activity.   Previous HPI 03/13/21 Heidi Chapman is a 66 y.o. female here for seropositive rheumatoid arthritis which has been treated with methotrexate 17.5 mg [place] weekly and simponi aria infusion and taking folic acid 3 mg daily. In addition to RA she suffers pain with OA including lumbar spine DJD and fibromyalgia pain treated with naproxen 500 mg PRN and flexeril.  She reports joint pain started since many years ago particularly with progressive pain and  stiffness and then lateral deviation worsening in MCP joints of both hands.  She also had chronic osteoarthritis particularly in her back with sciatica and back pain at multiple levels.  She finally went for evaluation of the hand pain and deformities since about 3 years ago was diagnosed with seropositive rheumatoid arthritis.  Initial treatments tried include leflunomide which was not tolerated and Enbrel which did not have clinical response.  Oral methotrexate was not tolerable she had some degree of nausea with the subcutaneous methotrexate as well but not as bad.  While on treatment she felt disease control was very good mostly just had the chronic OA type symptoms not much swelling and no progression of deformities. She has been a patient with Dr. Trudie Reed needing to transfer care due to change in insurance status.  She is now off all of her medications since about a month ago and is noticing worsening joint pains again.  Particularly since 2 weeks ago she has much worse neck pain is developed lymph node swelling on the back of her skull and neck.  She went to the emergency department for this were no specific problems were found liver function test showed a mild elevation she was prescribed Keflex empirically for possible infection has not noticed any difference since taking this medicine. Besides joint pain and lymph node swelling she feels extremely fatigued overall.   DMARD Hx Enbrel primary nonresponder Simponi Donalda Ewings  TOC Methotrexate [place] TOC Methotrexate PO GI intolerance Leflunomide not tolerated   Labs reviewed 02/2021 ESR 26 CBC wnl CMP AST 43 ALT 56 Alk phos 149   03/2019 TB neg HBV neg HCV neg   01/2019 RA 109 CCP >250   10/2017 HIV neg   Review of Systems  Constitutional:  Positive for fatigue.  HENT:  Positive for mouth dryness.   Eyes:  Negative for dryness.  Respiratory:  Positive for shortness of breath.   Cardiovascular:  Negative for swelling in legs/feet.   Gastrointestinal:  Negative for constipation.  Endocrine: Positive for excessive thirst.  Genitourinary:  Negative for difficulty urinating.  Musculoskeletal:  Positive for joint pain, joint pain, joint swelling, muscle weakness and muscle tenderness.  Skin:  Negative for rash.  Allergic/Immunologic: Positive for susceptible to infections.  Neurological:  Positive for numbness and weakness.  Hematological:  Negative for bruising/bleeding tendency.  Psychiatric/Behavioral:  Positive for sleep disturbance.     PMFS History:  Patient Active Problem List   Diagnosis Date Noted   Fibromyalgia 06/04/2021   Depression with anxiety 05/20/2021   Chronic bilateral back pain 03/19/2021   Other idiopathic scoliosis, thoracolumbar region 03/19/2021   Lumbar radiculopathy 03/19/2021   High risk medication use 03/13/2021   Body mass index (BMI) 30.0-30.9, adult 03/13/2021   Osteoarthritis 11/18/2020   Obstructive sleep apnea 08/29/2020   Chronic fatigue 06/28/2020   Shortness of breath 06/28/2020   Hyperlipidemia 06/28/2020   Prediabetes 06/28/2020   Rheumatoid arthritis involving multiple sites with positive rheumatoid factor ([place]) 03/13/2019    Past Medical History:  Diagnosis Date   Absence of both cervix and uterus, acquired 12/14/2011   Acalculous cholecystitis 10/23/2017   Acquired deformity of right hand 03/13/2021   Acute left-sided low back pain without sciatica 03/13/2019   Anxiety    Anxiety 01/26/2019   Arthritis    Benign neoplasm of colon 12/14/2011   Fatigue 06/28/2020   Fibromyalgia    GERD (gastroesophageal reflux disease)    Healthcare maintenance 12/06/2017   High risk medication use 03/13/2021   Lymphadenopathy 03/13/2021   Migraine    Neck pain 03/13/2021   Orthopnea 08/29/2020   Overweight 03/13/2021   Polyarthralgia 01/26/2019   Screening for colon cancer 06/28/2020   Screening mammogram for breast cancer 06/28/2020   Shortness of breath at rest 08/29/2020   Snoring  08/29/2020    Family History  Problem Relation Age of Onset   Cancer - Lung Mother    Brain cancer Mother    Colon polyps Father    Prostate cancer Father    Diverticulitis Father    Heart attack Father    Cancer Maternal Grandmother    Cancer Maternal Grandfather    Cancer Paternal Grandfather    Past Surgical History:  Procedure Laterality Date   CESAREAN SECTION     CHOLECYSTECTOMY N/A 10/24/2017   Procedure: LAPAROSCOPIC CHOLECYSTECTOMY WITH INTRAOPERATIVE CHOLANGIOGRAM;  Surgeon: Erroll Luna, MD;  Location: [facility];  Service: General;  Laterality: N/A;   GALLBLADDER SURGERY     TUBAL LIGATION     Social History   Social History Narrative   Lives with youngest daughter and granddaughter   Right handed   Caffeine: 1 cup of coffee and 1 soda daily   Immunization History  Administered Date(s) Administered   Influenza,inj,Quad PF,6+ Mos 01/26/2019, 11/18/2020   PFIZER(Purple Top)SARS-COV-2 Vaccination 05/13/2019, 06/07/2019, 10/30/2019     Objective: Vital Signs: BP 133/79 (BP Location: Right Arm, Patient Position: Sitting, Cuff Size:  Normal)   Pulse 84   Resp 15   Ht 5' 5"  (1.651 m)   Wt 187 lb (84.8 kg)   BMI 31.12 kg/m    Physical Exam Constitutional:      Appearance: She is obese.  Cardiovascular:     Rate and Rhythm: Normal rate and regular rhythm.     Heart sounds: Murmur (systolic) heard.  Pulmonary:     Effort: Pulmonary effort is normal.     Breath sounds: Normal breath sounds.  Musculoskeletal:     Right lower leg: No edema.     Left lower leg: No edema.  Skin:    General: Skin is warm and dry.     Findings: No rash.  Neurological:     Mental Status: She is alert.     Motor: No weakness.  Psychiatric:        Mood and Affect: Mood normal.      Musculoskeletal Exam:  Shoulder ROM is intact but pain with overhead abduction and external rotation, tenderness to pressure over lateral shoulder more on left side, upper back tenderness across  trapezius, paraspinal muscles, and base of neck Elbows full ROM, right elbow tenderness to pressure worst around lateral epicondyle Wrists full ROM no tenderness or swelling Fingers full ROM no tenderness or swelling Knees full ROM no tenderness or swelling Ankles full ROM no tenderness or swelling 1st MTP bunions, no palpable swelling  CDAI Exam: CDAI Score: 4  Patient Global: 30 mm; Provider Global: 10 mm Swollen: 0 ; Tender: 0  Joint Exam 08/06/2021   All documented joints were normal     Investigation: No additional findings.  Imaging: No results found.  Recent Labs: Lab Results  Component Value Date   WBC 6.9 06/04/2021   HGB 16.4 (H) 06/04/2021   PLT 262 06/04/2021   NA 142 06/04/2021   K 4.2 06/04/2021   CL 108 06/04/2021   CO2 23 06/04/2021   GLUCOSE 117 (H) 06/04/2021   BUN 13 06/04/2021   CREATININE 0.93 06/04/2021   BILITOT 1.2 06/04/2021   ALKPHOS 149 (H) 03/10/2021   AST 29 06/04/2021   ALT 35 (H) 06/04/2021   PROT 7.3 06/04/2021   ALBUMIN 3.6 03/10/2021   CALCIUM 9.9 06/04/2021   GFRAA 84 01/26/2019   QFTBGOLDPLUS NEGATIVE 03/13/2021    Speciality Comments: Leflunomide - intolerance; Enbrel - inadequate response; oral MTX - intolerable side effects; SImponi Aria infusions stopped in Jan 2023 and switched to Humira for convenience of self-injection; MTX stopped in Feb 2023 due to elevated LFTs Humira started 04/17/21  Procedures:  No procedures performed Allergies: Leflunomide, Peanut butter flavor, and Latex   Assessment / Plan:     Visit Diagnoses: Rheumatoid arthritis involving multiple sites with positive rheumatoid factor ([place]) - Plan: Sedimentation rate  Symptoms appear partially improved definitely seeing more mobility with her hands and elbows.  Pain in the low back area probably more related to degenerative arthritis and may or may not see more benefit with this.  We will recheck sed rate for disease activity monitoring.  Plan to continue  Humira 40 mg subcu q. 14 days.  High risk medication use - Plan: CBC with Differential/Platelet, COMPLETE METABOLIC PANEL WITH GFR  Checking CBC and CMP for medication monitoring on Humira.  Is not having any clear medication side effects.  Reporting increased URI type symptoms I am not sure if these are being exacerbated or prolonged on the medication but none have developed to severe infections.  Chronic fatigue Obstructive sleep apnea  Probably combination of factors with inflammatory arthritis fibromyalgia syndrome and OSA contributing to fatigue.  Also reports recent URI symptoms might be contributing.  Orders: Orders Placed This Encounter  Procedures   Sedimentation rate   CBC with Differential/Platelet   COMPLETE METABOLIC PANEL WITH GFR   No orders of the defined types were placed in this encounter.    Follow-Up Instructions: Return in about 3 months (around 11/06/2021) for RA on Humira f/u 33mo.   CCollier Salina MD  Note - This record has been created using [employer]  Chart creation errors have been sought, but may not always  have been located. Such creation errors do not reflect on  the standard of medical care.

## 2021-08-08 IMAGING — DX DG HIP (WITH OR WITHOUT PELVIS) 2-3V*L*
3 series · 3 of 3 positions shown · non-contrast
Comparison: None.

CLINICAL DATA: Pain at left lateral hip after injury.

EXAM:
DG HIP (WITH OR WITHOUT PELVIS) 2-3V LEFT

[pelvis ap]
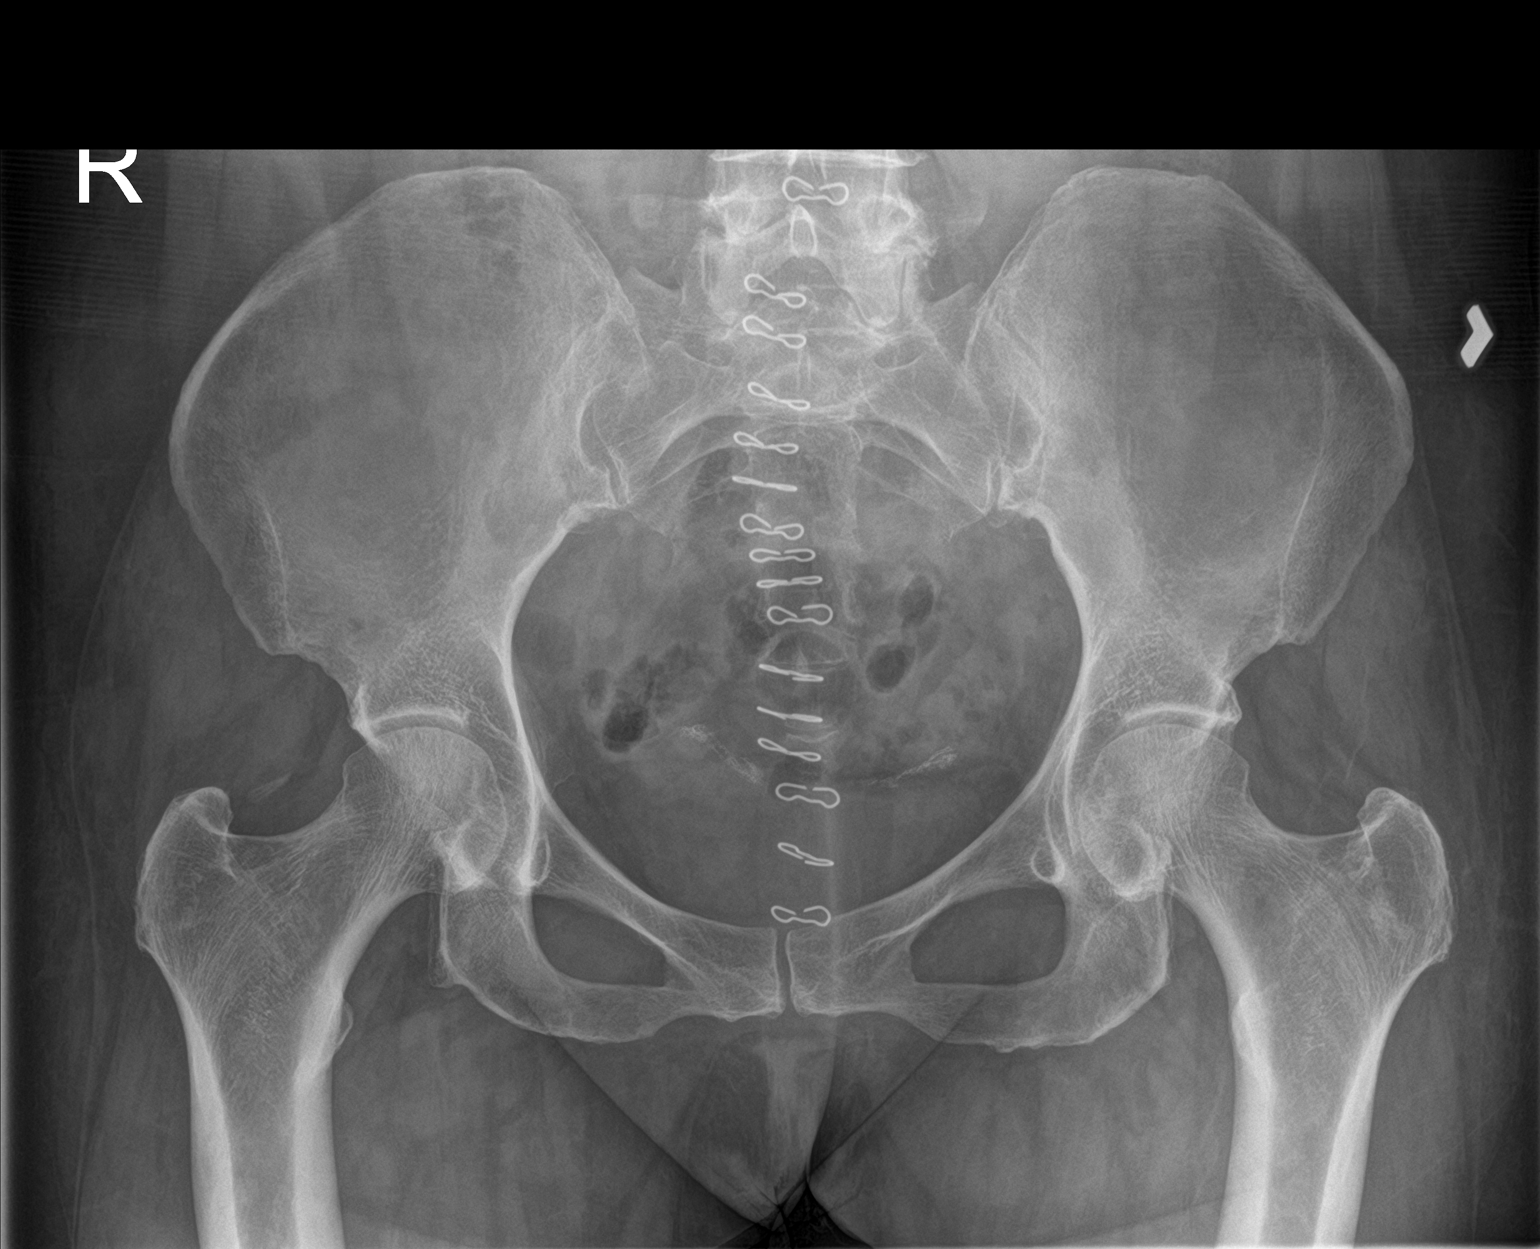

[hip ap]
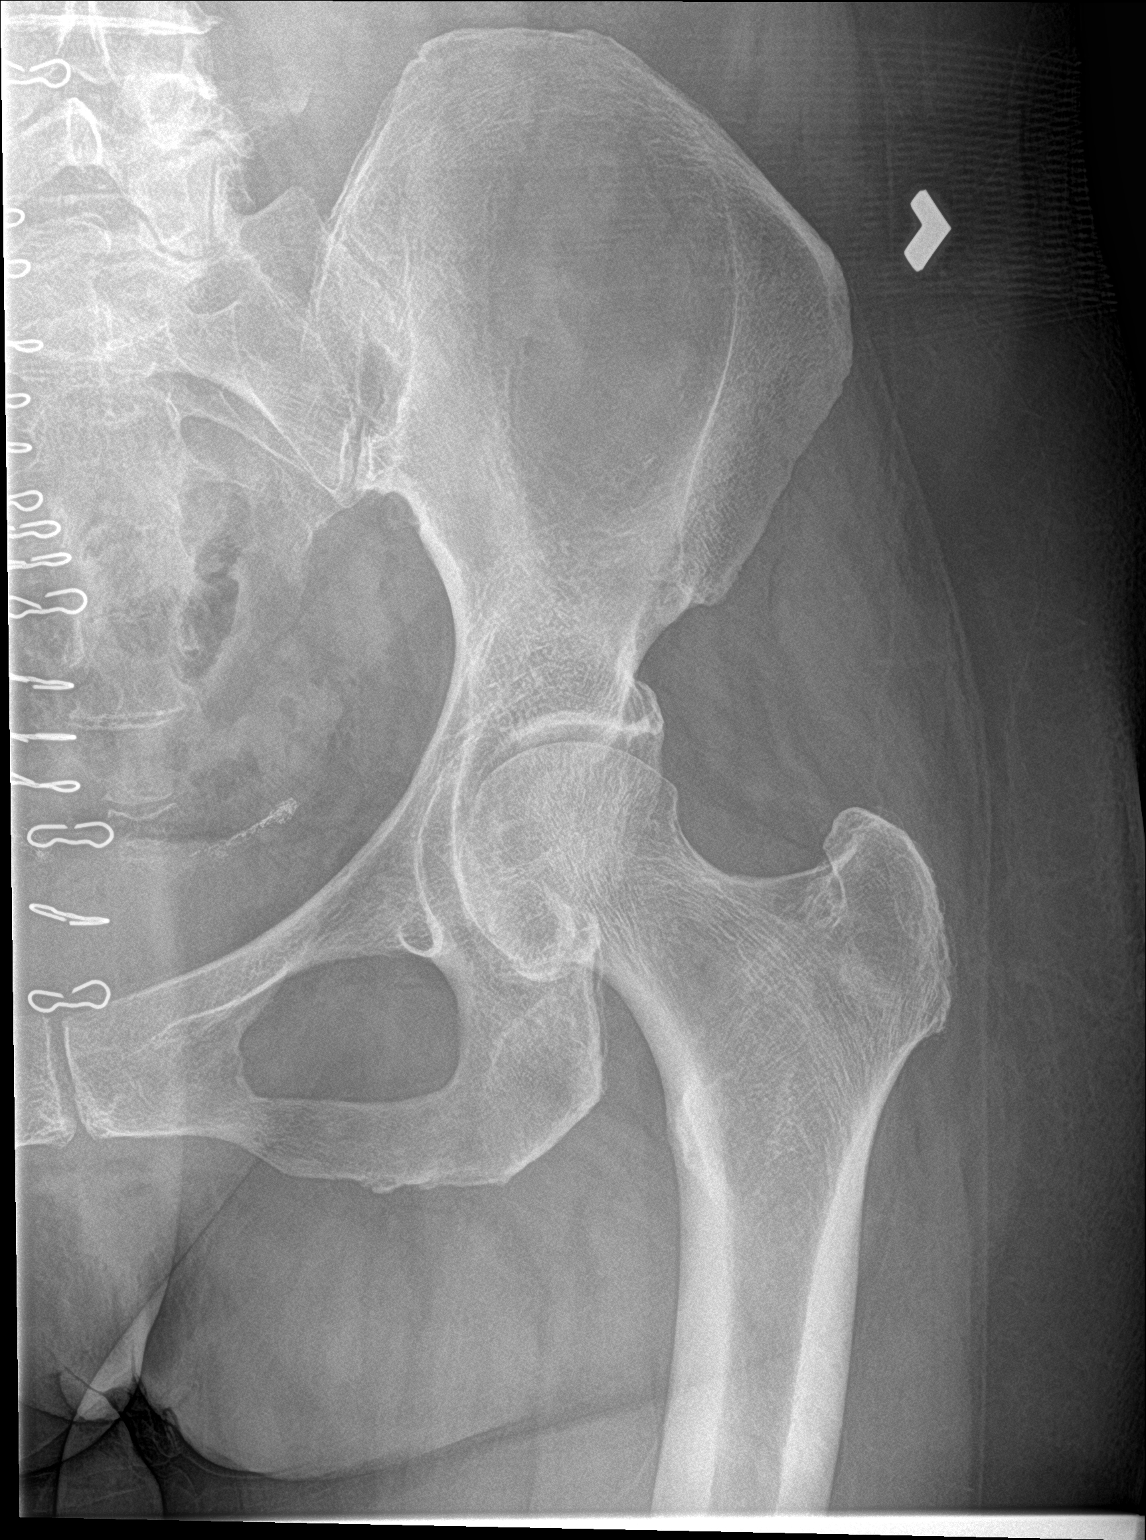

[hip lat]
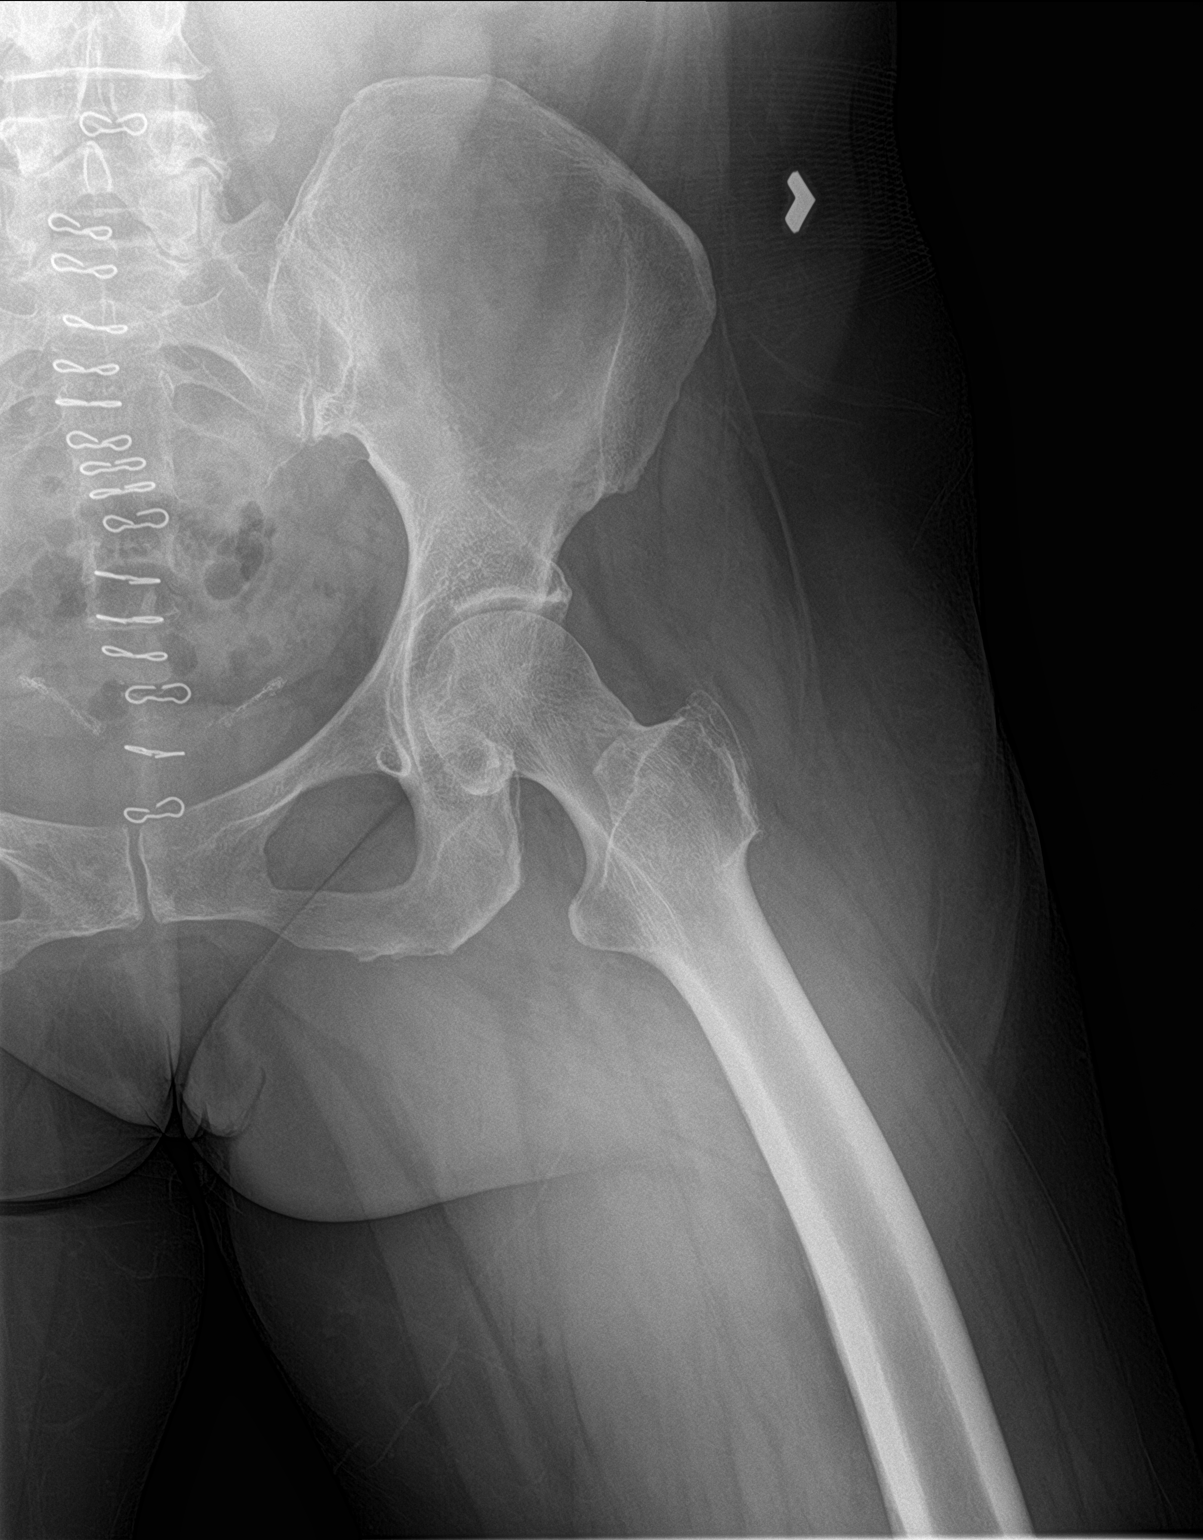

[3 of 3 positions shown; findings below may reference images not displayed]

FINDINGS: There is no evidence of hip fracture or dislocation. No acute soft
tissue finding. Postoperative pelvis.
IMPRESSION: Negative.

## 2021-08-17 ENCOUNTER — Encounter (HOSPITAL_COMMUNITY): Payer: Self-pay

## 2021-08-17 ENCOUNTER — Emergency Department (HOSPITAL_COMMUNITY): Payer: 59

## 2021-08-17 ENCOUNTER — Inpatient Hospital Stay (HOSPITAL_COMMUNITY)
Admission: EM | Admit: 2021-08-17 | Discharge: 2021-08-19 | DRG: 871 | Disposition: A | Discharge status: Home / Self Care | Payer: 59 | Attending: Internal Medicine | Admitting: Internal Medicine

## 2021-08-17 ENCOUNTER — Other Ambulatory Visit: Payer: Self-pay

## 2021-08-17 DIAGNOSIS — M549 Dorsalgia, unspecified: Secondary | ICD-10-CM | POA: Diagnosis present

## 2021-08-17 DIAGNOSIS — J189 Pneumonia, unspecified organism: Secondary | ICD-10-CM

## 2021-08-17 DIAGNOSIS — G4733 Obstructive sleep apnea (adult) (pediatric): Secondary | ICD-10-CM | POA: Diagnosis present

## 2021-08-17 DIAGNOSIS — Z8042 Family history of malignant neoplasm of prostate: Secondary | ICD-10-CM

## 2021-08-17 DIAGNOSIS — G8929 Other chronic pain: Secondary | ICD-10-CM | POA: Diagnosis present

## 2021-08-17 DIAGNOSIS — Z8249 Family history of ischemic heart disease and other diseases of the circulatory system: Secondary | ICD-10-CM

## 2021-08-17 DIAGNOSIS — N179 Acute kidney failure, unspecified: Secondary | ICD-10-CM | POA: Diagnosis present

## 2021-08-17 DIAGNOSIS — Z8371 Family history of colonic polyps: Secondary | ICD-10-CM

## 2021-08-17 DIAGNOSIS — Z86010 Personal history of colonic polyps: Secondary | ICD-10-CM

## 2021-08-17 DIAGNOSIS — M797 Fibromyalgia: Secondary | ICD-10-CM | POA: Diagnosis present

## 2021-08-17 DIAGNOSIS — Z79899 Other long term (current) drug therapy: Secondary | ICD-10-CM

## 2021-08-17 DIAGNOSIS — A419 Sepsis, unspecified organism: Secondary | ICD-10-CM | POA: Diagnosis not present

## 2021-08-17 DIAGNOSIS — Z808 Family history of malignant neoplasm of other organs or systems: Secondary | ICD-10-CM

## 2021-08-17 DIAGNOSIS — M0579 Rheumatoid arthritis with rheumatoid factor of multiple sites without organ or systems involvement: Secondary | ICD-10-CM | POA: Diagnosis present

## 2021-08-17 DIAGNOSIS — Z20822 Contact with and (suspected) exposure to covid-19: Secondary | ICD-10-CM | POA: Diagnosis present

## 2021-08-17 LAB — URINALYSIS, ROUTINE W REFLEX MICROSCOPIC
Bilirubin Urine: NEGATIVE
Glucose, UA: >=500 mg/dL — AB
Hgb urine dipstick: NEGATIVE
Ketones, ur: NEGATIVE mg/dL
Nitrite: NEGATIVE
Protein, ur: 100 mg/dL — AB
Specific Gravity, Urine: 1.02 (ref 1.005–1.030)
pH: 5 (ref 5.0–8.0)

## 2021-08-17 LAB — CBC WITH DIFFERENTIAL/PLATELET
Abs Immature Granulocytes: 0.06 10*3/uL (ref 0.00–0.07)
Basophils Absolute: 0.1 10*3/uL (ref 0.0–0.1)
Basophils Relative: 1 %
Eosinophils Absolute: 0 10*3/uL (ref 0.0–0.5)
Eosinophils Relative: 0 %
HCT: 41.6 % (ref 36.0–46.0)
Hemoglobin: 13.7 g/dL (ref 12.0–15.0)
Immature Granulocytes: 1 %
Lymphocytes Relative: 15 %
Lymphs Abs: 1.3 10*3/uL (ref 0.7–4.0)
MCH: 30 pg (ref 26.0–34.0)
MCHC: 32.9 g/dL (ref 30.0–36.0)
MCV: 91 fL (ref 80.0–100.0)
Monocytes Absolute: 1 10*3/uL (ref 0.1–1.0)
Monocytes Relative: 12 %
Neutro Abs: 6.1 10*3/uL (ref 1.7–7.7)
Neutrophils Relative %: 71 %
Platelets: 236 10*3/uL (ref 150–400)
RBC: 4.57 MIL/uL (ref 3.87–5.11)
RDW: 12.5 % (ref 11.5–15.5)
WBC: 8.5 10*3/uL (ref 4.0–10.5)
nRBC: 0 % (ref 0.0–0.2)

## 2021-08-17 LAB — LACTIC ACID, PLASMA
Lactic Acid, Venous: 3.3 mmol/L (ref 0.5–1.9)
Lactic Acid, Venous: 1.9 mmol/L (ref 0.5–1.9)

## 2021-08-17 LAB — COMPREHENSIVE METABOLIC PANEL
ALT: 41 U/L (ref 0–44)
AST: 44 U/L — ABNORMAL HIGH (ref 15–41)
Albumin: 3.1 g/dL — ABNORMAL LOW (ref 3.5–5.0)
Alkaline Phosphatase: 156 U/L — ABNORMAL HIGH (ref 38–126)
Anion gap: 13 (ref 5–15)
BUN: 8 mg/dL (ref 8–23)
CO2: 23 mmol/L (ref 22–32)
Calcium: 8.9 mg/dL (ref 8.9–10.3)
Chloride: 99 mmol/L (ref 98–111)
Creatinine, Ser: 1.17 mg/dL — ABNORMAL HIGH (ref 0.44–1.00)
GFR, Estimated: 52 mL/min — ABNORMAL LOW (ref >60)
Glucose, Bld: 259 mg/dL — ABNORMAL HIGH (ref 70–99)
Potassium: 4 mmol/L (ref 3.5–5.1)
Sodium: 135 mmol/L (ref 135–145)
Total Bilirubin: 2.7 mg/dL — ABNORMAL HIGH (ref 0.3–1.2)
Total Protein: 7.1 g/dL (ref 6.5–8.1)

## 2021-08-17 LAB — STREP PNEUMONIAE URINARY ANTIGEN: Strep Pneumo Urinary Antigen: NEGATIVE

## 2021-08-17 LAB — SARS CORONAVIRUS 2 BY RT PCR: SARS Coronavirus 2 by RT PCR: NEGATIVE

## 2021-08-17 MED ORDER — METOCLOPRAMIDE HCL 5 MG/ML IJ SOLN
5.0000 mg | Freq: Once | INTRAMUSCULAR | Status: AC
  Administered 2021-08-17: 5 mg via INTRAVENOUS
  Filled 2021-08-17: qty 2

## 2021-08-17 MED ORDER — ATORVASTATIN CALCIUM 10 MG PO TABS
10.0000 mg | ORAL_TABLET | Freq: Every day | ORAL | Status: DC
  Administered 2021-08-18 – 2021-08-19 (×2): 10 mg via ORAL
  Filled 2021-08-17 (×2): qty 1

## 2021-08-17 MED ORDER — LACTATED RINGERS IV BOLUS (SEPSIS)
1000.0000 mL | Freq: Once | INTRAVENOUS | Status: AC
  Administered 2021-08-17: 1000 mL via INTRAVENOUS

## 2021-08-17 MED ORDER — SODIUM CHLORIDE 0.9 % IV SOLN
500.0000 mg | INTRAVENOUS | Status: DC
  Administered 2021-08-17: 500 mg via INTRAVENOUS
  Filled 2021-08-17: qty 5

## 2021-08-17 MED ORDER — LACTATED RINGERS IV SOLN
INTRAVENOUS | Status: AC
Start: 2021-08-17 — End: 2021-08-18

## 2021-08-17 MED ORDER — KETOROLAC TROMETHAMINE 15 MG/ML IJ SOLN
15.0000 mg | Freq: Once | INTRAMUSCULAR | Status: AC
  Administered 2021-08-17: 15 mg via INTRAVENOUS
  Filled 2021-08-17: qty 1

## 2021-08-17 MED ORDER — SODIUM CHLORIDE 0.9 % IV SOLN
1.0000 g | INTRAVENOUS | Status: DC
  Administered 2021-08-17: 1 g via INTRAVENOUS
  Filled 2021-08-17: qty 10

## 2021-08-17 MED ORDER — ENOXAPARIN SODIUM 40 MG/0.4ML IJ SOSY
40.0000 mg | PREFILLED_SYRINGE | INTRAMUSCULAR | Status: DC
Start: 2021-08-17 — End: 2021-08-19
  Administered 2021-08-17 – 2021-08-18 (×2): 40 mg via SUBCUTANEOUS
  Filled 2021-08-17 (×2): qty 0.4

## 2021-08-17 MED ORDER — FENTANYL CITRATE PF 50 MCG/ML IJ SOSY
50.0000 ug | PREFILLED_SYRINGE | Freq: Once | INTRAMUSCULAR | Status: AC
  Administered 2021-08-17: 50 ug via INTRAVENOUS
  Filled 2021-08-17: qty 1

## 2021-08-17 MED ORDER — ACETAMINOPHEN 325 MG PO TABS
650.0000 mg | ORAL_TABLET | Freq: Once | ORAL | Status: AC | PRN
  Administered 2021-08-17: 650 mg via ORAL
  Filled 2021-08-17: qty 2

## 2021-08-17 MED ORDER — LACTATED RINGERS IV BOLUS (SEPSIS): 1000.0000 mL | Freq: Once | INTRAVENOUS | Status: DC

## 2021-08-17 MED ORDER — DULOXETINE HCL 30 MG PO CPEP
30.0000 mg | ORAL_CAPSULE | Freq: Every day | ORAL | Status: DC
  Administered 2021-08-17 – 2021-08-18 (×2): 30 mg via ORAL
  Filled 2021-08-17 (×2): qty 1

## 2021-08-17 MED ORDER — SODIUM CHLORIDE 0.9 % IV SOLN: 2.0000 g | INTRAVENOUS | Status: DC

## 2021-08-17 NOTE — ED Triage Notes (Signed)
Patient complains of generalized headache with vomiting, fever, photophobia and congestion x 4days, denies taking any otc meds and not taking covid test

## 2021-08-17 NOTE — H&P (Signed)
Date: 08/17/2021               Patient Name:  Heidi Chapman MRN: [id number]  DOB: [birth year] Age / Sex: 66 y.o., female   PCP: Farrel Gordon, DO         Medical Service: Internal Medicine Teaching Service         Attending Physician: Dr. Lacretia Leigh, MD    First Contact: Dr. Alfonse Ras Pager: [phone]  Second Contact: Dr. Virl Axe Pager: [phone]       After Hours (After 5p/  First Contact Pager: [phone]  weekends / holidays): Second Contact Pager: [phone]   Chief Complaint: cough, congestion  History of Present Illness:   Ms. Roszak is a 66 year old female with history of RA, OSA on CPAP, chronic back pain, fibromyalgia presenting to [facility] with congestion and worsening SHOB over the past 4 days. She does endorse an associated mild nonproductive cough and a headache. She also endorses fever, although unsure of temperature. States that since her symptoms began, she has been feeling very bad and has not been able to perform her daily activities without getting very tired and winded. She does also report decreased PO intake during this time, states she has not eaten a full meal in the past 3 days, not currently hungry. OTC medications have not provided any relief for her. She states that she has a history of pneumonia in the past and is frustrated that she did not think of that when her symptoms began or else she would have come in sooner.  In the ED, she was found to be febrile to 101.47F and mildly tachycardic, but no leukocytosis on CBC. UA not consistent with UTI. Negative for COVID-19. Lactate is elevated to 3.3. She does have a mild AKI on labs. Blood cultures pending. CXR showing right basilar infiltrate consistent with PNA. IMTS asked to admit patient.    Meds:  No current facility-administered medications on file prior to encounter.   Current Outpatient Medications on File Prior to Encounter  Medication Sig Dispense Refill   amantadine (SYMMETREL) 100 MG capsule Take  1 capsule (100 mg total) by mouth daily. X 3 days, then 200 mg daily- for fatigue/attention 60 capsule 5   atorvastatin (LIPITOR) 10 MG tablet Take 1 tablet (10 mg total) by mouth daily. 30 tablet 11   cimetidine (TAGAMET) 200 MG tablet Take 200 mg by mouth as needed.     DULoxetine (CYMBALTA) 30 MG capsule Take 1 capsule (30 mg total) by mouth at bedtime. Take 30 mg nightly x 1 week, then 60 mg nightly- for back/nerve pain 60 capsule 3   HUMIRA PEN 40 MG/0.4ML PNKT INJECT 1 PEN SUBCUTANEOUSLY EVERY 14 DAYS 6 each 0   Multiple Vitamins-Minerals (MULTIVITAMIN WOMEN 50+) TABS See admin instructions.     naproxen (NAPROSYN) 500 MG tablet  (Patient not taking: Reported on 08/06/2021)     promethazine (PHENERGAN) 12.5 MG tablet Take 1 tablet (12.5 mg total) by mouth every 6 (six) hours as needed for nausea or vomiting. 90 tablet 1   traMADol (ULTRAM) 50 MG tablet Take 1 tablet (50 mg total) by mouth every 6 (six) hours as needed. 120 tablet 5     Allergies: Allergies as of 08/17/2021 - Review Complete 08/17/2021  Allergen Reaction Noted   Leflunomide Other (See Comments) 12/25/2020   Peanut butter flavor Other (See Comments) 12/25/2020   Latex Rash 11/26/2016   Past Medical History:  Diagnosis Date  Absence of both cervix and uterus, acquired 12/14/2011   Acalculous cholecystitis 10/23/2017   Acquired deformity of right hand 03/13/2021   Acute left-sided low back pain without sciatica 03/13/2019   Anxiety    Anxiety 01/26/2019   Arthritis    Benign neoplasm of colon 12/14/2011   Fatigue 06/28/2020   Fibromyalgia    GERD (gastroesophageal reflux disease)    Healthcare maintenance 12/06/2017   High risk medication use 03/13/2021   Lymphadenopathy 03/13/2021   Migraine    Neck pain 03/13/2021   Orthopnea 08/29/2020   Overweight 03/13/2021   Polyarthralgia 01/26/2019   Screening for colon cancer 06/28/2020   Screening mammogram for breast cancer 06/28/2020   Shortness of breath at rest 08/29/2020    Snoring 08/29/2020    Family History:  Family History  Problem Relation Age of Onset   Cancer - Lung Mother    Brain cancer Mother    Colon polyps Father    Prostate cancer Father    Diverticulitis Father    Heart attack Father    Cancer Maternal Grandmother    Cancer Maternal Grandfather    Cancer Paternal Grandfather      Social History:  Social History   Socioeconomic History   Marital status: Legally Separated    Spouse name: Not on file   Number of children: Not on file   Years of education: Not on file   Highest education level: Not on file  Occupational History   Not on file  Tobacco Use   Smoking status: Former    Packs/day: 0.50    Years: 40.00    Total pack years: 20.00    Types: Cigarettes    Quit date: 02/14/2021    Years since quitting: 0.5    Passive exposure: Past   Smokeless tobacco: Never  Vaping Use   Vaping Use: Never used  Substance and Sexual Activity   Alcohol use: Yes    Comment: 1-3 drinks annually   Drug use: Never   Sexual activity: Not on file  Other Topics Concern   Not on file  Social History Narrative   Lives with youngest daughter and granddaughter   Right handed   Caffeine: 1 cup of coffee and 1 soda daily   Social Determinants of Health   Financial Resource Strain: Not on file  Food Insecurity: Not on file  Transportation Needs: Not on file  Physical Activity: Not on file  Stress: Not on file  Social Connections: Not on file  Intimate Partner Violence: Not on file     Review of Systems: A complete ROS was negative except as per HPI.   Physical Exam: Blood pressure 115/76, pulse 87, temperature 99.4 F (37.4 C), temperature source Oral, resp. rate 19, SpO2 93 %. Physical Exam Constitutional:      Appearance: She is well-developed. She is not ill-appearing.  HENT:     Head: Normocephalic and atraumatic.     Mouth/Throat:     Mouth: Mucous membranes are moist.     Pharynx: Oropharynx is clear.  Eyes:      Extraocular Movements: Extraocular movements intact.     Pupils: Pupils are equal, round, and reactive to light.  Cardiovascular:     Rate and Rhythm: Normal rate and regular rhythm.     Heart sounds: Normal heart sounds. No murmur heard.    No gallop.  Pulmonary:     Effort: No respiratory distress.     Breath sounds: No wheezing or rhonchi.  Comments: Inspiratory crackles at right lung base Abdominal:     General: Bowel sounds are normal. There is no distension.     Palpations: Abdomen is soft.     Tenderness: There is no abdominal tenderness.  Musculoskeletal:        General: No swelling. Normal range of motion.  Skin:    General: Skin is warm and dry.  Neurological:     Mental Status: She is alert and oriented to person, place, and time.  Psychiatric:        Mood and Affect: Mood normal.        Behavior: Behavior normal.     EKG: none  CXR: personally reviewed my interpretation is R basilar infiltrate consistent with PNA  Assessment & Plan by Problem: Principal Problem:   Community acquired pneumonia Active Problems:   Rheumatoid arthritis involving multiple sites with positive rheumatoid factor (HCC)   Obstructive sleep apnea   Chronic bilateral back pain  Sepsis 2/2 Community Acquired PNA Patient with congestion, progressive dyspnea, mild nonproductive cough, weakness along with CXR showing a R basilar infiltrate consistent with pneumonia. She was febrile on admission to 101.63F along with tachycardia. Lactate initially elevated to 3.3 but has cleared in response to IVF. Patient did meet sepsis criteria and per protocol is receiving aggressive IVF along with empiric CAP treatment with rocephin and azithromycin. Slightly worsening oxygen requirements, now requiring 2L [place]. Strep pneumo urine antigen negative. -ordered azithromycin 500 mg IV and Rocephin 1 g IV  -receiving 3L bolus LR with 150 mL/hr LR maintenance  -Legionella urine antigen pending  AKI Has not eaten  in 3 days. Likely prerenal. Giving IVF. UA negative for UTI -checking BMP tomorrow morning. If not responding, will get urine NA and renal US to further evaluate cause of AKI   OSA Ordered CPAP nightly.  Dispo: Admit patient to Observation with expected length of stay less than 2 midnights.  Signed: Virl Axe, MD 08/17/2021, 4:08 PM  Pager: [phone] After 5pm on weekdays and 1pm on weekends: On Call pager: [phone]

## 2021-08-17 NOTE — ED Notes (Signed)
ED TO INPATIENT HANDOFF REPORT  ED Nurse Name and Phone #: Exie Parody, EMT-P  S Name/Age/Gender Heidi Chapman 66 y.o. female Room/Bed: 021C/021C  Code Status   Code Status: Full Code  Home/SNF/Other Home Patient oriented to: self, place, time, and situation Is this baseline? Yes   Triage Complete: Triage complete  Chief Complaint Community acquired pneumonia [J18.9]  Triage Note Patient complains of generalized headache with vomiting, fever, photophobia and congestion x 4days, denies taking any otc meds and not taking covid test   Allergies Allergies  Allergen Reactions   Leflunomide Other (See Comments)   Peanut Butter Flavor Other (See Comments)   Latex Rash    Level of Care/Admitting Diagnosis ED Disposition     ED Disposition  Admit   Condition  --   [place]: [place] [[id number]]  Level of Care: Med-Surg [16]  May place patient in observation at [facility] or [place] if equivalent level of care is available:: No  Covid Evaluation: Asymptomatic - no recent exposure (last 10 days) testing not required  Diagnosis: Community acquired pneumonia [048889]  Admitting Physician: Lottie Mussel [[id number]]  Attending Physician: Lottie Mussel [[id number]]          B Medical/Surgery History Past Medical History:  Diagnosis Date   Absence of both cervix and uterus, acquired 12/14/2011   Acalculous cholecystitis 10/23/2017   Acquired deformity of right hand 03/13/2021   Acute left-sided low back pain without sciatica 03/13/2019   Anxiety    Anxiety 01/26/2019   Arthritis    Benign neoplasm of colon 12/14/2011   Fatigue 06/28/2020   Fibromyalgia    GERD (gastroesophageal reflux disease)    Healthcare maintenance 12/06/2017   High risk medication use 03/13/2021   Lymphadenopathy 03/13/2021   Migraine    Neck pain 03/13/2021   Orthopnea 08/29/2020   Overweight 03/13/2021   Polyarthralgia 01/26/2019   Screening for colon  cancer 06/28/2020   Screening mammogram for breast cancer 06/28/2020   Shortness of breath at rest 08/29/2020   Snoring 08/29/2020   Past Surgical History:  Procedure Laterality Date   CESAREAN SECTION     CHOLECYSTECTOMY N/A 10/24/2017   Procedure: LAPAROSCOPIC CHOLECYSTECTOMY WITH INTRAOPERATIVE CHOLANGIOGRAM;  Surgeon: Erroll Luna, MD;  Location: [facility];  Service: General;  Laterality: N/A;   GALLBLADDER SURGERY     TUBAL LIGATION       A IV Location/Drains/Wounds Patient Lines/Drains/Airways Status     Active Line/Drains/Airways     Name Placement date Placement time Site Days   Peripheral IV 08/17/21 20 G Anterior;Left Forearm 08/17/21  1545  Forearm  less than 1   Closed System Drain 1 Right Abdomen Bulb (JP) 19 Fr. 10/24/17  1023  Abdomen  1393   Incision (Closed) 10/24/17 Abdomen 10/24/17  1037  -- 1393   Incision - 3 Ports Abdomen 1: Umbilicus 2: Mid;Upper 3: Right;Lateral 10/24/17  1030  -- 1393            Intake/Output Last 24 hours No intake or output data in the 24 hours ending 08/17/21 1636  Labs/Imaging Results for orders placed or performed during the hospital encounter of 08/17/21 (from the past 48 hour(s))  Urinalysis, Routine w reflex microscopic Urine, Clean Catch     Status: Abnormal   Collection Time: 08/17/21  2:10 PM  Result Value Ref Range   Color, Urine AMBER (A) YELLOW    Comment: BIOCHEMICALS MAY BE AFFECTED BY COLOR   APPearance HAZY (  A) CLEAR   Specific Gravity, Urine 1.020 1.005 - 1.030   pH 5.0 5.0 - 8.0   Glucose, UA >=500 (A) NEGATIVE mg/dL   Hgb urine dipstick NEGATIVE NEGATIVE   Bilirubin Urine NEGATIVE NEGATIVE   Ketones, ur NEGATIVE NEGATIVE mg/dL   Protein, ur 100 (A) NEGATIVE mg/dL   Nitrite NEGATIVE NEGATIVE   Leukocytes,Ua SMALL (A) NEGATIVE   RBC / HPF 0-5 0 - 5 RBC/hpf   WBC, UA 11-20 0 - 5 WBC/hpf   Bacteria, UA RARE (A) NONE SEEN   Squamous Epithelial / LPF 0-5 0 - 5   Mucus PRESENT     Comment: Performed at [facility], [place] [address], [place], [place] [address]  SARS Coronavirus 2 by RT PCR (hospital order, performed in [facility] hospital lab) *cepheid single result test* Anterior Nasal Swab     Status: None   Collection Time: 08/17/21  2:12 PM   Specimen: Anterior Nasal Swab  Result Value Ref Range   SARS Coronavirus 2 by RT PCR NEGATIVE NEGATIVE    Comment: (NOTE) SARS-CoV-2 target nucleic acids are NOT DETECTED.  The SARS-CoV-2 RNA is generally detectable in upper and lower respiratory specimens during the acute phase of infection. The lowest concentration of SARS-CoV-2 viral copies this assay can detect is 250 copies / mL. A negative result does not preclude SARS-CoV-2 infection and should not be used as the sole basis for treatment or other patient management decisions.  A negative result may occur with improper specimen collection / handling, submission of specimen other than nasopharyngeal swab, presence of viral mutation(s) within the areas targeted by this assay, and inadequate number of viral copies (<250 copies / mL). A negative result must be combined with clinical observations, patient history, and epidemiological information.  Fact Sheet for Patients:   https://www.patel.info/  Fact Sheet for Healthcare Providers: https://hall.com/  This test is not yet approved or  cleared by the [place] FDA and has been authorized for detection and/or diagnosis of SARS-CoV-2 by FDA under an Emergency Use Authorization (EUA).  This EUA will remain in effect (meaning this test can be used) for the duration of the COVID-19 declaration under Section 564(b)(1) of the Act, 21 U.S.C. section 360bbb-3(b)(1), unless the authorization is terminated or revoked sooner.  Performed at [facility], [place] [address] [place], [place] [address]   Lactic acid, plasma     Status: Abnormal   Collection Time: 08/17/21  2:18 PM  Result Value Ref  Range   Lactic Acid, Venous 3.3 (HH) 0.5 - 1.9 mmol/L    Comment: CRITICAL RESULT CALLED TO, READ BACK BY AND VERIFIED WITH: G.TATE,RN 08/17/2021 AT 1539 AHUGHES Performed at [facility], [place] [address], [place], [place] [address]   Comprehensive metabolic panel     Status: Abnormal   Collection Time: 08/17/21  2:18 PM  Result Value Ref Range   Sodium 135 135 - 145 mmol/L   Potassium 4.0 3.5 - 5.1 mmol/L   Chloride 99 98 - 111 mmol/L   CO2 23 22 - 32 mmol/L   Glucose, Bld 259 (H) 70 - 99 mg/dL    Comment: Glucose reference range applies only to samples taken after fasting for at least 8 hours.   BUN 8 8 - 23 mg/dL   Creatinine, Ser 1.17 (H) 0.44 - 1.00 mg/dL   Calcium 8.9 8.9 - 10.3 mg/dL   Total Protein 7.1 6.5 - 8.1 g/dL   Albumin 3.1 (L)  3.5 - 5.0 g/dL   AST 44 (H) 15 - 41 U/L   ALT 41 0 - 44 U/L   Alkaline Phosphatase 156 (H) 38 - 126 U/L   Total Bilirubin 2.7 (H) 0.3 - 1.2 mg/dL   GFR, Estimated 52 (L) >60 mL/min    Comment: (NOTE) Calculated using the CKD-EPI Creatinine Equation (2021)    Anion gap 13 5 - 15    Comment: Performed at [place] [address], [place], [place] [address]  CBC with Differential     Status: None   Collection Time: 08/17/21  2:18 PM  Result Value Ref Range   WBC 8.5 4.0 - 10.5 K/uL   RBC 4.57 3.87 - 5.11 MIL/uL   Hemoglobin 13.7 12.0 - 15.0 g/dL   HCT 41.6 36.0 - 46.0 %   MCV 91.0 80.0 - 100.0 fL   MCH 30.0 26.0 - 34.0 pg   MCHC 32.9 30.0 - 36.0 g/dL   RDW 12.5 11.5 - 15.5 %   Platelets 236 150 - 400 K/uL   nRBC 0.0 0.0 - 0.2 %   Neutrophils Relative % 71 %   Neutro Abs 6.1 1.7 - 7.7 K/uL   Lymphocytes Relative 15 %   Lymphs Abs 1.3 0.7 - 4.0 K/uL   Monocytes Relative 12 %   Monocytes Absolute 1.0 0.1 - 1.0 K/uL   Eosinophils Relative 0 %   Eosinophils Absolute 0.0 0.0 - 0.5 K/uL   Basophils Relative 1 %   Basophils Absolute 0.1 0.0 - 0.1 K/uL   Immature Granulocytes 1 %   Abs Immature Granulocytes 0.06 0.00 -  0.07 K/uL    Comment: Performed at [facility], [address], [place], [place] [address]  Lactic acid, plasma     Status: None   Collection Time: 08/17/21  4:08 PM  Result Value Ref Range   Lactic Acid, Venous 1.9 0.5 - 1.9 mmol/L    Comment: Performed at [facility] [address], [place], [place] [address]   DG Chest 2 View  Result Date: 08/17/2021 CLINICAL DATA:  Fever, headache, vomiting, photophobia, and congestion for 4 days EXAM: CHEST - 2 VIEW COMPARISON:  03/13/2021 FINDINGS: Normal heart size, mediastinal contours, and pulmonary vascularity. Subsegmental atelectasis LEFT base. Airspace infiltrate RIGHT lung base consistent with pneumonia. Upper lungs clear. No pleural effusion or pneumothorax. IMPRESSION: RIGHT basilar infiltrate consistent with pneumonia. Subsegmental atelectasis LEFT base. Electronically Signed   By: Lavonia Dana M.D.   On: 08/17/2021 14:46    Pending Labs Unresulted Labs (From admission, onward)     Start     Ordered   08/18/21 2440  Basic metabolic panel  Tomorrow morning,   R        08/17/21 1612   08/18/21 0500  CBC with Differential/Platelet  Tomorrow morning,   R        08/17/21 1612   08/17/21 1612  Strep pneumoniae urinary antigen  Once,   R        08/17/21 1612   08/17/21 1612  Legionella Pneumophila Serogp 1 Ur Ag  Once,   R        08/17/21 1612   08/17/21 1610  HIV Antibody (routine testing w rflx)  (HIV Antibody (Routine testing w reflex) panel)  Once,   R        08/17/21 1612   08/17/21 1548  Culture, blood (single)  (Undifferentiated -> Now sepsis confirmed (treatment and sepsis specific nursing  orders))  ONCE - STAT,   STAT        08/17/21 1548            Vitals/Pain Today's Vitals   08/17/21 1407 08/17/21 1554 08/17/21 1600 08/17/21 1604  BP:  110/75 115/76   Pulse:  81 87   Resp:  (!) 24 19   Temp:   99.4 F (37.4 C)   TempSrc:   Oral   SpO2:  93% 93%   PainSc: 8    5     Isolation Precautions Airborne and  Contact precautions  Medications Medications  azithromycin (ZITHROMAX) 500 mg in sodium chloride 0.9 % 250 mL IVPB (500 mg Intravenous New Bag/Given 08/17/21 1632)  cefTRIAXone (ROCEPHIN) 1 g in sodium chloride 0.9 % 100 mL IVPB (1 g Intravenous New Bag/Given 08/17/21 1555)  lactated ringers infusion (has no administration in time range)  lactated ringers bolus 1,000 mL (1,000 mLs Intravenous New Bag/Given 08/17/21 1628)    And  lactated ringers bolus 1,000 mL (has no administration in time range)    And  lactated ringers bolus 1,000 mL (has no administration in time range)  enoxaparin (LOVENOX) injection 40 mg (has no administration in time range)  acetaminophen (TYLENOL) tablet 650 mg (650 mg Oral Given 08/17/21 1419)  fentaNYL (SUBLIMAZE) injection 50 mcg (50 mcg Intravenous Given 08/17/21 1555)  metoCLOPramide (REGLAN) injection 5 mg (5 mg Intravenous Given 08/17/21 1554)  ketorolac (TORADOL) 15 MG/ML injection 15 mg (15 mg Intravenous Given 08/17/21 1627)    Mobility walks Low fall risk   Focused Assessments Cardiac Assessment Handoff:    No results found for: "CKTOTAL", "CKMB", "CKMBINDEX", "TROPONINI" No results found for: "DDIMER" Does the Patient currently have chest pain? No    R Recommendations: See Admitting Provider Note  Report given to:   Additional Notes:

## 2021-08-17 NOTE — ED Provider Notes (Signed)
[facility] EMERGENCY DEPARTMENT Provider Note   CSN: [id number] Arrival date & time: 08/17/21  1352     History  No chief complaint on file.   Heidi Chapman is a 66 y.o. female.  66 year old female who presents with cough congestion x4 days.  She notes increasing dyspnea on exertion.  She has had mild headache without neck pain.  She does note myalgias.  She endorses that she has had a fever.  She denies having any diarrhea.  Does not have any urinary symptoms..  Has been using over-the-counter medications without relief.  Does have a history of pneumonia in the past and is feel similar.       Home Medications Prior to Admission medications   Medication Sig Start Date End Date Taking? Authorizing Provider  amantadine (SYMMETREL) 100 MG capsule Take 1 capsule (100 mg total) by mouth daily. X 3 days, then 200 mg daily- for fatigue/attention 06/18/21   Lovorn, Jinny Blossom, MD  atorvastatin (LIPITOR) 10 MG tablet Take 1 tablet (10 mg total) by mouth daily. 05/21/21 05/21/22  Farrel Gordon, DO  cimetidine (TAGAMET) 200 MG tablet Take 200 mg by mouth as needed.    [provider]  DULoxetine (CYMBALTA) 30 MG capsule Take 1 capsule (30 mg total) by mouth at bedtime. Take 30 mg nightly x 1 week, then 60 mg nightly- for back/nerve pain 03/19/21   Lovorn, Jinny Blossom, MD  HUMIRA PEN 40 MG/0.4ML PNKT INJECT 1 PEN SUBCUTANEOUSLY EVERY 14 DAYS 07/07/21   Rice, Resa Miner, MD  Multiple Vitamins-Minerals (MULTIVITAMIN WOMEN 50+) TABS See admin instructions. 03/27/19   [provider]  naproxen (NAPROSYN) 500 MG tablet  11/12/20   [provider]  promethazine (PHENERGAN) 12.5 MG tablet Take 1 tablet (12.5 mg total) by mouth every 6 (six) hours as needed for nausea or vomiting. 03/19/21   Lovorn, Jinny Blossom, MD  traMADol (ULTRAM) 50 MG tablet Take 1 tablet (50 mg total) by mouth every 6 (six) hours as needed. 06/23/21   Lovorn, Jinny Blossom, MD      Allergies    Leflunomide, Peanut  butter flavor, and Latex    Review of Systems   Review of Systems  All other systems reviewed and are negative.   Physical Exam Updated Vital Signs BP 139/78   Pulse (!) 105   Temp (!) 101.2 F (38.4 C) (Oral)   Resp 18   SpO2 91%  Physical Exam Vitals and nursing note reviewed.  Constitutional:      General: She is not in acute distress.    Appearance: Normal appearance. She is well-developed. She is not toxic-appearing.  HENT:     Head: Normocephalic and atraumatic.  Eyes:     General: Lids are normal.     Conjunctiva/sclera: Conjunctivae normal.     Pupils: Pupils are equal, round, and reactive to light.  Neck:     Thyroid: No thyroid mass.     Trachea: No tracheal deviation.  Cardiovascular:     Rate and Rhythm: Regular rhythm. Tachycardia present.     Heart sounds: Normal heart sounds. No murmur heard.    No gallop.  Pulmonary:     Effort: Pulmonary effort is normal. No respiratory distress.     Breath sounds: Normal breath sounds. No stridor. No decreased breath sounds, wheezing, rhonchi or rales.  Abdominal:     General: There is no distension.     Palpations: Abdomen is soft.     Tenderness: There is no abdominal tenderness.  There is no rebound.  Musculoskeletal:        General: No tenderness. Normal range of motion.     Cervical back: Normal range of motion and neck supple.  Skin:    General: Skin is warm and dry.     Findings: No abrasion or rash.  Neurological:     Mental Status: She is alert and oriented to person, place, and time. Mental status is at baseline.     GCS: GCS eye subscore is 4. GCS verbal subscore is 5. GCS motor subscore is 6.     Cranial Nerves: No cranial nerve deficit.     Sensory: No sensory deficit.     Motor: Motor function is intact.  Psychiatric:        Attention and Perception: Attention normal.        Speech: Speech normal.        Behavior: Behavior normal.     ED Results / Procedures / Treatments   Labs (all labs  ordered are listed, but only abnormal results are displayed) Labs Reviewed  COMPREHENSIVE METABOLIC PANEL - Abnormal; Notable for the following components:      Result Value   Glucose, Bld 259 (*)    Creatinine, Ser 1.17 (*)    Albumin 3.1 (*)    AST 44 (*)    Alkaline Phosphatase 156 (*)    Total Bilirubin 2.7 (*)    GFR, Estimated 52 (*)    All other components within normal limits  URINALYSIS, ROUTINE W REFLEX MICROSCOPIC - Abnormal; Notable for the following components:   Color, Urine AMBER (*)    APPearance HAZY (*)    Glucose, UA >=500 (*)    Protein, ur 100 (*)    Leukocytes,Ua SMALL (*)    Bacteria, UA RARE (*)    All other components within normal limits  SARS CORONAVIRUS 2 BY RT PCR  CBC WITH DIFFERENTIAL/PLATELET  LACTIC ACID, PLASMA  LACTIC ACID, PLASMA    EKG ED ECG REPORT   Date: 08/17/2021  Rate: 105  Rhythm: sinus tachycardia  QRS Axis: normal  Intervals: normal  ST/T Wave abnormalities: normal  Conduction Disutrbances:none  Narrative Interpretation:   Old EKG Reviewed: none available  I have personally reviewed the EKG tracing and agree with the computerized printout as noted.   Radiology DG Chest 2 View  Result Date: 08/17/2021 CLINICAL DATA:  Fever, headache, vomiting, photophobia, and congestion for 4 days EXAM: CHEST - 2 VIEW COMPARISON:  03/13/2021 FINDINGS: Normal heart size, mediastinal contours, and pulmonary vascularity. Subsegmental atelectasis LEFT base. Airspace infiltrate RIGHT lung base consistent with pneumonia. Upper lungs clear. No pleural effusion or pneumothorax. IMPRESSION: RIGHT basilar infiltrate consistent with pneumonia. Subsegmental atelectasis LEFT base. Electronically Signed   By: Lavonia Dana M.D.   On: 08/17/2021 14:46    Procedures Procedures    Medications Ordered in ED Medications  fentaNYL (SUBLIMAZE) injection 50 mcg (has no administration in time range)  metoCLOPramide (REGLAN) injection 5 mg (has no  administration in time range)  azithromycin (ZITHROMAX) 500 mg in sodium chloride 0.9 % 250 mL IVPB (has no administration in time range)  cefTRIAXone (ROCEPHIN) 1 g in sodium chloride 0.9 % 100 mL IVPB (has no administration in time range)  acetaminophen (TYLENOL) tablet 650 mg (650 mg Oral Given 08/17/21 1419)    ED Course/ Medical Decision Making/ A&P  Medical Decision Making Amount and/or Complexity of Data Reviewed Labs: ordered. Radiology: ordered.  Risk OTC drugs. Prescription drug management.   Patient's EKG per my interpretation shows sinus tachycardia.  Chest x-ray per my interpretation consistent with pneumonia.  Patient has a lactate of 3.4.  Sepsis protocol initiated.  Patient will receive 30/kg of lactated Ringer's as well as started on IV antibiotics.  Urinalysis results noted and likely contaminated.  She has no leukocytosis on CBC.  She has no meningeal signs on physical exam.  Patient goes to the internal medicine clinic and will be admitted to the teaching service       Final Clinical Impression(s) / ED Diagnoses Final diagnoses:  None    Rx / DC Orders ED Discharge Orders     None         Lacretia Leigh, MD 08/17/21 1546

## 2021-08-17 NOTE — Hospital Course (Addendum)
She is feeling a lot better today. She was able to walk around the hall and was not short of breath. She is having some mild diarrhea after starting antibiotics. Otherwise, doing well.    CAP ?blood cultures 89% sat on RA while in the rom.  ?walk with nurse Continue monitoring in the PM AKI Resolving Encourage PO intake

## 2021-08-17 NOTE — Sepsis Progress Note (Signed)
Elink following code sepsis °

## 2021-08-17 NOTE — Progress Notes (Signed)
Pt does not wish to wear CPAP tonight. RT will cont to monitor as needed.

## 2021-08-18 DIAGNOSIS — G8929 Other chronic pain: Secondary | ICD-10-CM | POA: Diagnosis present

## 2021-08-18 DIAGNOSIS — G4733 Obstructive sleep apnea (adult) (pediatric): Secondary | ICD-10-CM

## 2021-08-18 DIAGNOSIS — Z8249 Family history of ischemic heart disease and other diseases of the circulatory system: Secondary | ICD-10-CM | POA: Diagnosis not present

## 2021-08-18 DIAGNOSIS — N179 Acute kidney failure, unspecified: Secondary | ICD-10-CM | POA: Diagnosis present

## 2021-08-18 DIAGNOSIS — M0579 Rheumatoid arthritis with rheumatoid factor of multiple sites without organ or systems involvement: Secondary | ICD-10-CM | POA: Diagnosis present

## 2021-08-18 DIAGNOSIS — Z87891 Personal history of nicotine dependence: Secondary | ICD-10-CM | POA: Diagnosis not present

## 2021-08-18 DIAGNOSIS — M797 Fibromyalgia: Secondary | ICD-10-CM | POA: Diagnosis present

## 2021-08-18 DIAGNOSIS — Z8371 Family history of colonic polyps: Secondary | ICD-10-CM | POA: Diagnosis not present

## 2021-08-18 DIAGNOSIS — Z79899 Other long term (current) drug therapy: Secondary | ICD-10-CM | POA: Diagnosis not present

## 2021-08-18 DIAGNOSIS — Z8042 Family history of malignant neoplasm of prostate: Secondary | ICD-10-CM | POA: Diagnosis not present

## 2021-08-18 DIAGNOSIS — J189 Pneumonia, unspecified organism: Secondary | ICD-10-CM | POA: Diagnosis present

## 2021-08-18 DIAGNOSIS — M0589 Other rheumatoid arthritis with rheumatoid factor of multiple sites: Secondary | ICD-10-CM | POA: Diagnosis not present

## 2021-08-18 DIAGNOSIS — M549 Dorsalgia, unspecified: Secondary | ICD-10-CM | POA: Diagnosis present

## 2021-08-18 DIAGNOSIS — Z9989 Dependence on other enabling machines and devices: Secondary | ICD-10-CM | POA: Diagnosis not present

## 2021-08-18 DIAGNOSIS — Z86010 Personal history of colonic polyps: Secondary | ICD-10-CM | POA: Diagnosis not present

## 2021-08-18 DIAGNOSIS — Z808 Family history of malignant neoplasm of other organs or systems: Secondary | ICD-10-CM | POA: Diagnosis not present

## 2021-08-18 DIAGNOSIS — A419 Sepsis, unspecified organism: Secondary | ICD-10-CM | POA: Diagnosis present

## 2021-08-18 DIAGNOSIS — Z20822 Contact with and (suspected) exposure to covid-19: Secondary | ICD-10-CM | POA: Diagnosis present

## 2021-08-18 LAB — CBC WITH DIFFERENTIAL/PLATELET
Abs Immature Granulocytes: 0.07 10*3/uL (ref 0.00–0.07)
Basophils Absolute: 0 10*3/uL (ref 0.0–0.1)
Basophils Relative: 0 %
Eosinophils Absolute: 0 10*3/uL (ref 0.0–0.5)
Eosinophils Relative: 0 %
HCT: 34.8 % — ABNORMAL LOW (ref 36.0–46.0)
Hemoglobin: 11.9 g/dL — ABNORMAL LOW (ref 12.0–15.0)
Immature Granulocytes: 1 %
Lymphocytes Relative: 16 %
Lymphs Abs: 1.1 10*3/uL (ref 0.7–4.0)
MCH: 30.5 pg (ref 26.0–34.0)
MCHC: 34.2 g/dL (ref 30.0–36.0)
MCV: 89.2 fL (ref 80.0–100.0)
Monocytes Absolute: 1 10*3/uL (ref 0.1–1.0)
Monocytes Relative: 15 %
Neutro Abs: 4.6 10*3/uL (ref 1.7–7.7)
Neutrophils Relative %: 68 %
Platelets: 189 10*3/uL (ref 150–400)
RBC: 3.9 MIL/uL (ref 3.87–5.11)
RDW: 12.7 % (ref 11.5–15.5)
WBC: 6.9 10*3/uL (ref 4.0–10.5)
nRBC: 0 % (ref 0.0–0.2)

## 2021-08-18 LAB — BASIC METABOLIC PANEL
Anion gap: 11 (ref 5–15)
BUN: 9 mg/dL (ref 8–23)
CO2: 24 mmol/L (ref 22–32)
Calcium: 8.5 mg/dL — ABNORMAL LOW (ref 8.9–10.3)
Chloride: 103 mmol/L (ref 98–111)
Creatinine, Ser: 0.93 mg/dL (ref 0.44–1.00)
GFR, Estimated: >60 mL/min (ref >60)
Glucose, Bld: 126 mg/dL — ABNORMAL HIGH (ref 70–99)
Potassium: 3.6 mmol/L (ref 3.5–5.1)
Sodium: 138 mmol/L (ref 135–145)

## 2021-08-18 LAB — HIV ANTIBODY (ROUTINE TESTING W REFLEX): HIV Screen 4th Generation wRfx: NONREACTIVE

## 2021-08-18 MED ORDER — CEFTRIAXONE SODIUM 1 G IJ SOLR: 1.0000 g | Freq: Once | INTRAMUSCULAR | Status: DC

## 2021-08-18 MED ORDER — SODIUM CHLORIDE 0.9 % IV SOLN
2.0000 g | INTRAVENOUS | Status: DC
  Administered 2021-08-18: 2 g via INTRAVENOUS
  Filled 2021-08-18: qty 20

## 2021-08-18 MED ORDER — SODIUM CHLORIDE 0.9 % IV SOLN: 2.0000 g | INTRAVENOUS | Status: DC

## 2021-08-18 MED ORDER — PNEUMOCOCCAL 20-VAL CONJ VACC 0.5 ML IM SUSY: 0.5000 mL | PREFILLED_SYRINGE | INTRAMUSCULAR | Status: DC | PRN

## 2021-08-18 MED ORDER — ACETAMINOPHEN 325 MG PO TABS
650.0000 mg | ORAL_TABLET | Freq: Four times a day (QID) | ORAL | Status: DC | PRN
Start: 2021-08-18 — End: 2021-08-19
  Administered 2021-08-18 (×2): 650 mg via ORAL
  Filled 2021-08-18 (×3): qty 2

## 2021-08-18 MED ORDER — SODIUM CHLORIDE 0.9 % IV SOLN
500.0000 mg | INTRAVENOUS | Status: DC
  Administered 2021-08-18: 500 mg via INTRAVENOUS
  Filled 2021-08-18: qty 5

## 2021-08-18 NOTE — Progress Notes (Signed)
   Subjective:   No acute overnight events.  She is feeling fine today, still feels weak though. Felt winded earlier when walking to bathroom. She is not hungry and has not eaten for the past 3 days.   Objective:  Vital signs in last 24 hours: Vitals:   08/18/21 0309 08/18/21 0739 08/18/21 1204 08/18/21 1545  BP: (!) 97/48 119/64 110/70 115/73  Pulse: 91  76 86  Resp: 18     Temp: 97.8 F (36.6 C) 97.8 F (36.6 C) 97.7 F (36.5 C) 97.8 F (36.6 C)  TempSrc: Oral Oral Oral Oral  SpO2: 91%  92% 92%   General: pleasant elderly female, resting in bed, NAD. CV: normal rate and regular rhythm, no m/r/g. Pulm: inspiratory crackles at right base. Abdomen: soft, nondistended, nontender, normoactive bowel sounds. MSK: no edema bilaterally. Neuro: AAOx3, no focal deficits noted. Psych: normal mood and affect.  Assessment/Plan:  Principal Problem:   Community acquired pneumonia Active Problems:   Rheumatoid arthritis involving multiple sites with positive rheumatoid factor (HCC)   Obstructive sleep apnea   Chronic bilateral back pain  Sepsis - RESOLVED Community Acquired PNA Feeling better overall, although still requiring 2L [place]. Afebrile and without leukocytosis. Will continue antibiotics and obtain ambulatory O2 saturations with and without O2. Lactate has cleared. Patient sepsis is now resolved. -continue rocephin (day 2/5) and azithromycin (day 2/3) -legionella urinary antigen and blood culture pending -encourage PO intake -encourage ambulation -ambulatory O2 sats with and without O2 -wean O2 as tolerated -plan to transition to oral abx tomorrow to finish course  AKI - resolved Patient presented with an AKI, likely pre-renal. This has resolved with IV hydration. Continue to encourage PO intake. -encourage PO intake   Prior to Admission Living Arrangement: Home Anticipated Discharge Location: Home Barriers to Discharge: continued medical management Dispo: Anticipated  discharge in approximately 1-2 day(s).   Virl Axe, MD 08/18/2021, 3:48 PM Pager: [phone] After 5pm on weekdays and 1pm on weekends: On Call pager [phone]

## 2021-08-18 NOTE — Progress Notes (Signed)
  Transition of Care [facility]) Screening Note   Patient Details  Name: Heidi Chapman Date of Birth: 1955-06-19   Transition of Care [facility]) CM/SW Contact:    Cyndi Bender, RN Phone Number: 08/18/2021, 8:33 AM    Transition of Care Department [facility]) has reviewed patient and no TOC needs have been identified at this time. We will continue to monitor patient advancement through interdisciplinary progression rounds. If new patient transition needs arise, please place a TOC consult.

## 2021-08-18 NOTE — Progress Notes (Signed)
Patient refused CPAP use 

## 2021-08-19 LAB — LEGIONELLA PNEUMOPHILA SEROGP 1 UR AG: L. pneumophila Serogp 1 Ur Ag: NEGATIVE

## 2021-08-19 MED ORDER — CEFDINIR 300 MG PO CAPS: 300.0000 mg | ORAL_CAPSULE | Freq: Two times a day (BID) | ORAL | 0 refills | Status: DC

## 2021-08-19 MED ORDER — SODIUM CHLORIDE 0.9 % IV SOLN
2.0000 g | INTRAVENOUS | Status: DC
  Administered 2021-08-19: 2 g via INTRAVENOUS
  Filled 2021-08-19: qty 20

## 2021-08-19 MED ORDER — SODIUM CHLORIDE 0.9 % IV SOLN
500.0000 mg | INTRAVENOUS | Status: AC
  Administered 2021-08-19: 500 mg via INTRAVENOUS
  Filled 2021-08-19: qty 5

## 2021-08-19 NOTE — Discharge Summary (Signed)
Name: Heidi Chapman MRN: [id number] DOB: [birth year] 66 y.o. PCP: Farrel Gordon, DO  Date of Admission: 08/17/2021  1:52 PM Date of Discharge: 08/19/2021 Attending Physician: Charise Killian, MD  Discharge Diagnosis: 1. Principal Problem:   Community acquired pneumonia Active Problems:   Rheumatoid arthritis involving multiple sites with positive rheumatoid factor ([place])   Obstructive sleep apnea   Chronic bilateral back pain   Discharge Medications: Allergies as of 08/19/2021       Reactions   Leflunomide Other (See Comments)   Peanut Butter Flavor Other (See Comments)   Latex Rash        Medication List     TAKE these medications    amantadine 100 MG capsule Commonly known as: SYMMETREL Take 1 capsule (100 mg total) by mouth daily. X 3 days, then 200 mg daily- for fatigue/attention What changed:  when to take this reasons to take this additional instructions   atorvastatin 10 MG tablet Commonly known as: Lipitor Take 1 tablet (10 mg total) by mouth daily.   cimetidine 200 MG tablet Commonly known as: TAGAMET Take 200 mg by mouth daily as needed (Acid reflux).   DULoxetine 30 MG capsule Commonly known as: Cymbalta Take 1 capsule (30 mg total) by mouth at bedtime. Take 30 mg nightly x 1 week, then 60 mg nightly- for back/nerve pain What changed: additional instructions   Humira Pen 40 MG/0.4ML Pnkt Generic drug: Adalimumab INJECT 1 PEN SUBCUTANEOUSLY EVERY 14 DAYS What changed: See the new instructions.   Multivitamin Women 50+ Tabs Take 1 tablet by mouth daily.   promethazine 12.5 MG tablet Commonly known as: PHENERGAN Take 1 tablet (12.5 mg total) by mouth every 6 (six) hours as needed for nausea or vomiting.   traMADol 50 MG tablet Commonly known as: Ultram Take 1 tablet (50 mg total) by mouth every 6 (six) hours as needed. What changed: reasons to take this        Disposition and follow-up:   Heidi Chapman was discharged from [facility] in Stable condition.  At the hospital follow up visit please address:  1.  CAP - Please ensure she has completed her 5 day course of cephalosporins with 2 days of cefdinir.  2.  Labs / imaging needed at time of follow-up: CBC with Differential  3.  Pending labs/ test needing follow-up: none  Follow-up Appointments:  Follow-up Information     Farrel Gordon, DO. Go in 1 week(s).   Specialty: Internal Medicine Contact information: [place] [address] [place] Hospital Course by problem list: 1. CAP - Patient presented with fever, congestion, progressive dyspnea, mild nonproductive cough, weakness along with CXR showing a R basilar infiltrate consistent with pneumonia. She was febrile on admission to 101.72F along with tachycardia. Lactate initially elevated to 3.3 but has cleared in response to IVF. Patient did meet sepsis criteria and per protocol received aggressive IVF along with empiric CAP treatment with rocephin and azithromycin. Fever resolved with antibiotics. Required 2L nasal [place]ula through day 2 of admission. Upon discharge, patient able to ambulate on RA with SpO2 in low 90s. Patient will have completed 3/5 days of rocephin and 3/3 days of azithro today. She will complete abx course with 2 days of cefdinir.  AKI - Presented with AKI thought to be prerenal in nature. Resolved with IV fluids and PO intake.  Discharge Subjective:  She is feeling a lot better today. She was able to walk around the hall and was not short of breath. She is having some mild diarrhea after starting antibiotics. Otherwise, doing well.  Discharge Exam:   BP 108/70 (BP Location: Left Arm)   Pulse 91   Temp 99.8 F (37.7 C) (Oral)   Resp 16   SpO2 93%  Discharge exam:   Physical Exam HENT:     Head: Normocephalic.     Mouth/Throat:     Mouth: Mucous membranes are moist.  Eyes:     Extraocular Movements: Extraocular movements  intact.  Cardiovascular:     Rate and Rhythm: Normal rate and regular rhythm.  Pulmonary:     Comments: Crackles at right lung base improved from yesterday. Skin:    General: Skin is warm and dry.  Neurological:     Mental Status: She is alert and oriented to person, place, and time.    Pertinent Labs, Studies, and Procedures:     Latest Ref Rng & Units 08/18/2021   12:18 AM 08/17/2021    2:18 PM 06/04/2021   12:06 PM  CBC  WBC 4.0 - 10.5 K/uL 6.9  8.5  6.9   Hemoglobin 12.0 - 15.0 g/dL 11.9  13.7  16.4   Hematocrit 36.0 - 46.0 % 34.8  41.6  48.2   Platelets 150 - 400 K/uL 189  236  262        Latest Ref Rng & Units 08/18/2021   12:18 AM 08/17/2021    2:18 PM 06/04/2021   12:06 PM  CMP  Glucose 70 - 99 mg/dL 126  259  117   BUN 8 - 23 mg/dL '9  8  13   '$ Creatinine 0.44 - 1.00 mg/dL 0.93  1.17  0.93   Sodium 135 - 145 mmol/L 138  135  142   Potassium 3.5 - 5.1 mmol/L 3.6  4.0  4.2   Chloride 98 - 111 mmol/L 103  99  108   CO2 22 - 32 mmol/L '24  23  23   '$ Calcium 8.9 - 10.3 mg/dL 8.5  8.9  9.9   Total Protein 6.5 - 8.1 g/dL  7.1  7.3   Total Bilirubin 0.3 - 1.2 mg/dL  2.7  1.2   Alkaline Phos 38 - 126 U/L  156    AST 15 - 41 U/L  44  29   ALT 0 - 44 U/L  41  35    DG Chest 2 View  Result Date: 08/17/2021 CLINICAL DATA:  Fever, headache, vomiting, photophobia, and congestion for 4 days EXAM: CHEST - 2 VIEW COMPARISON:  03/13/2021 FINDINGS: Normal heart size, mediastinal contours, and pulmonary vascularity. Subsegmental atelectasis LEFT base. Airspace infiltrate RIGHT lung base consistent with pneumonia. Upper lungs clear. No pleural effusion or pneumothorax. IMPRESSION: RIGHT basilar infiltrate consistent with pneumonia. Subsegmental atelectasis LEFT base. Electronically Signed   By: Lavonia Dana M.D.   On: 08/17/2021 14:46    Discharge Instructions:  Discharge Instructions     Diet - low sodium heart healthy   Complete by: As directed    Discharge instructions   Complete by: As  directed    Heidi Chapman, you came in with cough, fever, and difficulty breathing and were found to have community acquired pneumonia. You were treated with antibiotics and your fever resolved and breathing difficulty improved. -please collect your antibiotic (cefdinir) from your pharmacy and take for 2 more days after discharge  -please schedule  an appointment to follow up with your PCP, Farrel Gordon in 1 week   Increase activity slowly   Complete by: As directed        Signed: Linward Natal, MD 08/19/2021, 2:11 PM   Pager: [phone]

## 2021-08-20 ENCOUNTER — Telehealth: Payer: Self-pay

## 2021-08-20 NOTE — Telephone Encounter (Signed)
Transition Care Management Follow-up Telephone Call Date of discharge and from where: [facility] 08/17/21-08/19/21 How have you been since you were released from the hospital? Patient notes she is feeling much better today and she has more energy. Any questions or concerns? No  Items Reviewed: Did the pt receive and understand the discharge instructions provided? Yes  Medications obtained and verified? No - Patient states she is going this afternoon to pick up prescription, it is ready at pharmacy.  Discussed the importance of completing course of antibiotics. Other? No  Any new allergies since your discharge? No  Dietary orders reviewed? Yes Do you have support at home? Yes - Daughter and granddaughter live with patient.  Home Care and Equipment/Supplies: Were home health services ordered? no If so, what is the name of the agency? N/A  Has the agency set up a time to come to the patient's home? not applicable Were any new equipment or medical supplies ordered?  No What is the name of the medical supply agency? N/A Were you able to get the supplies/equipment? not applicable Do you have any questions related to the use of the equipment or supplies? No  Functional Questionnaire: (I = Independent and D = Dependent) ADLs: I  Bathing/Dressing- I  Meal Prep- I  Eating- I  Maintaining continence- I  Transferring/Ambulation- I  Managing Meds- I  Follow up appointments reviewed:  PCP Hospital f/u appt confirmed? Yes  Scheduled to see Dr. Johnney Ou on 08/26/21 @ 0245. [facility] f/u appt confirmed? Yes  Scheduled to see Dr. Dagoberto Ligas on 09/17/21 @ 0940. Are transportation arrangements needed? No  If their condition worsens, is the pt aware to call PCP or go to the Emergency Dept.? Yes Was the patient provided with contact information for the PCP's office or ED? Yes Was to pt encouraged to call back with questions or concerns? Yes

## 2021-08-22 LAB — CULTURE, BLOOD (SINGLE)
Culture: NO GROWTH
Special Requests: ADEQUATE

## 2021-08-26 ENCOUNTER — Other Ambulatory Visit: Payer: Self-pay

## 2021-08-26 ENCOUNTER — Encounter: Payer: Self-pay | Admitting: Student

## 2021-08-26 ENCOUNTER — Ambulatory Visit (INDEPENDENT_AMBULATORY_CARE_PROVIDER_SITE_OTHER): Payer: 59 | Admitting: Student

## 2021-08-26 VITALS — BP 102/60 | HR 83 | Temp 97.6°F | Ht 65.0 in | Wt 181.9 lb

## 2021-08-26 DIAGNOSIS — J189 Pneumonia, unspecified organism: Secondary | ICD-10-CM | POA: Diagnosis not present

## 2021-08-26 DIAGNOSIS — D649 Anemia, unspecified: Secondary | ICD-10-CM | POA: Diagnosis not present

## 2021-08-26 DIAGNOSIS — Z87891 Personal history of nicotine dependence: Secondary | ICD-10-CM | POA: Diagnosis not present

## 2021-08-26 NOTE — Assessment & Plan Note (Signed)
Assessment: On final lab work from hospitalization, Hgb of 11 down from 13. Will repeat today to ensure resolution. If continued to be anemic and not needing immediate evaluation, would recommend further evaluation with iron studies, retic count during follow up with PCP.   Plan: -CBC today  Addendum:  Follow up blood work with resolution of her anemia.

## 2021-08-26 NOTE — Assessment & Plan Note (Signed)
Heidi Chapman was recently admitted for right lower lobe pneumonia after presenting to the ED w/ fever, congestion, progressive dyspnea, mild nonproductive cough, weakness along with CXR showing a R basilar infiltrate consistent with pneumonia. Treated with rocephin and azithromycin. Did require supplemental oxygen, but this was discontinued at discharge and did not require home oxygen therapy. Completed two days of cefidinr at discharge. Denies increasing shortness of breath or feeling she is unable to perform any ADL/iADL. She is feeling much better and doing well.

## 2021-08-26 NOTE — Progress Notes (Signed)
CC: Hospital follow up - Pneumonia  HPI:  Heidi Chapman is a 66 y.o. female living with a history stated below and presents today for hospital follow up after being diagnosed with RLL pneumonia. Please see problem based assessment and plan for additional details.  Past Medical History:  Diagnosis Date   Absence of both cervix and uterus, acquired 12/14/2011   Acalculous cholecystitis 10/23/2017   Acquired deformity of right hand 03/13/2021   Acute left-sided low back pain without sciatica 03/13/2019   Anxiety    Anxiety 01/26/2019   Arthritis    Benign neoplasm of colon 12/14/2011   Fatigue 06/28/2020   Fibromyalgia    GERD (gastroesophageal reflux disease)    Healthcare maintenance 12/06/2017   High risk medication use 03/13/2021   Lymphadenopathy 03/13/2021   Migraine    Neck pain 03/13/2021   Orthopnea 08/29/2020   Overweight 03/13/2021   Polyarthralgia 01/26/2019   Screening for colon cancer 06/28/2020   Screening mammogram for breast cancer 06/28/2020   Shortness of breath at rest 08/29/2020   Snoring 08/29/2020    Current Outpatient Medications on File Prior to Visit  Medication Sig Dispense Refill   amantadine (SYMMETREL) 100 MG capsule Take 1 capsule (100 mg total) by mouth daily. X 3 days, then 200 mg daily- for fatigue/attention (Patient taking differently: Take 100 mg by mouth daily as needed (For fatugue).) 60 capsule 5   atorvastatin (LIPITOR) 10 MG tablet Take 1 tablet (10 mg total) by mouth daily. 30 tablet 11   cefdinir (OMNICEF) 300 MG capsule Take 1 capsule (300 mg total) by mouth 2 (two) times daily. 4 capsule 0   cimetidine (TAGAMET) 200 MG tablet Take 200 mg by mouth daily as needed (Acid reflux).     DULoxetine (CYMBALTA) 30 MG capsule Take 1 capsule (30 mg total) by mouth at bedtime. Take 30 mg nightly x 1 week, then 60 mg nightly- for back/nerve pain (Patient taking differently: Take 30 mg by mouth at bedtime.) 60 capsule 3   HUMIRA PEN 40 MG/0.4ML PNKT  INJECT 1 PEN SUBCUTANEOUSLY EVERY 14 DAYS (Patient taking differently: Take 1 tablet by mouth daily.) 6 each 0   Multiple Vitamins-Minerals (MULTIVITAMIN WOMEN 50+) TABS Take 1 tablet by mouth daily.     promethazine (PHENERGAN) 12.5 MG tablet Take 1 tablet (12.5 mg total) by mouth every 6 (six) hours as needed for nausea or vomiting. 90 tablet 1   traMADol (ULTRAM) 50 MG tablet Take 1 tablet (50 mg total) by mouth every 6 (six) hours as needed. (Patient taking differently: Take 50 mg by mouth every 6 (six) hours as needed for moderate pain.) 120 tablet 5   No current facility-administered medications on file prior to visit.    Family History  Problem Relation Age of Onset   Cancer - Lung Mother    Brain cancer Mother    Colon polyps Father    Prostate cancer Father    Diverticulitis Father    Heart attack Father    Cancer Maternal Grandmother    Cancer Maternal Grandfather    Cancer Paternal Grandfather     Social History   Socioeconomic History   Marital status: Legally Separated    Spouse name: Not on file   Number of children: Not on file   Years of education: Not on file   Highest education level: Not on file  Occupational History   Not on file  Tobacco Use   Smoking status: Former  Packs/day: 0.50    Years: 40.00    Total pack years: 20.00    Types: Cigarettes    Quit date: 02/14/2021    Years since quitting: 0.5    Passive exposure: Past   Smokeless tobacco: Never  Vaping Use   Vaping Use: Never used  Substance and Sexual Activity   Alcohol use: Yes    Comment: 1-3 drinks annually   Drug use: Never   Sexual activity: Not on file  Other Topics Concern   Not on file  Social History Narrative   Lives with youngest daughter and granddaughter   Right handed   Caffeine: 1 cup of coffee and 1 soda daily   Social Determinants of Health   Financial Resource Strain: Not on file  Food Insecurity: Not on file  Transportation Needs: Not on file  Physical  Activity: Not on file  Stress: Not on file  Social Connections: Not on file  Intimate Partner Violence: Not on file    Review of Systems: ROS negative except for what is noted on the assessment and plan.  Vitals:   08/26/21 1431  BP: 102/60  Pulse: 83  Temp: 97.6 F (36.4 C)  TempSrc: Oral  SpO2: 96%  Weight: 181 lb 14.4 oz (82.5 kg)  Height: '5\' 5"'$  (1.651 m)    Physical Exam: Constitutional: in no acute distress HENT: normocephalic atraumatic Eyes: conjunctiva non-erythematous Neck: supple Cardiovascular: regular rate and rhythm, no m/r/g Pulmonary/Chest: normal work of breathing on room air, lungs clear to auscultation bilaterally MSK: normal bulk and tone Neurological: alert & oriented x 3 Skin: warm and dry Psych: normal mood  Assessment & Plan:   Community acquired pneumonia Heidi Chapman was recently admitted for right lower lobe pneumonia after presenting to the ED w/ fever, congestion, progressive dyspnea, mild nonproductive cough, weakness along with CXR showing a R basilar infiltrate consistent with pneumonia. Treated with rocephin and azithromycin. Did require supplemental oxygen, but this was discontinued at discharge and did not require home oxygen therapy. Completed two days of cefidinr at discharge. Denies increasing shortness of breath or feeling she is unable to perform any ADL/iADL. She is feeling much better and doing well.   Normocytic anemia Assessment: On final lab work from hospitalization, Hgb of 11 down from 13. Will repeat today to ensure resolution. If continued to be anemic and not needing immediate evaluation, would recommend further evaluation with iron studies, retic count during follow up with PCP.   Plan: -CBC today  Patient discussed with Dr. Caffie Damme, D.O. [facility], PGY-3 Phone: [phone] Date 08/26/2021 Time 11:05 PM

## 2021-08-26 NOTE — Patient Instructions (Signed)
Thank you, [name] for allowing [place] to provide your care today. Today we discussed.  Hospital Follow Up - Pneumonia I am glad you are doing well, I expect your shortness of breath to continue to improve. Please follow up with Dr. Marlou Sa on the [facility].   Metallic Taste in Mouth I am glad this is doing better, please continue to monitor this and if still present please discuss at your follow up with Dr. Marlou Sa    I have ordered the following labs for you:  Lab Orders  No laboratory test(s) ordered today    Referrals ordered today:   Referral Orders  No referral(s) requested today     I have ordered the following medication/changed the following medications:   Stop the following medications: There are no discontinued medications.   Start the following medications: No orders of the defined types were placed in this encounter.    Follow up:  2 weeks with Dr. Marlou Sa     Should you have any questions or concerns please call the internal medicine clinic at [phone].    Sanjuana Letters, D.O. [place]

## 2021-08-27 ENCOUNTER — Encounter: Payer: Self-pay | Admitting: Student

## 2021-08-27 LAB — CBC
Hematocrit: 40.4 % (ref 34.0–46.6)
Hemoglobin: 13.6 g/dL (ref 11.1–15.9)
MCH: 30 pg (ref 26.6–33.0)
MCHC: 33.7 g/dL (ref 31.5–35.7)
MCV: 89 fL (ref 79–97)
Platelets: 354 10*3/uL (ref 150–450)
RBC: 4.54 x10E6/uL (ref 3.77–5.28)
RDW: 13.1 % (ref 11.7–15.4)
WBC: 7.9 10*3/uL (ref 3.4–10.8)

## 2021-08-27 NOTE — Progress Notes (Signed)
Internal Medicine Clinic Attending  Case discussed with Dr. Katsadouros  At the time of the visit.  We reviewed the resident's history and exam and pertinent patient test results.  I agree with the assessment, diagnosis, and plan of care documented in the resident's note.  

## 2021-09-15 ENCOUNTER — Other Ambulatory Visit: Payer: Self-pay

## 2021-09-15 ENCOUNTER — Encounter: Payer: Self-pay | Admitting: Internal Medicine

## 2021-09-15 ENCOUNTER — Ambulatory Visit (INDEPENDENT_AMBULATORY_CARE_PROVIDER_SITE_OTHER): Payer: 59 | Admitting: Internal Medicine

## 2021-09-15 VITALS — BP 125/64 | HR 82 | Temp 98.2°F | Ht 65.0 in | Wt 177.2 lb

## 2021-09-15 DIAGNOSIS — E785 Hyperlipidemia, unspecified: Secondary | ICD-10-CM | POA: Diagnosis not present

## 2021-09-15 DIAGNOSIS — F418 Other specified anxiety disorders: Secondary | ICD-10-CM | POA: Diagnosis not present

## 2021-09-15 NOTE — Patient Instructions (Addendum)
Heidi Chapman,  It was great to see you again! I am glad that you and your daughter at last had success in purchasing a home and moving in. I am also so glad that you are recovering well from your recent bout of pneumonia!  I am going to recheck your cholesterol levels today to see if they have improved from several months ago. If your cholesterol has gotten worse I will call you and we can talk about increasing your atorvastatin dose.  I would like you to follow up in 6 months or sooner if needed.  My best, Dr. Marlou Sa

## 2021-09-15 NOTE — Assessment & Plan Note (Signed)
Patient seems a lot more hopeful overall today and states, "the end is in sight" in regards to the stress related to several months of searching for a house and moving.  She says that she and her daughter have moved into a new home and that almost all the work was done solely by the 2 of them.  The amount of stress and anxiety with depression that she is experiencing now is significantly decreased, almost none, compared to prior. Assessment: Patient seems stable from a mental health standpoint at this time. Plan: Continue to monitor and support as indicated.

## 2021-09-15 NOTE — Assessment & Plan Note (Signed)
Patient was started on atorvastatin in April of this year due to LDL of 126 and overall ASCVD risk of 4.1% at that time.  That her overall risk is not significantly elevated, her medical history is complicated by rheumatoid arthritis.  She has not had significant changes in her diet since starting this medication, nor has she had consistent exercise changes. Plan: Recheck lipid panel today.  ADDENDUM: Lipid panel showed improvement overall of cholesterol levels. LDL is now 62. Will continue with lifestyle modifications at this time.

## 2021-09-15 NOTE — Progress Notes (Signed)
CC: routine follow-up  HPI:  [name] is a 66 y.o. female with past medical history as detailed below who presents today with for routine checkup.  She has been gradually recovering from a recent hospital admission for pneumonia during which time she was found to have an acute anemia as well as dyspnea, both of which have since resolved.  Past Medical History:  Diagnosis Date   Absence of both cervix and uterus, acquired 12/14/2011   Acalculous cholecystitis 10/23/2017   Acquired deformity of right hand 03/13/2021   Acute left-sided low back pain without sciatica 03/13/2019   Anxiety    Anxiety 01/26/2019   Arthritis    Benign neoplasm of colon 12/14/2011   Fatigue 06/28/2020   Fibromyalgia    GERD (gastroesophageal reflux disease)    Healthcare maintenance 12/06/2017   High risk medication use 03/13/2021   Lymphadenopathy 03/13/2021   Migraine    Neck pain 03/13/2021   Orthopnea 08/29/2020   Overweight 03/13/2021   Polyarthralgia 01/26/2019   Screening for colon cancer 06/28/2020   Screening mammogram for breast cancer 06/28/2020   Shortness of breath at rest 08/29/2020   Snoring 08/29/2020   Review of Systems:   Review of Systems  Constitutional:  Positive for weight loss. Negative for chills and fever.  Respiratory:  Negative for cough and shortness of breath.   Cardiovascular:  Negative for chest pain, palpitations and leg swelling.  Gastrointestinal:  Negative for abdominal pain.  Genitourinary:  Negative for frequency and urgency.  Musculoskeletal:  Negative for falls and myalgias.  Neurological:  Negative for dizziness, weakness and headaches.  Endo/Heme/Allergies:  Positive for polydipsia.  Psychiatric/Behavioral:  Negative for depression. The patient is not nervous/anxious.     Physical Exam:  Vitals:   09/15/21 0924  BP: 125/64  Pulse: 82  Temp: 98.2 F (36.8 C)  TempSrc: Oral  SpO2: 98%  Weight: 177 lb 3.2 oz (80.4 kg)  Height: '5\' 5"'$  (1.651 m)    Constitutional: Well-appearing female in no acute distress. Cardio: Regular rate and rhythm.  No murmurs, rubs, gallops. Pulm: Clear to auscultation bilaterally.  Normal work of breathing on room air. MSK: Negative for extremity edema.  No ulnar deviation of the fingers or joint swelling. Skin: Warm and dry.  No rashes or lesions on limited skin exam. Neuro: Alert and oriented x3.  No focal deficit noted. Psych: Pleasant mood and affect.  Assessment & Plan:   See Encounters Tab for problem based charting.  Hyperlipidemia Patient was started on atorvastatin in April of this year due to LDL of 126 and overall ASCVD risk of 4.1% at that time.  That her overall risk is not significantly elevated, her medical history is complicated by rheumatoid arthritis.  She has not had significant changes in her diet since starting this medication, nor has she had consistent exercise changes. Plan: Recheck lipid panel today.  Depression with anxiety Patient seems a lot more hopeful overall today and states, "the end is in sight" in regards to the stress related to several months of searching for a house and moving.  She says that she and her daughter have moved into a new home and that almost all the work was done solely by the 2 of them.  The amount of stress and anxiety with depression that she is experiencing now is significantly decreased, almost none, compared to prior. Assessment: Patient seems stable from a mental health standpoint at this time. Plan: Continue to monitor and support as  indicated.   Patient discussed with Dr. Heber [place]

## 2021-09-16 ENCOUNTER — Other Ambulatory Visit: Payer: Self-pay | Admitting: Internal Medicine

## 2021-09-16 DIAGNOSIS — Z79899 Other long term (current) drug therapy: Secondary | ICD-10-CM

## 2021-09-16 DIAGNOSIS — M0579 Rheumatoid arthritis with rheumatoid factor of multiple sites without organ or systems involvement: Secondary | ICD-10-CM

## 2021-09-16 LAB — LIPID PANEL
Chol/HDL Ratio: 2.5 ratio (ref 0.0–4.4)
Cholesterol, Total: 132 mg/dL (ref 100–199)
HDL: 52 mg/dL (ref >39)
LDL Chol Calc (NIH): 62 mg/dL (ref 0–99)
Triglycerides: 95 mg/dL (ref 0–149)
VLDL Cholesterol Cal: 18 mg/dL (ref 5–40)

## 2021-09-16 NOTE — Telephone Encounter (Signed)
Next Visit: 11/06/2021  Last Visit: 08/06/2021  Last Fill: 07/07/2021  UO:[id number] RA  Current Dose per office note 07/07/2021: Humira Pen 40 mg/0.44m pnkt  Labs: 08/26/2021 CBC WNL   TB Gold: 03/13/2021 Neg  Okay to refill Humira?

## 2021-09-17 ENCOUNTER — Encounter
Payer: Commercial Managed Care - HMO | Attending: Physical Medicine and Rehabilitation | Admitting: Physical Medicine and Rehabilitation

## 2021-09-17 ENCOUNTER — Encounter: Payer: Self-pay | Admitting: Physical Medicine and Rehabilitation

## 2021-09-17 VITALS — BP 138/84 | HR 78 | Ht 65.0 in | Wt 177.2 lb

## 2021-09-17 DIAGNOSIS — R5382 Chronic fatigue, unspecified: Secondary | ICD-10-CM

## 2021-09-17 DIAGNOSIS — M5416 Radiculopathy, lumbar region: Secondary | ICD-10-CM | POA: Diagnosis present

## 2021-09-17 DIAGNOSIS — R5383 Other fatigue: Secondary | ICD-10-CM

## 2021-09-17 DIAGNOSIS — M0579 Rheumatoid arthritis with rheumatoid factor of multiple sites without organ or systems involvement: Secondary | ICD-10-CM | POA: Diagnosis present

## 2021-09-17 DIAGNOSIS — E559 Vitamin D deficiency, unspecified: Secondary | ICD-10-CM | POA: Diagnosis present

## 2021-09-17 MED ORDER — CYCLOBENZAPRINE HCL 10 MG PO TABS: 10.0000 mg | ORAL_TABLET | Freq: Two times a day (BID) | ORAL | 1 refills | Status: DC | PRN

## 2021-09-17 MED ORDER — AMANTADINE HCL 100 MG PO CAPS
100.0000 mg | ORAL_CAPSULE | Freq: Every day | ORAL | 1 refills | Status: DC
Start: 2021-09-17 — End: 2022-04-17

## 2021-09-17 MED ORDER — TRAMADOL HCL 50 MG PO TABS: 50.0000 mg | ORAL_TABLET | Freq: Four times a day (QID) | ORAL | 5 refills | Status: DC | PRN

## 2021-09-17 NOTE — Progress Notes (Signed)
Subjective:    Patient ID: Heidi Chapman, female    DOB: [birth year], 66 y.o.   MRN: [id number]  HPI  Pt is a 66 yr old female with hx of RA- hands, elbows, shoulders and neck-  DJD of "entire back"; borderline diabetes; GERD; no HTN. Also has scoliosis 15-20 degrees thoracolumbar and lumbar radiculopathy based on xray results. Also has Fibromyalgia-    Here for f/u on chronic back pain and RA chronic pain.  Tramadol is helping som,e but has been overdoing- usually taking 3 pills/day up until 2 days ago and 1-2 tabs/day last 2 days.  Because not doing so much - in new house, trying ot get sorted out.  Has been very helpful- 50-60% better  Been in the process of moving into new house for last 1 month and doing too much daily.   Amantadine- can focus better- more motivated- easier to get up to do things.  Hasn't felt a great big change in energy.   Has some flexeril from 2022- is also really helpful- prescribed in past.  In 1 hour, could relax/calm down with tramadol and Flexeril.  Occ takes phenergan when this combo caused nausea.   Some nights, "cocktail" isn't enough when moving.   Pain much better before started moving.  Tiredness/exhaustion is still a big issue. Like gravity pulling her into ground.   Was in hospital for pneumonia for 2-3 days 2 weeks ago.  Bacterial pneumonia.    Pain Inventory Average Pain 7 Pain Right Now 4 My pain is sharp, burning, stabbing, and aching  In the last 24 hours, has pain interfered with the following? General activity 9 Relation with others 5 Enjoyment of life 5 What TIME of day is your pain at its worst? daytime and night Sleep (in general) Poor  Pain is worse with: walking, bending, sitting, standing, and some activites Pain improves with: rest, pacing activities, and medication Relief from Meds: 6  Family History  Problem Relation Age of Onset   Cancer - Lung Mother    Brain cancer Mother    Colon polyps Father     Prostate cancer Father    Diverticulitis Father    Heart attack Father    Cancer Maternal Grandmother    Cancer Maternal Grandfather    Cancer Paternal Grandfather    Social History   Socioeconomic History   Marital status: Legally Separated    Spouse name: Not on file   Number of children: Not on file   Years of education: Not on file   Highest education level: Not on file  Occupational History   Not on file  Tobacco Use   Smoking status: Former    Packs/day: 0.50    Years: 40.00    Total pack years: 20.00    Types: Cigarettes    Quit date: 02/14/2021    Years since quitting: 0.5    Passive exposure: Past   Smokeless tobacco: Never  Vaping Use   Vaping Use: Never used  Substance and Sexual Activity   Alcohol use: Yes    Comment: 1-3 drinks annually   Drug use: Never   Sexual activity: Not on file  Other Topics Concern   Not on file  Social History Narrative   Lives with youngest daughter and granddaughter   Right handed   Caffeine: 1 cup of coffee and 1 soda daily   Social Determinants of Health   Financial Resource Strain: Not on file  Food Insecurity: Not on file  Transportation Needs: Not on file  Physical Activity: Not on file  Stress: Not on file  Social Connections: Not on file   Past Surgical History:  Procedure Laterality Date   CESAREAN SECTION     CHOLECYSTECTOMY N/A 10/24/2017   Procedure: [place] CHOLANGIOGRAM;  Surgeon: Erroll Luna, MD;  Location: [facility];  Service: General;  Laterality: N/A;   GALLBLADDER SURGERY     TUBAL LIGATION     Past Surgical History:  Procedure Laterality Date   CESAREAN SECTION     CHOLECYSTECTOMY N/A 10/24/2017   Procedure: LAPAROSCOPIC CHOLECYSTECTOMY WITH INTRAOPERATIVE CHOLANGIOGRAM;  Surgeon: Erroll Luna, MD;  Location: [facility];  Service: General;  Laterality: N/A;   GALLBLADDER SURGERY     TUBAL LIGATION     Past Medical History:  Diagnosis Date   Absence of  both cervix and uterus, acquired 12/14/2011   Acalculous cholecystitis 10/23/2017   Acquired deformity of right hand 03/13/2021   Acute left-sided low back pain without sciatica 03/13/2019   Anxiety    Anxiety 01/26/2019   Arthritis    Benign neoplasm of colon 12/14/2011   Fatigue 06/28/2020   Fibromyalgia    GERD (gastroesophageal reflux disease)    Healthcare maintenance 12/06/2017   High risk medication use 03/13/2021   Lymphadenopathy 03/13/2021   Migraine    Neck pain 03/13/2021   Orthopnea 08/29/2020   Overweight 03/13/2021   Polyarthralgia 01/26/2019   Screening for colon cancer 06/28/2020   Screening mammogram for breast cancer 06/28/2020   Shortness of breath at rest 08/29/2020   Snoring 08/29/2020   BP 138/84   Pulse 78   Ht '5\' 5"'$  (1.651 m)   Wt 177 lb 3.2 oz (80.4 kg)   SpO2 91%   BMI 29.49 kg/m   Opioid Risk Score:   Fall Risk Score:  `1  Depression screen [facility] 2/9     09/15/2021    9:27 AM 08/26/2021    2:37 PM 06/18/2021   11:00 AM 03/19/2021    9:35 AM 06/28/2020   12:08 PM 12/14/2019   10:48 AM 11/30/2019   11:24 AM  Depression screen [facility] 2/9  Decreased Interest 0 0 0 0 1 0 0  Down, Depressed, Hopeless 0 0 0 0 0 0 0  PHQ - 2 Score 0 0 0 0 1 0 0  Altered sleeping    3 3  0  Tired, decreased energy    3 3 0 0  Change in appetite    1 1 0 0  Feeling bad or failure about yourself     0 0 0 0  Trouble concentrating    3 0 0 0  Moving slowly or fidgety/restless    0 0 0 0  Suicidal thoughts    0 0 0 0  PHQ-9 Score    10 8  0  Difficult doing work/chores    Somewhat difficult Somewhat difficult  Not difficult at all     Review of Systems  Constitutional: Negative.   HENT: Negative.    Eyes: Negative.   Respiratory: Negative.    Cardiovascular: Negative.   Gastrointestinal: Negative.   Endocrine: Negative.   Genitourinary: Negative.   Musculoskeletal:  Positive for back pain.  Skin: Negative.   Allergic/Immunologic: Negative.   Neurological: Negative.    Hematological: Negative.   Psychiatric/Behavioral: Negative.        Objective:   Physical Exam  Awake, alert, appears fatigued still; accompanied by granddaughter, NAD B/L hand changes-  chronic TTP in band across low back Tender points less obvious , but still notable on exam      Assessment & Plan:   Pt is a 66 yr old female with hx of RA- hands, elbows, shoulders and neck-  DJD of "entire back"; borderline diabetes; GERD; no HTN. Also has scoliosis 15-20 degrees thoracolumbar and lumbar radiculopathy based on xray results. Also has Fibromyalgia-    Here for f/u on chronic back pain and RA chronic pain.  Due to severe fatigue which is common with RA, however could also be due to thyroid or Vit D Deficiency issues, will check TSH and Vit D level- so know if have to replete levels. Hasn't been checke din years per computer.   Con't Tramadol- 50 mg q6 hours prn #120- 5 refills.   3. Con't Amantadine - helps specifically with fogginess/more focus.  4. Will get Vit D and TSH level and prescribe meds as required, based on results.   5. Start Flexeril/Cyclobenzaprine 10 mg 2x/day as needed for muscle spasms when pain is severe.    6. F/U in 3 months On RA and LBP and FMS   I spent a total of 23   minutes on total care today- >50% coordination of care- due to discussion about fatigue options- will try Vit DTSH first and if need be, can think about Ritalin/Provigil if need be.

## 2021-09-17 NOTE — Patient Instructions (Signed)
Pt is a 66 yr old female with hx of RA- hands, elbows, shoulders and neck-  DJD of "entire back"; borderline diabetes; GERD; no HTN. Also has scoliosis 15-20 degrees thoracolumbar and lumbar radiculopathy based on xray results. Also has Fibromyalgia-    Here for f/u on chronic back pain and RA chronic pain.  Due to severe fatigue which is common with RA, however could also be due to thyroid or Vit D Deficiency issues, will check TSH and Vit D level- so know if have to replete levels. Hasn't been checke din years per computer.   Con't Tramadol- 50 mg q6 hours prn #120- 5 refills.   3. Con't Amantadine - helps specifically with fogginess/more focus.  4. Will get Vit D and TSH level and prescribe meds as required, based on results.   5. Start Flexeril/Cyclobenzaprine 10 mg 2x/day as needed for muscle spasms when pain is severe.    6. F/U in 3 months On RA and LBP and FMS

## 2021-09-18 LAB — VITAMIN D 25 HYDROXY (VIT D DEFICIENCY, FRACTURES): Vit D, 25-Hydroxy: 39.1 ng/mL (ref 30.0–100.0)

## 2021-09-18 LAB — TSH: TSH: 1.17 u[IU]/mL (ref 0.450–4.500)

## 2021-09-18 NOTE — Progress Notes (Signed)
Internal Medicine Clinic Attending  Case discussed with the resident at the time of the visit.  We reviewed the resident's history and exam and pertinent patient test results.  I agree with the assessment, diagnosis, and plan of care documented in the resident's note.  

## 2021-10-29 NOTE — Progress Notes (Signed)
Office Visit Note  Patient: Heidi Chapman             Date of Birth: February 08, 1956           MRN: [id number]             PCP: Farrel Gordon, DO Referring: Farrel Gordon, DO Visit Date: 11/06/2021   Subjective:  Follow-up (RA in hands. Needs Humira preapproved. )   History of Present Illness: Heidi Chapman is a 66 y.o. female here for follow up for seropositive RA. She was taking Humira 40 mg [place] q14days with pretty stable symptoms but has missed the last 2 doses due to change in insurance and needing medicine pre-approval again. She has increased joint pain and stiffness especially in hands and wrists.  Previous HPI 08/06/2021 Heidi Chapman is a 66 y.o. female here for follow up for seropositive RA on Humira 40 mg [place] q14days started in March. She has generalized pain and fatigue related to fibromyalgia syndrome and DJD of her back, followed by Dr. Dagoberto Ligas currently on duloxetine and PRN tramadol. Symptoms are doing somewhat better. She still feels daily stiffness but is not seeing much swelling and pain is decreased in elbow and hands and can tightly close fists. She has had several URI type symptoms intermittently feels like she gets sick when her granddaughter brings germs home from school, no antibiotics treatment or pneumonia.   Previous HPI 06/04/21 Heidi Chapman is a 66 y.o. female here for follow up for seropositive RA on Humira 40 mg [place] q14days started last month. Previous MTX treatment was not resumed due to abnormal baseline LFTs. She felt improvement after about 3 days on prednisone taper. Inflammation remains better but came back somewhat, but no large swollen nodules on the neck recently. Duloxetine is helping her chronic pain overall but still hurts with any activity more than an hour. She followed up with her neurology for the sleep apnea and fatigue still very fatigued limiting her activity.   Previous HPI 03/13/21 Heidi Chapman is a 66 y.o. female here for seropositive  rheumatoid arthritis which has been treated with methotrexate 17.5 mg [place] weekly and simponi aria infusion and taking folic acid 3 mg daily. In addition to RA she suffers pain with OA including lumbar spine DJD and fibromyalgia pain treated with naproxen 500 mg PRN and flexeril.  She reports joint pain started since many years ago particularly with progressive pain and stiffness and then lateral deviation worsening in MCP joints of both hands.  She also had chronic osteoarthritis particularly in her back with sciatica and back pain at multiple levels.  She finally went for evaluation of the hand pain and deformities since about 3 years ago was diagnosed with seropositive rheumatoid arthritis.  Initial treatments tried include leflunomide which was not tolerated and Enbrel which did not have clinical response.  Oral methotrexate was not tolerable she had some degree of nausea with the subcutaneous methotrexate as well but not as bad.  While on treatment she felt disease control was very good mostly just had the chronic OA type symptoms not much swelling and no progression of deformities. She has been a patient with Dr. Trudie Reed needing to transfer care due to change in insurance status.  She is now off all of her medications since about a month ago and is noticing worsening joint pains again.  Particularly since 2 weeks ago she has much worse neck pain is developed lymph node swelling on the  back of her skull and neck.  She went to the emergency department for this were no specific problems were found liver function test showed a mild elevation she was prescribed Keflex empirically for possible infection has not noticed any difference since taking this medicine. Besides joint pain and lymph node swelling she feels extremely fatigued overall.   DMARD Hx Enbrel primary nonresponder Simponi aria TOC Methotrexate [place] TOC Methotrexate PO GI intolerance Leflunomide not tolerated   Labs reviewed 02/2021 ESR 26 CBC  wnl CMP AST 43 ALT 56 Alk phos 149   03/2019 TB neg HBV neg HCV neg   01/2019 RA 109 CCP >250   10/2017 HIV neg   Review of Systems  Constitutional:  Positive for fatigue.  HENT:  Positive for mouth dryness. Negative for mouth sores.   Eyes:  Negative for dryness.  Respiratory:  Positive for shortness of breath.   Cardiovascular:  Positive for palpitations. Negative for chest pain.  Gastrointestinal:  Negative for blood in stool, constipation and diarrhea.  Endocrine: Negative for increased urination.  Genitourinary:  Negative for involuntary urination.  Musculoskeletal:  Positive for joint pain, gait problem, joint pain, joint swelling, myalgias, muscle weakness, muscle tenderness and myalgias.  Skin:  Positive for sensitivity to sunlight. Negative for color change, rash and hair loss.  Allergic/Immunologic: Positive for susceptible to infections.  Neurological:  Positive for dizziness. Negative for headaches.  Hematological:  Negative for swollen glands.  Psychiatric/Behavioral:  Positive for sleep disturbance. Negative for depressed mood. The patient is nervous/anxious.     PMFS History:  Patient Active Problem List   Diagnosis Date Noted   Normocytic anemia 08/26/2021   Fibromyalgia 06/04/2021   Depression with anxiety 05/20/2021   Chronic bilateral back pain 03/19/2021   Other idiopathic scoliosis, thoracolumbar region 03/19/2021   Lumbar radiculopathy 03/19/2021   High risk medication use 03/13/2021   Body mass index (BMI) 30.0-30.9, adult 03/13/2021   Osteoarthritis 11/18/2020   Obstructive sleep apnea 08/29/2020   Chronic fatigue 06/28/2020   Hyperlipidemia 06/28/2020   Prediabetes 06/28/2020   Rheumatoid arthritis involving multiple sites with positive rheumatoid factor ([place]) 03/13/2019    Past Medical History:  Diagnosis Date   Absence of both cervix and uterus, acquired 12/14/2011   Acalculous cholecystitis 10/23/2017   Acquired deformity of right hand  03/13/2021   Acute left-sided low back pain without sciatica 03/13/2019   Anxiety    Anxiety 01/26/2019   Arthritis    Benign neoplasm of colon 12/14/2011   Fatigue 06/28/2020   Fibromyalgia    GERD (gastroesophageal reflux disease)    Healthcare maintenance 12/06/2017   High risk medication use 03/13/2021   Lymphadenopathy 03/13/2021   Migraine    Neck pain 03/13/2021   Orthopnea 08/29/2020   Overweight 03/13/2021   Polyarthralgia 01/26/2019   Screening for colon cancer 06/28/2020   Screening mammogram for breast cancer 06/28/2020   Shortness of breath at rest 08/29/2020   Snoring 08/29/2020    Family History  Problem Relation Age of Onset   Cancer - Lung Mother    Brain cancer Mother    Colon polyps Father    Prostate cancer Father    Diverticulitis Father    Heart attack Father    Cancer Maternal Grandmother    Cancer Maternal Grandfather    Cancer Paternal Grandfather    Past Surgical History:  Procedure Laterality Date   CESAREAN SECTION     CHOLECYSTECTOMY N/A 10/24/2017   Procedure: LAPAROSCOPIC CHOLECYSTECTOMY WITH INTRAOPERATIVE CHOLANGIOGRAM;  Surgeon: Erroll Luna, MD;  Location: [facility];  Service: General;  Laterality: N/A;   GALLBLADDER SURGERY     TUBAL LIGATION     Social History   Social History Narrative   Lives with youngest daughter and granddaughter   Right handed   Caffeine: 1 cup of coffee and 1 soda daily   Immunization History  Administered Date(s) Administered   Influenza,inj,Quad PF,6+ Mos 01/26/2019, 11/18/2020   PFIZER(Purple Top)SARS-COV-2 Vaccination 05/13/2019, 06/07/2019, 10/30/2019     Objective: Vital Signs: BP 107/74 (BP Location: Left Arm, Patient Position: Sitting, Cuff Size: Normal)   Pulse 92   Resp 14   Ht 5' 5"  (1.651 m)   Wt 180 lb 6.4 oz (81.8 kg)   BMI 30.02 kg/m    Physical Exam Cardiovascular:     Rate and Rhythm: Normal rate and regular rhythm.  Pulmonary:     Effort: Pulmonary effort is normal.     Breath  sounds: Normal breath sounds.  Musculoskeletal:     Right lower leg: No edema.     Left lower leg: No edema.  Skin:    General: Skin is warm and dry.     Findings: No rash.  Neurological:     Mental Status: She is alert.      Musculoskeletal Exam:  Shoulders full ROM no tenderness or swelling Elbows full ROM no tenderness or swelling Wrists right wrist swollen and tender, left wrist tenderness and small ganglion cyst or nodule present Fingers right 2nd MCP tenderness 3rd MCP swelling and tenderness Knees full ROM no tenderness or swelling Ankles full ROM no tenderness or swelling   CDAI Exam: CDAI Score: 12  Patient Global: 40 mm; Provider Global: 20 mm Swollen: 2 ; Tender: 4  Joint Exam 11/06/2021      Right  Left  Wrist  Swollen Tender   Tender  MCP 2   Tender     MCP 3  Swollen Tender        Investigation: No additional findings.  Imaging: No results found.  Recent Labs: Lab Results  Component Value Date   WBC 7.9 08/26/2021   HGB 13.6 08/26/2021   PLT 354 08/26/2021   NA 138 08/18/2021   K 3.6 08/18/2021   CL 103 08/18/2021   CO2 24 08/18/2021   GLUCOSE 126 (H) 08/18/2021   BUN 9 08/18/2021   CREATININE 0.93 08/18/2021   BILITOT 2.7 (H) 08/17/2021   ALKPHOS 156 (H) 08/17/2021   AST 44 (H) 08/17/2021   ALT 41 08/17/2021   PROT 7.1 08/17/2021   ALBUMIN 3.1 (L) 08/17/2021   CALCIUM 8.5 (L) 08/18/2021   GFRAA 84 01/26/2019   QFTBGOLDPLUS NEGATIVE 03/13/2021    Speciality Comments: Leflunomide - intolerance; Enbrel - inadequate response; oral MTX - intolerable side effects; SImponi Aria infusions stopped in Jan 2023 and switched to Humira for convenience of self-injection; MTX stopped in Feb 2023 due to elevated LFTs Humira started 04/17/21  Procedures:  No procedures performed Allergies: Azithromycin, Leflunomide, Peanut butter flavor, and Latex   Assessment / Plan:     Visit Diagnoses: Rheumatoid arthritis involving multiple sites with positive  rheumatoid factor ([place]) - Plan: Sedimentation rate  Disease activity looks worse after missed medication doses. Checking sed rate for monitoring, plan to resume humira 40 mg [place] q14days ASAP. She is not interested in short term prednisone treatment for symptoms at this time.  High risk medication use - Plan: CBC with Differential/Platelet, COMPLETE METABOLIC PANEL WITH GFR  Checking  CBC and CMP for medication monitoring anticipate resuming Humira. She has tolerated well, no interval infection or antibiotics required.  Fibromyalgia  Not in exacerbation at this time, not much generalized sensitivity, does have some generalized joint pains and stiffness but seems appropriate given RA flare. On cymbalta and flexeril and PRN tramadol managing symptoms.  Orders: Orders Placed This Encounter  Procedures   Sedimentation rate   CBC with Differential/Platelet   COMPLETE METABOLIC PANEL WITH GFR   No orders of the defined types were placed in this encounter.    Follow-Up Instructions: Return in about 3 months (around 02/05/2022) for RA on ADA f/u 17mo.   CCollier Salina MD  Note - This record has been created using [employer]  Chart creation errors have been sought, but may not always  have been located. Such creation errors do not reflect on  the standard of medical care.

## 2021-11-06 ENCOUNTER — Telehealth: Payer: Self-pay | Admitting: Pharmacist

## 2021-11-06 ENCOUNTER — Ambulatory Visit: Payer: Commercial Managed Care - HMO | Attending: Internal Medicine | Admitting: Internal Medicine

## 2021-11-06 ENCOUNTER — Encounter: Payer: Self-pay | Admitting: Internal Medicine

## 2021-11-06 VITALS — BP 107/74 | HR 92 | Resp 14 | Ht 65.0 in | Wt 180.4 lb

## 2021-11-06 DIAGNOSIS — M797 Fibromyalgia: Secondary | ICD-10-CM

## 2021-11-06 DIAGNOSIS — G4733 Obstructive sleep apnea (adult) (pediatric): Secondary | ICD-10-CM

## 2021-11-06 DIAGNOSIS — Z79899 Other long term (current) drug therapy: Secondary | ICD-10-CM | POA: Diagnosis not present

## 2021-11-06 DIAGNOSIS — R5382 Chronic fatigue, unspecified: Secondary | ICD-10-CM | POA: Diagnosis not present

## 2021-11-06 DIAGNOSIS — M0579 Rheumatoid arthritis with rheumatoid factor of multiple sites without organ or systems involvement: Secondary | ICD-10-CM

## 2021-11-06 NOTE — Telephone Encounter (Signed)
Submitted an URGENT Prior Authorization request to [employer] for HUMIRA via CoverMyMeds. Will update once we receive a response.  Key: [id number]  Knox Saliva, PharmD, MPH, BCPS, CPP Clinical Pharmacist (Rheumatology and Pulmonology)

## 2021-11-06 NOTE — Telephone Encounter (Signed)
Patient had OV today and stated she changed insurance. Previously had [employer] which stopped servicing [place] on 10/16/21  Please submit URGENT prior authorization through new insurance. Can use chart note from June 2023 to help prevent delay as it notates clinical improvement with Humira   Patient assistance paperwork completed in clinic today (likely won't be needed)  Knox Saliva, PharmD, MPH, BCPS, CPP Clinical Pharmacist (Rheumatology and Pulmonology)

## 2021-11-07 ENCOUNTER — Other Ambulatory Visit (HOSPITAL_COMMUNITY): Payer: Self-pay

## 2021-11-07 LAB — CBC WITH DIFFERENTIAL/PLATELET
Absolute Monocytes: 554 cells/uL (ref 200–950)
Basophils Absolute: 69 cells/uL (ref 0–200)
Basophils Relative: 1.1 %
Eosinophils Absolute: 132 cells/uL (ref 15–500)
Eosinophils Relative: 2.1 %
HCT: 46.8 % — ABNORMAL HIGH (ref 35.0–45.0)
Hemoglobin: 15.8 g/dL — ABNORMAL HIGH (ref 11.7–15.5)
Lymphs Abs: 2237 cells/uL (ref 850–3900)
MCH: 30.6 pg (ref 27.0–33.0)
MCHC: 33.8 g/dL (ref 32.0–36.0)
MCV: 90.5 fL (ref 80.0–100.0)
MPV: 11.1 fL (ref 7.5–12.5)
Monocytes Relative: 8.8 %
Neutro Abs: 3308 cells/uL (ref 1500–7800)
Neutrophils Relative %: 52.5 %
Platelets: 242 10*3/uL (ref 140–400)
RBC: 5.17 10*6/uL — ABNORMAL HIGH (ref 3.80–5.10)
RDW: 12.7 % (ref 11.0–15.0)
Total Lymphocyte: 35.5 %
WBC: 6.3 10*3/uL (ref 3.8–10.8)

## 2021-11-07 LAB — COMPLETE METABOLIC PANEL WITH GFR
AG Ratio: 1.3 (calc) (ref 1.0–2.5)
ALT: 37 U/L — ABNORMAL HIGH (ref 6–29)
AST: 32 U/L (ref 10–35)
Albumin: 4.3 g/dL (ref 3.6–5.1)
Alkaline phosphatase (APISO): 216 U/L — ABNORMAL HIGH (ref 37–153)
BUN: 16 mg/dL (ref 7–25)
CO2: 27 mmol/L (ref 20–32)
Calcium: 9.9 mg/dL (ref 8.6–10.4)
Chloride: 104 mmol/L (ref 98–110)
Creat: 0.81 mg/dL (ref 0.50–1.05)
Globulin: 3.3 g/dL (calc) (ref 1.9–3.7)
Glucose, Bld: 91 mg/dL (ref 65–99)
Potassium: 4.3 mmol/L (ref 3.5–5.3)
Sodium: 140 mmol/L (ref 135–146)
Total Bilirubin: 1.1 mg/dL (ref 0.2–1.2)
Total Protein: 7.6 g/dL (ref 6.1–8.1)
eGFR: 81 mL/min/{1.73_m2} (ref >=60)

## 2021-11-07 LAB — SEDIMENTATION RATE: Sed Rate: 39 mm/h — ABNORMAL HIGH (ref 0–30)

## 2021-11-07 MED ORDER — HUMIRA (2 PEN) 40 MG/0.4ML ~~LOC~~ AJKT: 40.0000 mg | AUTO-INJECTOR | SUBCUTANEOUS | 2 refills | Status: DC

## 2021-11-07 NOTE — Progress Notes (Signed)
Sedimentation rate is increased to 39 this is pretty expected considering her the increased joint inflammation due to being off medication.  Lab results look okay for resuming the Humira she has mildly abnormal liver function tests but not worse than before.

## 2021-11-07 NOTE — Telephone Encounter (Signed)
Received notification from [place] regarding a prior authorization for Heidi Chapman. Authorization has been APPROVED from 11/06/21 to 11/06/22.   Per test claim, copay for 28 days supply is $1733.22  Patient can fill through [facility]: [phone]  Authorization # [id number]  Patient will need to provide copay card information to [facility]  ID: [id number] Rx GROUP: [id number] Rx BIN: [id number] Rx PCN: [id number]  Knox Saliva, PharmD, MPH, BCPS, CPP Clinical Pharmacist (Rheumatology and Pulmonology)

## 2021-11-10 NOTE — Telephone Encounter (Addendum)
ATC patient to discuss Humira rx needing to be filled at [facility]. Unable to reach. Left VM requesting return call. MyChart message sent to patient  Heidi Chapman, PharmD, MPH, BCPS, CPP Clinical Pharmacist (Rheumatology and Pulmonology)

## 2021-11-11 NOTE — Telephone Encounter (Signed)
Patient returned call regarding Humira. Provided her with phone number for [facility] and advised her to check MyChart for Humira copay card information which she will need to provide to pharmacy. She verbalized understanding  Knox Saliva, PharmD, MPH, BCPS, CPP Clinical Pharmacist (Rheumatology and Pulmonology)

## 2021-12-03 ENCOUNTER — Ambulatory Visit: Payer: Commercial Managed Care - HMO | Admitting: Adult Health

## 2021-12-16 ENCOUNTER — Ambulatory Visit: Payer: Commercial Managed Care - HMO | Admitting: Adult Health

## 2021-12-18 NOTE — Progress Notes (Deleted)
[name] [address] [place]. [place] [address] [phone]       OFFICE FOLLOW UP NOTE  [name] Date of Birth:  Jan 22, 1956 Medical Record Number:  [id number]   Reason for visit: Initial CPAP follow-up    SUBJECTIVE:   CHIEF COMPLAINT:  No chief complaint on file.   HPI:   Update 12/22/2021 JM: Patient returns for CPAP follow-up visit.         History provided for reference purposes only Update 06/03/2021 JM: Patient returns for initial CPAP follow-up visit.  Completed HST 10/07/2020 which showed moderate sleep apnea with calculated AHI 26.5/h, REM AHI 29.2/h, NREM AHI 26.3/h, supine AHI 19.3/h and non supine AHI 26.9/h.  No evidence of hypoxia.  Recommended auto CPAP with set up date 03/18/2021.   Reports occasional difficulty tolerating as she is chronically a restless sleeper but she has been noticing feeling more relaxed at times while using CPAP.  She believes her tolerance has been gradually improving.  Denies any improvement of energy levels or daytime fatigue but she believes this is due to to other health conditions including RA.  She has since completely stopped tobacco use.  Fatigue severity scale 61/63 (prior 57/63).  Epworth Sleepiness Scale 18/24 (prior 15/24).        Initial consult visit 08/29/2020 Dr. Brett Fairy (provided for reference purposes only) Heidi Chapman is a 66 y.o. year old White or Caucasian female patient . She is seen 08-29-2020 for a sleep apnea evaluation.    Chief concern according to patient :  sleepiness and tiredness , ended up with pneumonia several times  and was finally diagnosed with rheumatoid arthritis. 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    I have the pleasure of seeing [name] today, a right-handed White or Caucasian female with a possible sleep disorder. She  has a past medical history of Anxiety, Arthritis, RF positive, ANA negative ,  GERD (gastroesophageal reflux disease), and Migraine.   Sleep relevant medical history: sleepwalker in childhood age 66- ongoing to 38, concussion at age 34,TBI,  cervical spine DDD.     Family medical /sleep history: No other family member on CPAP with OSA, insomnia, sleep walkers.    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  Pets : 6 cats and 3 dogs are present. Tobacco use; 10 cig a day.   ETOH use ; seldomly ,  Caffeine intake in form of Coffee( 1-2 cups) Soda( 2) Tea ( /) or energy drinks. Regular exercise  limited.          Sleep habits are as follows: The patient's dinner time is between  PM. The patient goes to bed at 12 PM and continues to sleep for 6 hours, wakes for 2 bathroom breaks, the first time at 2-3 AM.  The preferred sleep position is elevated for GERD- , with the support of 0 pillows. Dreams are reportedly frequent/vivid.  6.30 AM is the usual rise time. The patient wakes up with her dogs. .  She reports not feeling refreshed or restored in AM, with symptoms such as dry mouth, rare morning headaches, and frequent residual fatigue. Naps are taken daily-frequently, lasting from 1-2 hours and are more refreshing than nocturnal sleep.       ROS:   14 system review of systems performed and negative with exception of those listed in  HPI  PMH:  Past Medical History:  Diagnosis Date   Absence of both cervix and uterus, acquired 12/14/2011   Acalculous cholecystitis 10/23/2017   Acquired deformity of right hand 03/13/2021   Acute left-sided low back pain without sciatica 03/13/2019   Anxiety    Anxiety 01/26/2019   Arthritis    Benign neoplasm of colon 12/14/2011   Fatigue 06/28/2020   Fibromyalgia    GERD (gastroesophageal reflux disease)    Healthcare maintenance 12/06/2017   High risk medication use 03/13/2021   Lymphadenopathy 03/13/2021   Migraine    Neck pain  03/13/2021   Orthopnea 08/29/2020   Overweight 03/13/2021   Polyarthralgia 01/26/2019   Screening for colon cancer 06/28/2020   Screening mammogram for breast cancer 06/28/2020   Shortness of breath at rest 08/29/2020   Snoring 08/29/2020    PSH:  Past Surgical History:  Procedure Laterality Date   CESAREAN SECTION     CHOLECYSTECTOMY N/A 10/24/2017   Procedure: LAPAROSCOPIC CHOLECYSTECTOMY WITH INTRAOPERATIVE CHOLANGIOGRAM;  Surgeon: Erroll Luna, MD;  Location: [facility];  Service: General;  Laterality: N/A;   GALLBLADDER SURGERY     TUBAL LIGATION      Social History:  Social History   Socioeconomic History   Marital status: Legally Separated    Spouse name: Not on file   Number of children: Not on file   Years of education: Not on file   Highest education level: Not on file  Occupational History   Not on file  Tobacco Use   Smoking status: Former    Packs/day: 0.50    Years: 40.00    Total pack years: 20.00    Types: Cigarettes    Quit date: 02/14/2021    Years since quitting: 0.8    Passive exposure: Past   Smokeless tobacco: Never  Vaping Use   Vaping Use: Never used  Substance and Sexual Activity   Alcohol use: Yes    Comment: 1-3 drinks annually   Drug use: Never   Sexual activity: Not on file  Other Topics Concern   Not on file  Social History Narrative   Lives with youngest daughter and granddaughter   Right handed   Caffeine: 1 cup of coffee and 1 soda daily   Social Determinants of Health   Financial Resource Strain: Not on file  Food Insecurity: Not on file  Transportation Needs: Not on file  Physical Activity: Not on file  Stress: Not on file  Social Connections: Not on file  Intimate Partner Violence: Not on file    Family History:  Family History  Problem Relation Age of Onset   Cancer - Lung Mother    Brain cancer Mother    Colon polyps Father    Prostate cancer Father    Diverticulitis Father    Heart attack Father    Cancer  Maternal Grandmother    Cancer Maternal Grandfather    Cancer Paternal Grandfather     Medications:   Current Outpatient Medications on File Prior to Visit  Medication Sig Dispense Refill   Adalimumab (HUMIRA PEN) 40 MG/0.4ML PNKT Inject 40 mg into the skin every 14 (fourteen) days. 2 each 2   amantadine (SYMMETREL) 100 MG capsule Take 1 capsule (100 mg total) by mouth daily. 90 capsule 1   atorvastatin (LIPITOR) 10 MG tablet Take 1 tablet (10 mg total) by mouth daily. 30 tablet 11   cefdinir (OMNICEF) 300 MG capsule Take 1 capsule (300 mg total) by mouth 2 (two)  times daily. (Patient not taking: Reported on 11/06/2021) 4 capsule 0   cimetidine (TAGAMET) 200 MG tablet Take 200 mg by mouth daily as needed (Acid reflux).     cyclobenzaprine (FLEXERIL) 10 MG tablet Take 1 tablet (10 mg total) by mouth 2 (two) times daily as needed for muscle spasms. 180 tablet 1   DULoxetine (CYMBALTA) 30 MG capsule Take 1 capsule (30 mg total) by mouth at bedtime. Take 30 mg nightly x 1 week, then 60 mg nightly- for back/nerve pain (Patient taking differently: Take 30 mg by mouth at bedtime.) 60 capsule 3   Multiple Vitamins-Minerals (MULTIVITAMIN WOMEN 50+) TABS Take 1 tablet by mouth daily.     promethazine (PHENERGAN) 12.5 MG tablet Take 1 tablet (12.5 mg total) by mouth every 6 (six) hours as needed for nausea or vomiting. 90 tablet 1   traMADol (ULTRAM) 50 MG tablet Take 1 tablet (50 mg total) by mouth every 6 (six) hours as needed. 120 tablet 5   No current facility-administered medications on file prior to visit.    Allergies:   Allergies  Allergen Reactions   Azithromycin Itching, Swelling and Rash   Leflunomide Other (See Comments)   Peanut Butter Flavor Other (See Comments)   Latex Rash      OBJECTIVE:  Physical Exam  There were no vitals filed for this visit.  There is no height or weight on file to calculate BMI. No results found.  General: well developed, well nourished, very  pleasant middle-age Caucasian female, seated, in no evident distress Head: head normocephalic and atraumatic.   Neck: supple with no carotid or supraclavicular bruits Cardiovascular: regular rate and rhythm, no murmurs Musculoskeletal: no deformity Skin:  no rash/petichiae Vascular:  Normal pulses all extremities   Neurologic Exam Mental Status: Awake and fully alert. Oriented to place and time. Recent and remote memory intact. Attention span, concentration and fund of knowledge appropriate. Mood and affect appropriate.  Cranial Nerves: Pupils equal, briskly reactive to light. Extraocular movements full without nystagmus. Visual fields full to confrontation. Hearing intact. Facial sensation intact. Face, tongue, palate moves normally and symmetrically.  Motor: Normal bulk and tone. Normal strength in all tested extremity muscles Sensory.: intact to touch , pinprick , position and vibratory sensation.  Coordination: Rapid alternating movements normal in all extremities. Finger-to-nose and heel-to-shin performed accurately bilaterally. Gait and Station: Arises from chair without difficulty. Stance is normal. Gait demonstrates normal stride length and balance without use of AD.  Reflexes: 1+ and symmetric. Toes downgoing.         ASSESSMENT: Heidi Chapman is a 66 y.o. year old female with new diagnosis of moderate sleep apnea per HST in 09/2020 and has since been started on AutoPap.      PLAN:  OSA on CPAP : Satisfactory compliance with optimal residual AHI.  Discussed ways to help improve tolerance.  Discussed importance of continue nightly usage and ensuring greater than 4 hours per night for optimal benefit and per insurance requirements.  She will continue to follow with her DME company for any needed supplies or CPAP related concerns.  She will continue to follow with PCP and rheumatology for RA likely contributing to continued daytime fatigue    Follow up in 6 months or call  earlier if needed   CC:  PCP: Farrel Gordon, DO    I spent 24 minutes of face-to-face and non-face-to-face time with patient.  This included previsit chart review, lab review, study review, order entry, electronic health  record documentation, patient education regarding diagnosis of sleep apnea with review and discussion of compliance report and answered all other questions to patient's satisfaction   Frann Rider, [facility]  [facility] [address] [place], [place] [address]  Phone [phone] Fax [phone] Note: This document was prepared with digital dictation and possible smart phrase technology. Any transcriptional errors that result from this process are unintentional.

## 2021-12-22 ENCOUNTER — Ambulatory Visit: Payer: Commercial Managed Care - HMO | Admitting: Adult Health

## 2022-01-01 NOTE — Progress Notes (Signed)
[name] [address] [place]. [place] [address] [phone]       OFFICE FOLLOW UP NOTE  [name] Date of Birth:  02-17-1955 Medical Record Number:  [id number]   Primary neurologist: Dr. Brett Fairy  Reason for visit: CPAP follow-up    SUBJECTIVE:   CHIEF COMPLAINT:  Chief Complaint  Patient presents with   Follow-up    Pt alone, rm 3. She is wanting to come off the machine. It is disturbing her sleep. She states that if she has a fibromyalgia flare up it hurts to be on her face. DME [facility]    HPI:   Update 01/05/2022 JM: Patient returns for CPAP follow-up visit. Review of DL report as below. She has been having issues tolerating CPAP mask, having difficulty laying on her side as it will "cut the flow of air off to the mask and make raising noises", has difficulty laying on her back, has been having fibromyalgia flareups and when this happens, has significant difficulty tolerating anything on her face. She plans on returning her CPAP machine back to DME as she continues to have difficulty tolerating and has not noticed any significant improvement of her sleep or her daytime energy levels, she is not currently interested in any other treatment options. Continued tobacco cessation. She plans on starting to work on weight loss soon.  Epworth Sleepiness Scale 15/24.           History provided for reference purposes only Update 06/03/2021 JM: Patient returns for initial CPAP follow-up visit.  Completed HST 10/07/2020 which showed moderate sleep apnea with calculated AHI 26.5/h, REM AHI 29.2/h, NREM AHI 26.3/h, supine AHI 19.3/h and non supine AHI 26.9/h.  No evidence of hypoxia.  Recommended auto CPAP with set up date 03/18/2021.   Reports occasional difficulty tolerating as she is chronically a restless sleeper but she has been noticing feeling more relaxed at times while using CPAP.  She believes her tolerance has been gradually improving.  Denies  any improvement of energy levels or daytime fatigue but she believes this is due to to other health conditions including RA.  She has since completely stopped tobacco use.  Fatigue severity scale 61/63 (prior 57/63).  Epworth Sleepiness Scale 18/24 (prior 15/24).        Initial consult visit 08/29/2020 Dr. Brett Fairy (provided for reference purposes only) Heidi Chapman is a 66 y.o. year old White or Caucasian female patient . She is seen 08-29-2020 for a sleep apnea evaluation.    Chief concern according to patient :  sleepiness and tiredness , ended up with pneumonia several times  and was finally diagnosed with rheumatoid arthritis. 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    I have the pleasure of seeing Heidi Chapman today, a right-handed White or Caucasian female with a possible sleep disorder. She  has a past medical history of Anxiety, Arthritis, RF positive, ANA negative , GERD (gastroesophageal reflux disease), and Migraine.   Sleep relevant medical history: sleepwalker in childhood age 86- ongoing to 76, concussion at age 78,TBI,  cervical spine DDD.     Family medical /sleep history: No other family member on CPAP with OSA, insomnia, sleep walkers.    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  Pets : 6 cats and 3 dogs are present. Tobacco use; 10 cig a day.   ETOH use ;  seldomly ,  Caffeine intake in form of Coffee( 1-2 cups) Soda( 2) Tea ( /) or energy drinks. Regular exercise  limited.          Sleep habits are as follows: The patient's dinner time is between  PM. The patient goes to bed at 12 PM and continues to sleep for 6 hours, wakes for 2 bathroom breaks, the first time at 2-3 AM.  The preferred sleep position is elevated for GERD- , with the support of 0 pillows. Dreams are reportedly frequent/vivid.  6.30 AM is the usual rise  time. The patient wakes up with her dogs. .  She reports not feeling refreshed or restored in AM, with symptoms such as dry mouth, rare morning headaches, and frequent residual fatigue. Naps are taken daily-frequently, lasting from 1-2 hours and are more refreshing than nocturnal sleep.       ROS:   14 system review of systems performed and negative with exception of those listed in HPI  PMH:  Past Medical History:  Diagnosis Date   Absence of both cervix and uterus, acquired 12/14/2011   Acalculous cholecystitis 10/23/2017   Acquired deformity of right hand 03/13/2021   Acute left-sided low back pain without sciatica 03/13/2019   Anxiety    Anxiety 01/26/2019   Arthritis    Benign neoplasm of colon 12/14/2011   Fatigue 06/28/2020   Fibromyalgia    GERD (gastroesophageal reflux disease)    Healthcare maintenance 12/06/2017   High risk medication use 03/13/2021   Lymphadenopathy 03/13/2021   Migraine    Neck pain 03/13/2021   Orthopnea 08/29/2020   Overweight 03/13/2021   Polyarthralgia 01/26/2019   Screening for colon cancer 06/28/2020   Screening mammogram for breast cancer 06/28/2020   Shortness of breath at rest 08/29/2020   Snoring 08/29/2020    PSH:  Past Surgical History:  Procedure Laterality Date   CESAREAN SECTION     CHOLECYSTECTOMY N/A 10/24/2017   Procedure: LAPAROSCOPIC CHOLECYSTECTOMY WITH INTRAOPERATIVE CHOLANGIOGRAM;  Surgeon: Erroll Luna, MD;  Location: [facility];  Service: General;  Laterality: N/A;   GALLBLADDER SURGERY     TUBAL LIGATION      Social History:  Social History   Socioeconomic History   Marital status: Legally Separated    Spouse name: Not on file   Number of children: Not on file   Years of education: Not on file   Highest education level: Not on file  Occupational History   Not on file  Tobacco Use   Smoking status: Former    Packs/day: 0.50    Years: 40.00    Total pack years: 20.00    Types: Cigarettes    Quit date: 02/14/2021     Years since quitting: 0.8    Passive exposure: Past   Smokeless tobacco: Never  Vaping Use   Vaping Use: Never used  Substance and Sexual Activity   Alcohol use: Yes    Comment: 1-3 drinks annually   Drug use: Never   Sexual activity: Not on file  Other Topics Concern   Not on file  Social History Narrative   Lives with youngest daughter and granddaughter   Right handed   Caffeine: 1 cup of coffee and 1 soda daily   Social Determinants of Health   Financial Resource Strain: Not on file  Food Insecurity: Not on file  Transportation Needs: Not on file  Physical Activity: Not on file  Stress: Not on file  Social Connections: Not on file  Intimate  Partner Violence: Not on file    Family History:  Family History  Problem Relation Age of Onset   Cancer - Lung Mother    Brain cancer Mother    Colon polyps Father    Prostate cancer Father    Diverticulitis Father    Heart attack Father    Cancer Maternal Grandmother    Cancer Maternal Grandfather    Cancer Paternal Grandfather     Medications:   Current Outpatient Medications on File Prior to Visit  Medication Sig Dispense Refill   Adalimumab (HUMIRA PEN) 40 MG/0.4ML PNKT Inject 40 mg into the skin every 14 (fourteen) days. 2 each 2   amantadine (SYMMETREL) 100 MG capsule Take 1 capsule (100 mg total) by mouth daily. 90 capsule 1   atorvastatin (LIPITOR) 10 MG tablet Take 1 tablet (10 mg total) by mouth daily. 30 tablet 11   cimetidine (TAGAMET) 200 MG tablet Take 200 mg by mouth daily as needed (Acid reflux).     cyclobenzaprine (FLEXERIL) 10 MG tablet Take 1 tablet (10 mg total) by mouth 2 (two) times daily as needed for muscle spasms. 180 tablet 1   Multiple Vitamins-Minerals (MULTIVITAMIN WOMEN 50+) TABS Take 1 tablet by mouth daily.     promethazine (PHENERGAN) 12.5 MG tablet Take 1 tablet (12.5 mg total) by mouth every 6 (six) hours as needed for nausea or vomiting. 90 tablet 1   traMADol (ULTRAM) 50 MG tablet Take  1 tablet (50 mg total) by mouth every 6 (six) hours as needed. 120 tablet 5   No current facility-administered medications on file prior to visit.    Allergies:   Allergies  Allergen Reactions   Azithromycin Itching, Swelling and Rash   Leflunomide Other (See Comments)   Peanut Butter Flavor Other (See Comments)   Latex Rash      OBJECTIVE:  Physical Exam  Vitals:   01/05/22 0803  BP: (!) 141/80  Pulse: 80  Weight: 188 lb (85.3 kg)  Height: '5\' 5"'$  (1.651 m)   Body mass index is 31.28 kg/m. No results found.   General: well developed, well nourished, very pleasant middle-age Caucasian female, seated, in no evident distress Head: head normocephalic and atraumatic.   Neck: supple with no carotid or supraclavicular bruits Cardiovascular: regular rate and rhythm, no murmurs Musculoskeletal: no deformity Skin:  no rash/petichiae Vascular:  Normal pulses all extremities   Neurologic Exam Mental Status: Awake and fully alert. Oriented to place and time. Recent and remote memory intact. Attention span, concentration and fund of knowledge appropriate. Mood and affect appropriate.  Cranial Nerves: Pupils equal, briskly reactive to light. Extraocular movements full without nystagmus. Visual fields full to confrontation. Hearing intact. Facial sensation intact. Face, tongue, palate moves normally and symmetrically.  Motor: Normal bulk and tone. Normal strength in all tested extremity muscles Sensory.: intact to touch , pinprick , position and vibratory sensation.  Coordination: Rapid alternating movements normal in all extremities. Finger-to-nose and heel-to-shin performed accurately bilaterally. Gait and Station: Arises from chair without difficulty. Stance is normal. Gait demonstrates normal stride length and balance without use of AD.  Reflexes: 1+ and symmetric. Toes downgoing.         ASSESSMENT: Heidi Chapman is a 66 y.o. year old female with new diagnosis of  moderate sleep apnea per HST in 09/2020 and has since been started on AutoPap.      PLAN:  OSA on CPAP : Poor compliance due to difficulty tolerating.  Discussed ways to help  tolerance and possibility trial of different mask but is not interested at this time.  She plans on returning CPAP back to DME.  She plans on working on weight loss.  Discussed importance of adequate sleep apnea management and increased risk with untreated sleep apnea, she verbalized understanding. She is not interested in any other type of treatment options at this time but will call office to schedule f/u with Dr. Brett Fairy if interested in pursuing in the future.      CC:  PCP: Farrel Gordon, DO    I spent 21 minutes of face-to-face and non-face-to-face time with patient.  This included previsit chart review, lab review, study review, order entry, electronic health record documentation, patient education regarding sleep apnea with review and discussion of compliance report and answered all other questions to patient's satisfaction   Frann Rider, [facility]  [facility] [address] [place], [place] [address]  Phone [phone] Fax [phone] Note: This document was prepared with digital dictation and possible smart phrase technology. Any transcriptional errors that result from this process are unintentional.

## 2022-01-02 ENCOUNTER — Encounter: Payer: Commercial Managed Care - HMO | Admitting: Physical Medicine and Rehabilitation

## 2022-01-05 ENCOUNTER — Encounter: Payer: Self-pay | Admitting: Adult Health

## 2022-01-05 ENCOUNTER — Ambulatory Visit: Payer: Commercial Managed Care - HMO | Admitting: Adult Health

## 2022-01-05 VITALS — BP 141/80 | HR 80 | Ht 65.0 in | Wt 188.0 lb

## 2022-01-05 DIAGNOSIS — G4733 Obstructive sleep apnea (adult) (pediatric): Secondary | ICD-10-CM | POA: Diagnosis not present

## 2022-01-05 DIAGNOSIS — Z789 Other specified health status: Secondary | ICD-10-CM

## 2022-01-05 NOTE — Patient Instructions (Signed)
Please call office if you would like to schedule a visit with Dr. Brett Fairy to discuss other treatment options

## 2022-02-03 ENCOUNTER — Encounter: Payer: Self-pay | Admitting: Internal Medicine

## 2022-02-03 ENCOUNTER — Ambulatory Visit: Payer: Commercial Managed Care - HMO | Attending: Internal Medicine | Admitting: Internal Medicine

## 2022-02-03 VITALS — BP 125/86 | HR 108 | Resp 16 | Ht 65.0 in | Wt 188.0 lb

## 2022-02-03 DIAGNOSIS — M4725 Other spondylosis with radiculopathy, thoracolumbar region: Secondary | ICD-10-CM

## 2022-02-03 DIAGNOSIS — Z79899 Other long term (current) drug therapy: Secondary | ICD-10-CM | POA: Diagnosis not present

## 2022-02-03 DIAGNOSIS — Z0271 Encounter for disability determination: Secondary | ICD-10-CM

## 2022-02-03 DIAGNOSIS — M0579 Rheumatoid arthritis with rheumatoid factor of multiple sites without organ or systems involvement: Secondary | ICD-10-CM | POA: Diagnosis not present

## 2022-02-03 NOTE — Progress Notes (Signed)
Office Visit Note  Patient: Heidi Chapman             Date of Birth: 1955-03-20           MRN: [id number]             PCP: Farrel Gordon, DO Referring: Farrel Gordon, DO Visit Date: 02/03/2022   Subjective:  Follow-up (Doing good)   History of Present Illness: Heidi Chapman is a 66 y.o. female here for follow up for seropositive RA on Humira 40 mg [place] q14 days. Sinc resuming the Humira she notices improvement in her hand stiffness and swelling although still has some pain. Particularly nodules on the back of her joints hurt a lot with any minor bump or pressure.   Previous HPI 11/06/21 Heidi Chapman is a 66 y.o. female here for follow up for seropositive RA. She was taking Humira 40 mg [place] q14days with pretty stable symptoms but has missed the last 2 doses due to change in insurance and needing medicine pre-approval again. She has increased joint pain and stiffness especially in hands and wrists.   Previous HPI 08/06/2021 Heidi Chapman is a 66 y.o. female here for follow up for seropositive RA on Humira 40 mg [place] q14days started in March. She has generalized pain and fatigue related to fibromyalgia syndrome and DJD of her back, followed by Dr. Dagoberto Ligas currently on duloxetine and PRN tramadol. Symptoms are doing somewhat better. She still feels daily stiffness but is not seeing much swelling and pain is decreased in elbow and hands and can tightly close fists. She has had several URI type symptoms intermittently feels like she gets sick when her granddaughter brings germs home from school, no antibiotics treatment or pneumonia.   Previous HPI 06/04/21 Heidi Graley Chapman is a 66 y.o. female here for follow up for seropositive RA on Humira 40 mg [place] q14days started last month. Previous MTX treatment was not resumed due to abnormal baseline LFTs. She felt improvement after about 3 days on prednisone taper. Inflammation remains better but came back somewhat, but no large swollen nodules on the  neck recently. Duloxetine is helping her chronic pain overall but still hurts with any activity more than an hour. She followed up with her neurology for the sleep apnea and fatigue still very fatigued limiting her activity.   Previous HPI 03/13/21 Unknown Heidi Chapman is a 66 y.o. female here for seropositive rheumatoid arthritis which has been treated with methotrexate 17.5 mg [place] weekly and simponi aria infusion and taking folic acid 3 mg daily. In addition to RA she suffers pain with OA including lumbar spine DJD and fibromyalgia pain treated with naproxen 500 mg PRN and flexeril.  She reports joint pain started since many years ago particularly with progressive pain and stiffness and then lateral deviation worsening in MCP joints of both hands.  She also had chronic osteoarthritis particularly in her back with sciatica and back pain at multiple levels.  She finally went for evaluation of the hand pain and deformities since about 3 years ago was diagnosed with seropositive rheumatoid arthritis.  Initial treatments tried include leflunomide which was not tolerated and Enbrel which did not have clinical response.  Oral methotrexate was not tolerable she had some degree of nausea with the subcutaneous methotrexate as well but not as bad.  While on treatment she felt disease control was very good mostly just had the chronic OA type symptoms not much swelling and no progression of deformities.  She has been a patient with Dr. Trudie Reed needing to transfer care due to change in insurance status.  She is now off all of her medications since about a month ago and is noticing worsening joint pains again.  Particularly since 2 weeks ago she has much worse neck pain is developed lymph node swelling on the back of her skull and neck.  She went to the emergency department for this were no specific problems were found liver function test showed a mild elevation she was prescribed Keflex empirically for possible infection has not  noticed any difference since taking this medicine. Besides joint pain and lymph node swelling she feels extremely fatigued overall.   DMARD Hx Enbrel primary nonresponder Simponi aria TOC Methotrexate [place] TOC Methotrexate PO GI intolerance Leflunomide not tolerated   Labs reviewed 02/2021 ESR 26 CBC wnl CMP AST 43 ALT 56 Alk phos 149   03/2019 TB neg HBV neg HCV neg   01/2019 RA 109 CCP >250   10/2017 HIV neg   Review of Systems  Constitutional:  Positive for fatigue.  HENT:  Positive for mouth dryness. Negative for mouth sores.   Eyes:  Negative for dryness.  Respiratory:  Negative for shortness of breath.   Cardiovascular:  Negative for chest pain and palpitations.  Gastrointestinal:  Positive for diarrhea. Negative for blood in stool and constipation.  Endocrine: Positive for increased urination.  Genitourinary:  Negative for involuntary urination.  Musculoskeletal:  Positive for joint pain, gait problem, joint pain, joint swelling, myalgias, muscle weakness, morning stiffness, muscle tenderness and myalgias.  Skin:  Negative for color change, rash, hair loss and sensitivity to sunlight.  Allergic/Immunologic: Positive for susceptible to infections.  Neurological:  Positive for dizziness. Negative for headaches.  Hematological:  Negative for swollen glands.  Psychiatric/Behavioral:  Positive for sleep disturbance. Negative for depressed mood. The patient is nervous/anxious.     PMFS History:  Patient Active Problem List   Diagnosis Date Noted   Normocytic anemia 08/26/2021   Fibromyalgia 06/04/2021   Depression with anxiety 05/20/2021   Chronic bilateral back pain 03/19/2021   Other idiopathic scoliosis, thoracolumbar region 03/19/2021   Lumbar radiculopathy 03/19/2021   High risk medication use 03/13/2021   Body mass index (BMI) 30.0-30.9, adult 03/13/2021   Osteoarthritis 11/18/2020   Obstructive sleep apnea 08/29/2020   Chronic fatigue 06/28/2020    Hyperlipidemia 06/28/2020   Prediabetes 06/28/2020   Rheumatoid arthritis involving multiple sites with positive rheumatoid factor ([place]) 03/13/2019    Past Medical History:  Diagnosis Date   Absence of both cervix and uterus, acquired 12/14/2011   Acalculous cholecystitis 10/23/2017   Acquired deformity of right hand 03/13/2021   Acute left-sided low back pain without sciatica 03/13/2019   Anxiety    Anxiety 01/26/2019   Arthritis    Benign neoplasm of colon 12/14/2011   Fatigue 06/28/2020   Fibromyalgia    GERD (gastroesophageal reflux disease)    Healthcare maintenance 12/06/2017   High risk medication use 03/13/2021   Lymphadenopathy 03/13/2021   Migraine    Neck pain 03/13/2021   Orthopnea 08/29/2020   Overweight 03/13/2021   Polyarthralgia 01/26/2019   Screening for colon cancer 06/28/2020   Screening mammogram for breast cancer 06/28/2020   Shortness of breath at rest 08/29/2020   Snoring 08/29/2020    Family History  Problem Relation Age of Onset   Cancer - Lung Mother    Brain cancer Mother    Colon polyps Father    Prostate cancer Father  Diverticulitis Father    Heart attack Father    Cancer Maternal Grandmother    Cancer Maternal Grandfather    Cancer Paternal Grandfather    Past Surgical History:  Procedure Laterality Date   CESAREAN SECTION     CHOLECYSTECTOMY N/A 10/24/2017   Procedure: LAPAROSCOPIC CHOLECYSTECTOMY WITH INTRAOPERATIVE CHOLANGIOGRAM;  Surgeon: Erroll Luna, MD;  Location: [place];  Service: General;  Laterality: N/A;   GALLBLADDER SURGERY     TUBAL LIGATION     Social History   Social History Narrative   Lives with youngest daughter and granddaughter   Right handed   Caffeine: 1 cup of coffee and 1 soda daily   Immunization History  Administered Date(s) Administered   Influenza,inj,Quad PF,6+ Mos 01/26/2019, 11/18/2020   PFIZER(Purple Top)SARS-COV-2 Vaccination 05/13/2019, 06/07/2019, 10/30/2019     Objective: Vital Signs: BP 125/86  (BP Location: Right Arm, Patient Position: Sitting, Cuff Size: Normal)   Pulse (!) 108   Resp 16   Ht _0  (1.651 m)   Wt 188 lb (85.3 kg)   BMI 31.28 kg/m    Physical Exam Constitutional:      Appearance: She is obese.  Eyes:     Conjunctiva/sclera: Conjunctivae normal.  Cardiovascular:     Rate and Rhythm: Normal rate and regular rhythm.  Pulmonary:     Effort: Pulmonary effort is normal.     Breath sounds: Normal breath sounds.  Lymphadenopathy:     Cervical: No cervical adenopathy.  Skin:    General: Skin is warm and dry.  Neurological:     Mental Status: She is alert.  Psychiatric:        Mood and Affect: Mood normal.      Musculoskeletal Exam:  Shoulders full ROM no tenderness or swelling Elbows full ROM no tenderness or swelling Wrists full ROM no tenderness or swelling Fingers full ROM, mobile nodule present on dorsal side of left 2nd MCP, heberdon's nodes in DIP joints on right hand Knees full ROM no tenderness or swelling No MTP squeeze tenderness   CDAI Exam: CDAI Score: 5  Patient Global: 30 mm; Provider Global: 10 mm Swollen: 0 ; Tender: 2  Joint Exam 02/03/2022      Right  Left  MCP 2      Tender  DIP 4   Tender        Investigation: No additional findings.  Imaging: No results found.  Recent Labs: Lab Results  Component Value Date   WBC 6.3 11/06/2021   HGB 15.8 (H) 11/06/2021   PLT 242 11/06/2021   NA 140 11/06/2021   K 4.3 11/06/2021   CL 104 11/06/2021   CO2 27 11/06/2021   GLUCOSE 91 11/06/2021   BUN 16 11/06/2021   CREATININE 0.81 11/06/2021   BILITOT 1.1 11/06/2021   ALKPHOS 156 (H) 08/17/2021   AST 32 11/06/2021   ALT 37 (H) 11/06/2021   PROT 7.6 11/06/2021   ALBUMIN 3.1 (L) 08/17/2021   CALCIUM 9.9 11/06/2021   GFRAA 84 01/26/2019   QFTBGOLDPLUS NEGATIVE 03/13/2021    Speciality Comments: Leflunomide - intolerance; Enbrel - inadequate response; oral MTX - intolerable side effects; SImponi Aria infusions stopped in  Jan 2023 and switched to Humira for convenience of self-injection; MTX stopped in Feb 2023 due to elevated LFTs Humira started 04/17/21  Procedures:  No procedures performed Allergies: Azithromycin, Leflunomide, Peanut butter flavor, and Latex   Assessment / Plan:     Visit Diagnoses: Rheumatoid arthritis involving multiple sites with positive  rheumatoid factor (HCC) - Plan: Sedimentation rate  Symptoms appear considerably improved again since resuming the Humira.  Although did not have much objective peripheral joint synovitis on exam even at that time.  Will recheck sedimentation rate which had change corresponding with symptom severity reported.  The tender nodules appear to be Heberden's nodes of the distal finger joints probably more related to underlying osteoarthritis.  Plan to continue the Humira 40 mg subcu q. 14 days.  High risk medication use - Plan: CBC with Differential/Platelet, COMPLETE METABOLIC PANEL WITH GFR, QuantiFERON-TB Gold Plus  Checking CBC and CMP for long-term use of Humira medication monitoring.  Rechecking QuantiFERON for tuberculosis screening.   Orders: Orders Placed This Encounter  Procedures   Sedimentation rate   CBC with Differential/Platelet   COMPLETE METABOLIC PANEL WITH GFR   QuantiFERON-TB Gold Plus   No orders of the defined types were placed in this encounter.    Follow-Up Instructions: Return in about 3 months (around 05/05/2022) for RA/OA on ADA f/u 7mo.   CCollier Salina MD  Note - This record has been created using [employer]  Chart creation errors have been sought, but may not always  have been located. Such creation errors do not reflect on  the standard of medical care.

## 2022-02-05 LAB — COMPLETE METABOLIC PANEL WITH GFR
AG Ratio: 1.3 (calc) (ref 1.0–2.5)
ALT: 38 U/L — ABNORMAL HIGH (ref 6–29)
AST: 26 U/L (ref 10–35)
Albumin: 4.4 g/dL (ref 3.6–5.1)
Alkaline phosphatase (APISO): 218 U/L — ABNORMAL HIGH (ref 37–153)
BUN: 14 mg/dL (ref 7–25)
CO2: 26 mmol/L (ref 20–32)
Calcium: 10.5 mg/dL — ABNORMAL HIGH (ref 8.6–10.4)
Chloride: 105 mmol/L (ref 98–110)
Creat: 0.9 mg/dL (ref 0.50–1.05)
Globulin: 3.5 g/dL (calc) (ref 1.9–3.7)
Glucose, Bld: 116 mg/dL — ABNORMAL HIGH (ref 65–99)
Potassium: 4.7 mmol/L (ref 3.5–5.3)
Sodium: 141 mmol/L (ref 135–146)
Total Bilirubin: 1.3 mg/dL — ABNORMAL HIGH (ref 0.2–1.2)
Total Protein: 7.9 g/dL (ref 6.1–8.1)
eGFR: 71 mL/min/{1.73_m2} (ref >=60)

## 2022-02-05 LAB — QUANTIFERON-TB GOLD PLUS
Mitogen-NIL: >10 IU/mL
NIL: 0.04 IU/mL
QuantiFERON-TB Gold Plus: NEGATIVE
TB1-NIL: 0 IU/mL
TB2-NIL: 0.01 IU/mL

## 2022-02-05 LAB — CBC WITH DIFFERENTIAL/PLATELET
Absolute Monocytes: 534 cells/uL (ref 200–950)
Basophils Absolute: 90 cells/uL (ref 0–200)
Basophils Relative: 1.5 %
Eosinophils Absolute: 168 cells/uL (ref 15–500)
Eosinophils Relative: 2.8 %
HCT: 48 % — ABNORMAL HIGH (ref 35.0–45.0)
Hemoglobin: 16.4 g/dL — ABNORMAL HIGH (ref 11.7–15.5)
Lymphs Abs: 2238 cells/uL (ref 850–3900)
MCH: 30.5 pg (ref 27.0–33.0)
MCHC: 34.2 g/dL (ref 32.0–36.0)
MCV: 89.2 fL (ref 80.0–100.0)
MPV: 10.6 fL (ref 7.5–12.5)
Monocytes Relative: 8.9 %
Neutro Abs: 2970 cells/uL (ref 1500–7800)
Neutrophils Relative %: 49.5 %
Platelets: 270 10*3/uL (ref 140–400)
RBC: 5.38 10*6/uL — ABNORMAL HIGH (ref 3.80–5.10)
RDW: 12.8 % (ref 11.0–15.0)
Total Lymphocyte: 37.3 %
WBC: 6 10*3/uL (ref 3.8–10.8)

## 2022-02-05 LAB — SEDIMENTATION RATE: Sed Rate: 25 mm/h (ref 0–30)

## 2022-02-05 NOTE — Progress Notes (Signed)
Lab results look okay for continuing the Humira treatment.  Her liver enzyme tests show mild elevation in alkaline phosphatase and ALT but these are the same compared to 3 months ago when she was off the medication so I do not believe it is related.  Her sedimentation rate improved down to 25 which is normal.  Hemoglobin is mildly elevated at 16.4.  This could also be related with her sleep apnea.

## 2022-02-23 ENCOUNTER — Encounter: Payer: Self-pay | Admitting: Internal Medicine

## 2022-02-23 ENCOUNTER — Ambulatory Visit: Payer: 59 | Admitting: Internal Medicine

## 2022-02-23 ENCOUNTER — Other Ambulatory Visit: Payer: Self-pay

## 2022-02-23 VITALS — BP 130/91 | HR 89 | Temp 98.0°F | Ht 65.0 in | Wt 188.7 lb

## 2022-02-23 DIAGNOSIS — M0579 Rheumatoid arthritis with rheumatoid factor of multiple sites without organ or systems involvement: Secondary | ICD-10-CM

## 2022-02-23 DIAGNOSIS — Z23 Encounter for immunization: Secondary | ICD-10-CM

## 2022-02-23 DIAGNOSIS — R7303 Prediabetes: Secondary | ICD-10-CM

## 2022-02-23 DIAGNOSIS — Z87891 Personal history of nicotine dependence: Secondary | ICD-10-CM | POA: Diagnosis not present

## 2022-02-23 DIAGNOSIS — Z0000 Encounter for general adult medical examination without abnormal findings: Secondary | ICD-10-CM

## 2022-02-23 DIAGNOSIS — E785 Hyperlipidemia, unspecified: Secondary | ICD-10-CM

## 2022-02-23 MED ORDER — ATORVASTATIN CALCIUM 10 MG PO TABS: 10.0000 mg | ORAL_TABLET | Freq: Every day | ORAL | 11 refills | Status: DC

## 2022-02-23 NOTE — Assessment & Plan Note (Signed)
Last HbA1c 5.8% in 05/2021. She is not currently on therapy for glycemic control. No symptoms of hyperglycemia. Recent BMP with glucose of 116. Plan:Check HbA1c at next OV.

## 2022-02-23 NOTE — Progress Notes (Deleted)
1/2 PPD for 40 years, quit 1 year ago Flu shot today Shingles--will get at your pharmacy Pneumonia--not on care gap--got last year Got tetanus booster with gallbladder surgery

## 2022-02-23 NOTE — Patient Instructions (Signed)
Ms. Rahe,  It was great seeing you today; I'm glad that you are doing well!  I have sent a refill for your atorvastatin to the pharmacy. Please plan to follow up for regular check up in about 6 months, or sooner if needed!  My best, Dr. Marlou Sa

## 2022-02-23 NOTE — Assessment & Plan Note (Signed)
Flu vaccine given today. 

## 2022-02-23 NOTE — Progress Notes (Signed)
   CC: routine check up  HPI:  Heidi Chapman is a 67 y.o. person with past medical history as detailed below who presents today for routine check-up. Please see problem based charting for detailed assessment and plan.  Past Medical History:  Diagnosis Date   Absence of both cervix and uterus, acquired 12/14/2011   Acalculous cholecystitis 10/23/2017   Acquired deformity of right hand 03/13/2021   Acute left-sided low back pain without sciatica 03/13/2019   Anxiety    Anxiety 01/26/2019   Arthritis    Benign neoplasm of colon 12/14/2011   Fatigue 06/28/2020   Fibromyalgia    GERD (gastroesophageal reflux disease)    Healthcare maintenance 12/06/2017   High risk medication use 03/13/2021   Lymphadenopathy 03/13/2021   Migraine    Neck pain 03/13/2021   Orthopnea 08/29/2020   Overweight 03/13/2021   Polyarthralgia 01/26/2019   Screening for colon cancer 06/28/2020   Screening mammogram for breast cancer 06/28/2020   Shortness of breath at rest 08/29/2020   Snoring 08/29/2020   Review of Systems:  Negative unless otherwise stated.  Physical Exam:  Vitals:   02/23/22 1042  BP: (!) 130/91  Pulse: 89  Temp: 98 F (36.7 C)  TempSrc: Oral  SpO2: 94%  Weight: 188 lb 11.2 oz (85.6 kg)  Height: '5\' 5"'$  (1.651 m)   Constitutional:Appears well, stated age. In no acute distress. Cardio:Regular rate and rhythm. No murmurs, rubs, or gallops. Pulm:Clear to auscultation bilaterally. Normal work of breathing on room air. CXK:GYJEHUDJ for extremity edema. No inflammation appreciated over finger joints bilaterally. Skin:Warm and dry. Neuro:Alert and oriented x3. No focal deficit noted. Psych:Pleasant mood and affect.  Assessment & Plan:   See Encounters Tab for problem based charting.  Rheumatoid arthritis involving multiple sites with positive rheumatoid factor [facility]) Patient follows with rheumatology and PM&R for pain management. Symptom management includes humira, tramadol, amantadine,  and cyclobenzaprine. Several lab studies are followed by these clinics with recent studies showing elevated liver enzymes, hypercalcemia, and erythrocytosis which they are aware of. Assessment/Plan:Several of the previously noted lab abnormalities are likely related to RA treatment with Humira. As she is being followed by these clinics and that the abnormalities are mild and not progressing, will elect to not obtain repeat labs today. Consider further evaluation of abnormalities in the future if these values progress further.  Prediabetes Last HbA1c 5.8% in 05/2021. She is not currently on therapy for glycemic control. No symptoms of hyperglycemia. Recent BMP with glucose of 116. Plan:Check HbA1c at next OV.  Hyperlipidemia Last lipid profile 08/2021 with LDL at goal, 62. Current regimen is atorvastatin 10 mg daily.  Plan:Continue current regimen. Check lipid profile at next OV.  Need for immunization against influenza Flu vaccine given today.  History of tobacco abuse Patient meets requirements for annual low-dose CT scan for lung cancer screening. She smoked roughly 1/2 PPD for at least 40 years and quit around 1 year ago. Plan:Order placed for low-dose CT.  Healthcare maintenance On review of care gaps, -Shingles vaccines: Will go to her pharmacy to get these. -Pneumonia vaccine: States that she received this last year (I am unable to find a documented administration, just a deferred administration in 08/2021). -Tetanus booster: States that she received this when she had gallbladder surgery in 2019 (I am unable to find a documented administration).  Patient discussed with Dr. Heber Dunlap

## 2022-02-23 NOTE — Assessment & Plan Note (Addendum)
On review of care gaps, -Shingles vaccines: Will go to her pharmacy to get these. -Pneumonia vaccine: States that she received this last year (I am unable to find a documented administration, just a deferred administration in 08/2021). -Tetanus booster: States that she received this when she had gallbladder surgery in 2019 (I am unable to find a documented administration).

## 2022-02-23 NOTE — Assessment & Plan Note (Signed)
Patient meets requirements for annual low-dose CT scan for lung cancer screening. She smoked roughly 1/2 PPD for at least 40 years and quit around 1 year ago. Plan:Order placed for low-dose CT.

## 2022-02-23 NOTE — Assessment & Plan Note (Signed)
Patient follows with rheumatology and PM&R for pain management. Symptom management includes humira, tramadol, amantadine, and cyclobenzaprine. Several lab studies are followed by these clinics with recent studies showing elevated liver enzymes, hypercalcemia, and erythrocytosis which they are aware of. Assessment/Plan:Several of the previously noted lab abnormalities are likely related to RA treatment with Humira. As she is being followed by these clinics and that the abnormalities are mild and not progressing, will elect to not obtain repeat labs today. Consider further evaluation of abnormalities in the future if these values progress further.

## 2022-02-23 NOTE — Assessment & Plan Note (Signed)
Last lipid profile 08/2021 with LDL at goal, 62. Current regimen is atorvastatin 10 mg daily.  Plan:Continue current regimen. Check lipid profile at next OV.

## 2022-02-26 ENCOUNTER — Telehealth: Payer: Self-pay | Admitting: *Deleted

## 2022-02-26 NOTE — Progress Notes (Signed)
Internal Medicine Clinic Attending  Case discussed with the resident at the time of the visit.  We reviewed the resident's history and exam and pertinent patient test results.  I agree with the assessment, diagnosis, and plan of care documented in the resident's note.  

## 2022-02-26 NOTE — Telephone Encounter (Signed)
Please call patient regarding new insurance information. Thank you.

## 2022-03-02 ENCOUNTER — Other Ambulatory Visit (HOSPITAL_COMMUNITY): Payer: Self-pay

## 2022-03-02 ENCOUNTER — Telehealth: Payer: Self-pay | Admitting: Pharmacist

## 2022-03-02 NOTE — Telephone Encounter (Signed)
Returned call to patient. Her new information is through [facility]. Will need PA for Humira run.  Knox Saliva, PharmD, MPH, BCPS, CPP Clinical Pharmacist (Rheumatology and Pulmonology)

## 2022-03-02 NOTE — Telephone Encounter (Signed)
Patient has change in insurance. Submitted a Prior Authorization request to [facility] for HUMIRA via CoverMyMeds. Will update once we receive a response.  Key: [facility]  Once approved, will need to determine if she has to change specialty pharmacy.  Knox Saliva, PharmD, MPH, BCPS, CPP Clinical Pharmacist (Rheumatology and Pulmonology)

## 2022-03-09 ENCOUNTER — Other Ambulatory Visit (HOSPITAL_COMMUNITY): Payer: Self-pay

## 2022-03-09 NOTE — Telephone Encounter (Signed)
Received notification from  [facility]  regarding a prior authorization for [place]. Authorization has been APPROVED from 03/04/2022 to 03/05/2023. Approval letter sent to scan center.  Per test claim and approval letter, patient does not appear to be locked into any specialty pharmacy.  Patient  may be continue to  fill through [place]: [phone]  Authorization # [id number]  ATC patient to discuss. Unable to reach. Left VM advising patient to f/u with [facility] schedule shipment.  Knox Saliva, PharmD, MPH, BCPS, CPP Clinical Pharmacist (Rheumatology and Pulmonology)

## 2022-04-06 ENCOUNTER — Encounter: Payer: 59 | Admitting: Physical Medicine and Rehabilitation

## 2022-04-09 ENCOUNTER — Ambulatory Visit (HOSPITAL_COMMUNITY)
Admission: RE | Admit: 2022-04-09 | Discharge: 2022-04-09 | Disposition: A | Discharge status: Home / Self Care | Payer: 59 | Source: Ambulatory Visit | Attending: Internal Medicine | Admitting: Internal Medicine

## 2022-04-09 DIAGNOSIS — Z87891 Personal history of nicotine dependence: Secondary | ICD-10-CM | POA: Diagnosis present

## 2022-04-17 ENCOUNTER — Ambulatory Visit
Admission: RE | Admit: 2022-04-17 | Discharge: 2022-04-17 | Disposition: A | Discharge status: Home / Self Care | Payer: 59 | Source: Ambulatory Visit | Attending: Physical Medicine and Rehabilitation | Admitting: Physical Medicine and Rehabilitation

## 2022-04-17 ENCOUNTER — Encounter: Payer: 59 | Attending: Physical Medicine and Rehabilitation | Admitting: Physical Medicine and Rehabilitation

## 2022-04-17 ENCOUNTER — Encounter: Payer: Self-pay | Admitting: Physical Medicine and Rehabilitation

## 2022-04-17 VITALS — BP 144/82 | HR 102 | Ht 65.0 in | Wt 177.0 lb

## 2022-04-17 DIAGNOSIS — M545 Low back pain, unspecified: Secondary | ICD-10-CM

## 2022-04-17 DIAGNOSIS — G8929 Other chronic pain: Secondary | ICD-10-CM | POA: Diagnosis present

## 2022-04-17 DIAGNOSIS — Z79891 Long term (current) use of opiate analgesic: Secondary | ICD-10-CM | POA: Diagnosis present

## 2022-04-17 DIAGNOSIS — M0579 Rheumatoid arthritis with rheumatoid factor of multiple sites without organ or systems involvement: Secondary | ICD-10-CM

## 2022-04-17 DIAGNOSIS — G894 Chronic pain syndrome: Secondary | ICD-10-CM | POA: Diagnosis not present

## 2022-04-17 DIAGNOSIS — Z5181 Encounter for therapeutic drug level monitoring: Secondary | ICD-10-CM

## 2022-04-17 DIAGNOSIS — M797 Fibromyalgia: Secondary | ICD-10-CM

## 2022-04-17 DIAGNOSIS — M5416 Radiculopathy, lumbar region: Secondary | ICD-10-CM | POA: Diagnosis not present

## 2022-04-17 MED ORDER — CYCLOBENZAPRINE HCL 10 MG PO TABS: 10.0000 mg | ORAL_TABLET | Freq: Two times a day (BID) | ORAL | 1 refills | Status: DC | PRN

## 2022-04-17 MED ORDER — TRAMADOL HCL 50 MG PO TABS: 50.0000 mg | ORAL_TABLET | Freq: Four times a day (QID) | ORAL | 5 refills | Status: DC | PRN

## 2022-04-17 MED ORDER — AMANTADINE HCL 100 MG PO CAPS: 100.0000 mg | ORAL_CAPSULE | Freq: Every day | ORAL | 1 refills | Status: DC

## 2022-04-17 NOTE — Patient Instructions (Addendum)
Pt is a 67 yr old female with hx of RA- hands, elbows, shoulders and neck-  DJD of "entire back"; borderline diabetes; GERD; no HTN. Also has scoliosis 15-20 degrees thoracolumbar and lumbar radiculopathy based on xray results. Also has Fibromyalgia-  Here for f/u on RA and FMS.    Is taking a MVI- Vit D is 85- suggest 2000 units/day- to add to MVI- to try and get Vit D level up to mid range.   2. UDS- since haven't seen in 6 months.    3. Xray of lumbar spine- [facility]- walk in and get xray-    4. Core strengthening is needed to see if can get better control of back pain- wants to try to do strengthening exercises on her own for now- concern appropriately about cost- advised to do 5 days a week or MORE. 15-20 minutes/day  5. Exercise ball- sit on ball 30 minutes/day- try to replace dining room/couch for those 30 minutes/day.    6. Con't Tramadol- 50 mg up to 4x/day as needed #120- 5 refills.  Wrote refill  7. Tennis ball- or massage gun- 2-4 minutes- hold pressure- can roll around if needed to find right spot- no massage.   8. Refilled Amantadine 100 mg daily and Flexeril 10 mg 2x/day with refills.  9. Wait on MRI for NOW- and see how HEP does.   10. F/U in 3 months  11. Handicapped placard- needs for RA

## 2022-04-17 NOTE — Progress Notes (Signed)
Subjective:    Patient ID: Heidi Chapman, female    DOB: [birth year], 67 y.o.   MRN: [id number]  HPI: Pt is a 67 yr old female with hx of RA- hands, elbows, shoulders and neck-  DJD of "entire back"; borderline diabetes; GERD; no HTN. Also has scoliosis 15-20 degrees thoracolumbar and lumbar radiculopathy based on xray results. Also has Fibromyalgia-  Here for f/u on RA and FMS.    Still taking tramadol- tries to take at least 1 pill/day- some days 2-3 pills/day.   Had friend addicted to Tramadol in past.   Has been having a lot of flares in last 5-6 months.  When FMS flares, everything flares.  Will then take Tramadol, muscle relaxant and ibuprofen.    No side effects from Flexeril- going OK- 1-2x/day- usually.  Some times during flare, 4x/day, but not usually.    Still on Amantadine- 100 mg daily- can take 200 mg/day, but only takes 100 mg-  Felt wired when took 200 mg daily.    Going to come off Humira and do something else.   Chronic back pain- now there all the time Tries heat, ice packs- no difference in sitting, standing; walking- just hurts.  Recline position is the best, but stooping, getting OOB, getting out of car, it's the worst.   In lumbar spine, really hurts and radiates into lateral hips- and sometimes up to thoracic back and "then cannot walk anymore".  Hurts on either side of lumbar spine- and into buttocks.     Pain Inventory Average Pain 5 Pain Right Now 7 My pain is constant, sharp, burning, dull, stabbing, tingling, and aching  In the last 24 hours, has pain interfered with the following? General activity 7 Relation with others 5 Enjoyment of life 5 What TIME of day is your pain at its worst? daytime Sleep (in general) Fair  Pain is worse with: walking, bending, sitting, and standing Pain improves with: rest, heat/ice, pacing activities, and medication Relief from Meds: 6  Family History  Problem Relation Age of Onset   Cancer - Lung  Mother    Brain cancer Mother    Colon polyps Father    Prostate cancer Father    Diverticulitis Father    Heart attack Father    Cancer Maternal Grandmother    Cancer Maternal Grandfather    Cancer Paternal Grandfather    Social History   Socioeconomic History   Marital status: Legally Separated    Spouse name: Not on file   Number of children: Not on file   Years of education: Not on file   Highest education level: Not on file  Occupational History   Not on file  Tobacco Use   Smoking status: Former    Packs/day: 0.50    Years: 40.00    Total pack years: 20.00    Types: Cigarettes    Quit date: 02/14/2021    Years since quitting: 1.1    Passive exposure: Past   Smokeless tobacco: Never  Vaping Use   Vaping Use: Never used  Substance and Sexual Activity   Alcohol use: Yes    Comment: 1-3 drinks annually   Drug use: Never   Sexual activity: Not on file  Other Topics Concern   Not on file  Social History Narrative   Lives with youngest daughter and granddaughter   Right handed   Caffeine: 1 cup of coffee and 1 soda daily   Social Determinants of Health   Financial  Resource Strain: Not on file  Food Insecurity: Not on file  Transportation Needs: Not on file  Physical Activity: Not on file  Stress: Not on file  Social Connections: Not on file   Past Surgical History:  Procedure Laterality Date   CESAREAN SECTION     CHOLECYSTECTOMY N/A 10/24/2017   Procedure: [place] CHOLANGIOGRAM;  Surgeon: Erroll Luna, MD;  Location: [place];  Service: General;  Laterality: N/A;   GALLBLADDER SURGERY     TUBAL LIGATION     Past Surgical History:  Procedure Laterality Date   CESAREAN SECTION     CHOLECYSTECTOMY N/A 10/24/2017   Procedure: LAPAROSCOPIC CHOLECYSTECTOMY WITH INTRAOPERATIVE CHOLANGIOGRAM;  Surgeon: Erroll Luna, MD;  Location: [place];  Service: General;  Laterality: N/A;   GALLBLADDER SURGERY     TUBAL LIGATION      Past Medical History:  Diagnosis Date   Absence of both cervix and uterus, acquired 12/14/2011   Acalculous cholecystitis 10/23/2017   Acquired deformity of right hand 03/13/2021   Acute left-sided low back pain without sciatica 03/13/2019   Anxiety    Anxiety 01/26/2019   Arthritis    Benign neoplasm of colon 12/14/2011   Fatigue 06/28/2020   Fibromyalgia    GERD (gastroesophageal reflux disease)    Healthcare maintenance 12/06/2017   High risk medication use 03/13/2021   Lymphadenopathy 03/13/2021   Migraine    Neck pain 03/13/2021   Orthopnea 08/29/2020   Overweight 03/13/2021   Polyarthralgia 01/26/2019   Screening for colon cancer 06/28/2020   Screening mammogram for breast cancer 06/28/2020   Shortness of breath at rest 08/29/2020   Snoring 08/29/2020   BP (!) 144/82   Pulse (!) 102   Ht '5\' 5"'$  (1.651 m)   Wt 177 lb (80.3 kg)   SpO2 96%   BMI 29.45 kg/m   Opioid Risk Score:   Fall Risk Score:  `1  Depression screen [facility] 2/9     04/17/2022    1:36 PM 02/23/2022   10:40 AM 09/15/2021    9:27 AM 08/26/2021    2:37 PM 06/18/2021   11:00 AM 03/19/2021    9:35 AM 06/28/2020   12:08 PM  Depression screen PHQ 2/9  Decreased Interest 0  0 0 0 0 1  Down, Depressed, Hopeless 0 0 0 0 0 0 0  PHQ - 2 Score 0 0 0 0 0 0 1  Altered sleeping  '3    3 3  '$ Tired, decreased energy  '3    3 3  '$ Change in appetite  0    1 1  Feeling bad or failure about yourself   0    0 0  Trouble concentrating  3    3 0  Moving slowly or fidgety/restless  3    0 0  Suicidal thoughts  0    0 0  PHQ-9 Score  '12    10 8  '$ Difficult doing work/chores  Somewhat difficult    Somewhat difficult Somewhat difficult      Review of Systems  Musculoskeletal:  Positive for back pain.       B/L shoulder, elbow and arm pain   All other systems reviewed and are negative.     Objective:   Physical Exam Awake, alert, appropriate, NAD Sitting on table Lumbar flexion worse for pain than lumbar extension TTP across band  in low back along with palpable trigger points mainly on L lumbar paraspinals  Assessment & Plan:   Pt is a 67 yr old female with hx of RA- hands, elbows, shoulders and neck-  DJD of "entire back"; borderline diabetes; GERD; no HTN. Also has scoliosis 15-20 degrees thoracolumbar and lumbar radiculopathy based on xray results. Also has Fibromyalgia-  Here for f/u on RA and FMS.    Is taking a MVI- Vit D is 82- suggest 2000 units/day- to add to MVI- to try and get Vit D level up to mid range.   2. UDS- since haven't seen in 6 months.    3. Xray of lumbar spine- [facility]- walk in and get xray-    4. Core strengthening is needed to see if can get better control of back pain- wants to try to do strengthening exercises on her own for now- concern appropriately about cost- advised to do 5 days a week or MORE. 15-20 minutes/day  5. Exercise ball- sit on ball 30 minutes/day- try to replace dining room/couch for those 30 minutes/day.    6. Con't Tramadol- 50 mg up to 4x/day as needed #120- 5 refills.  Wrote refill  7. Tennis ball- or massage gun- 2-4 minutes- hold pressure- can roll around if needed to find right spot- no massage.   8. Refilled Amantadine 100 mg daily and Flexeril 10 mg 2x/day with refills.  9. Wait on MRI for NOW- and see how HEP does.   10. F/U in 3 months   11. Handicapped placard- 5 years.    I spent a total of  31  minutes on total care today- >50% coordination of care- due to discussion on HEP, exercise ball, tennis ball- and pain regimen

## 2022-04-20 ENCOUNTER — Other Ambulatory Visit: Payer: Self-pay | Admitting: Internal Medicine

## 2022-04-20 DIAGNOSIS — M0579 Rheumatoid arthritis with rheumatoid factor of multiple sites without organ or systems involvement: Secondary | ICD-10-CM

## 2022-04-20 DIAGNOSIS — Z79899 Other long term (current) drug therapy: Secondary | ICD-10-CM

## 2022-04-20 NOTE — Telephone Encounter (Signed)
Next Visit: 05/07/2022  Last Visit: 02/03/2022  Last Fill: 11/07/2021  [id number] arthritis involving multiple sites with positive rheumatoid factor   Current Dose per office note 02/03/2022: Humira 40 mg subcu q. 14 days.   Labs: 02/03/2022 Lab results look okay for continuing the Humira treatment.  Her liver enzyme tests show mild elevation in alkaline phosphatase and ALT but these are the same compared to 3 months ago when she was off the medication so I do not believe it is related.   TB Gold: 02/03/2022 Neg    Patient to update labs at upcoming appointment on 05/07/2022  Okay to refill Humira?

## 2022-04-21 ENCOUNTER — Telehealth: Payer: Self-pay

## 2022-04-21 NOTE — Telephone Encounter (Signed)
PA for Tramadol submitted

## 2022-04-22 ENCOUNTER — Telehealth: Payer: Self-pay | Admitting: *Deleted

## 2022-04-22 LAB — TOXASSURE SELECT,+ANTIDEPR,UR

## 2022-04-22 NOTE — Telephone Encounter (Signed)
Urine drug screen for this encounter is as expected.

## 2022-05-04 ENCOUNTER — Other Ambulatory Visit: Payer: Self-pay | Admitting: Physical Medicine and Rehabilitation

## 2022-05-06 ENCOUNTER — Telehealth: Payer: Self-pay | Admitting: *Deleted

## 2022-05-06 NOTE — Telephone Encounter (Signed)
We received an authorization request from your prescriber for coverage of traMADol HCl 50MG  OR TABS for you. This request has been deemed withdrawn because: We received a request for Tramadol. We reviewed the records received and your plan benefit coverage. The requested drug is covered by [place] without a prior authorization. The dispensing pharmacy may enter the appropriate clarification code(s) to override the rejection. Please speak to your doctor about your options for care.

## 2022-05-06 NOTE — Telephone Encounter (Signed)
PA submitted for [place] Key: [place]

## 2022-05-07 ENCOUNTER — Telehealth: Payer: Self-pay | Admitting: Pharmacist

## 2022-05-07 ENCOUNTER — Ambulatory Visit: Payer: 59 | Attending: Internal Medicine | Admitting: Internal Medicine

## 2022-05-07 ENCOUNTER — Encounter: Payer: Self-pay | Admitting: Internal Medicine

## 2022-05-07 VITALS — BP 129/78 | HR 96 | Resp 12 | Ht 64.0 in | Wt 193.0 lb

## 2022-05-07 DIAGNOSIS — M797 Fibromyalgia: Secondary | ICD-10-CM | POA: Diagnosis not present

## 2022-05-07 DIAGNOSIS — M4725 Other spondylosis with radiculopathy, thoracolumbar region: Secondary | ICD-10-CM

## 2022-05-07 DIAGNOSIS — Z79899 Other long term (current) drug therapy: Secondary | ICD-10-CM | POA: Diagnosis not present

## 2022-05-07 DIAGNOSIS — M0579 Rheumatoid arthritis with rheumatoid factor of multiple sites without organ or systems involvement: Secondary | ICD-10-CM

## 2022-05-07 NOTE — Progress Notes (Signed)
Office Visit Note  Patient: Heidi Chapman             Date of Birth: Sep 11, 1955           MRN: [id number]             PCP: Farrel Gordon, DO Referring: Farrel Gordon, DO Visit Date: 05/07/2022   Subjective:  Follow-up   History of Present Illness: ROSIO Chapman is a 67 y.o. female here for follow up for seropositive RA on Humira 40 mg subcu q. 14 days.  Since her last visit she has been on the medicine pretty consistently did have interruptus with some type of upper respiratory illness around 2 weeks ago.  She thinks this was similar to influenza but less severe.  Did not require antibiotics.  While taking the medicine without interruption sees a good benefit notices joint pain and stiffness worsening for about 3 to 4 days prior to each injection being due.  She is using a heat pack on her hands overnight also helps with the pain and stiffness.  Currently her back pain is the most limiting symptom she is following up with Dr. Alice Rieger for this on treatment with Flexeril and tramadol.  She had updated lumbar spine x-ray showing radiographic progression since 2019.  Currently working on a home exercise program for core strengthening during the past 2 weeks has not seen a significant improvement so far.   Previous HPI 02/03/22 Heidi Chapman is a 67 y.o. female here for follow up for seropositive RA on Humira 40 mg [place] q14 days. Since resuming the Humira she notices improvement in her hand stiffness and swelling although still has some pain. Particularly nodules on the back of her joints hurt a lot with any minor bump or pressure.    Previous HPI 11/06/21 Heidi Chapman is a 67 y.o. female here for follow up for seropositive RA. She was taking Humira 40 mg [place] q14days with pretty stable symptoms but has missed the last 2 doses due to change in insurance and needing medicine pre-approval again. She has increased joint pain and stiffness especially in hands and wrists.   Previous  HPI 08/06/2021 Heidi Chapman is a 67 y.o. female here for follow up for seropositive RA on Humira 40 mg [place] q14days started in March. She has generalized pain and fatigue related to fibromyalgia syndrome and DJD of her back, followed by Dr. Dagoberto Ligas currently on duloxetine and PRN tramadol. Symptoms are doing somewhat better. She still feels daily stiffness but is not seeing much swelling and pain is decreased in elbow and hands and can tightly close fists. She has had several URI type symptoms intermittently feels like she gets sick when her granddaughter brings germs home from school, no antibiotics treatment or pneumonia.   Previous HPI 06/04/21 Heidi Chapman is a 67 y.o. female here for follow up for seropositive RA on Humira 40 mg [place] q14days started last month. Previous MTX treatment was not resumed due to abnormal baseline LFTs. She felt improvement after about 3 days on prednisone taper. Inflammation remains better but came back somewhat, but no large swollen nodules on the neck recently. Duloxetine is helping her chronic pain overall but still hurts with any activity more than an hour. She followed up with her neurology for the sleep apnea and fatigue still very fatigued limiting her activity.   Previous HPI 03/13/21 Heidi Chapman is a 67 y.o. female here for seropositive rheumatoid arthritis which has  been treated with methotrexate 17.5 mg [place] weekly and simponi aria infusion and taking folic acid 3 mg daily. In addition to RA she suffers pain with OA including lumbar spine DJD and fibromyalgia pain treated with naproxen 500 mg PRN and flexeril.  She reports joint pain started since many years ago particularly with progressive pain and stiffness and then lateral deviation worsening in MCP joints of both hands.  She also had chronic osteoarthritis particularly in her back with sciatica and back pain at multiple levels.  She finally went for evaluation of the hand pain and deformities since about  3 years ago was diagnosed with seropositive rheumatoid arthritis.  Initial treatments tried include leflunomide which was not tolerated and Enbrel which did not have clinical response.  Oral methotrexate was not tolerable she had some degree of nausea with the subcutaneous methotrexate as well but not as bad.  While on treatment she felt disease control was very good mostly just had the chronic OA type symptoms not much swelling and no progression of deformities. She has been a patient with Dr. Trudie Reed needing to transfer care due to change in insurance status.  She is now off all of her medications since about a month ago and is noticing worsening joint pains again.  Particularly since 2 weeks ago she has much worse neck pain is developed lymph node swelling on the back of her skull and neck.  She went to the emergency department for this were no specific problems were found liver function test showed a mild elevation she was prescribed Keflex empirically for possible infection has not noticed any difference since taking this medicine. Besides joint pain and lymph node swelling she feels extremely fatigued overall.   DMARD Hx Enbrel primary nonresponder Simponi aria TOC Methotrexate [place] TOC Methotrexate PO GI intolerance Leflunomide not tolerated   Labs reviewed 02/2021 ESR 26 CBC wnl CMP AST 43 ALT 56 Alk phos 149   03/2019 TB neg HBV neg HCV neg   01/2019 RA 109 CCP >250   10/2017 HIV neg   Review of Systems  Constitutional:  Positive for fatigue.  HENT:  Positive for mouth dryness. Negative for mouth sores.   Eyes:  Positive for dryness.  Respiratory:  Positive for shortness of breath.   Cardiovascular:  Negative for chest pain and palpitations.  Gastrointestinal:  Negative for blood in stool, constipation and diarrhea.  Endocrine: Negative for increased urination.  Genitourinary:  Negative for involuntary urination.  Musculoskeletal:  Positive for joint pain, gait problem, joint  pain, joint swelling, myalgias, muscle weakness, morning stiffness, muscle tenderness and myalgias.  Skin:  Positive for sensitivity to sunlight. Negative for color change, rash and hair loss.  Allergic/Immunologic: Positive for susceptible to infections.  Neurological:  Positive for dizziness. Negative for headaches.  Hematological:  Negative for swollen glands.  Psychiatric/Behavioral:  Positive for sleep disturbance. Negative for depressed mood. The patient is nervous/anxious.     PMFS History:  Patient Active Problem List   Diagnosis Date Noted   Need for immunization against influenza 02/23/2022   Normocytic anemia 08/26/2021   Fibromyalgia 06/04/2021   Depression with anxiety 05/20/2021   Chronic bilateral back pain 03/19/2021   Other idiopathic scoliosis, thoracolumbar region 03/19/2021   Lumbar radiculopathy 03/19/2021   High risk medication use 03/13/2021   Body mass index (BMI) 30.0-30.9, adult 03/13/2021   Osteoarthritis 11/18/2020   Obstructive sleep apnea 08/29/2020   Chronic fatigue 06/28/2020   Hyperlipidemia 06/28/2020   Prediabetes 06/28/2020  History of tobacco abuse 06/28/2020   Rheumatoid arthritis involving multiple sites with positive rheumatoid factor ([place]) 03/13/2019   Healthcare maintenance 12/06/2017    Past Medical History:  Diagnosis Date   Absence of both cervix and uterus, acquired 12/14/2011   Acalculous cholecystitis 10/23/2017   Acquired deformity of right hand 03/13/2021   Acute left-sided low back pain without sciatica 03/13/2019   Anxiety    Anxiety 01/26/2019   Arthritis    Benign neoplasm of colon 12/14/2011   Fatigue 06/28/2020   Fibromyalgia    GERD (gastroesophageal reflux disease)    Healthcare maintenance 12/06/2017   High risk medication use 03/13/2021   Lymphadenopathy 03/13/2021   Migraine    Neck pain 03/13/2021   Orthopnea 08/29/2020   Overweight 03/13/2021   Polyarthralgia 01/26/2019   Screening for colon cancer 06/28/2020    Screening mammogram for breast cancer 06/28/2020   Shortness of breath at rest 08/29/2020   Snoring 08/29/2020    Family History  Problem Relation Age of Onset   Cancer - Lung Mother    Brain cancer Mother    Colon polyps Father    Prostate cancer Father    Diverticulitis Father    Heart attack Father    Cancer Maternal Grandmother    Cancer Maternal Grandfather    Cancer Paternal Grandfather    Past Surgical History:  Procedure Laterality Date   CESAREAN SECTION     CHOLECYSTECTOMY N/A 10/24/2017   Procedure: LAPAROSCOPIC CHOLECYSTECTOMY WITH INTRAOPERATIVE CHOLANGIOGRAM;  Surgeon: Erroll Luna, MD;  Location: [facility] OR;  Service: General;  Laterality: N/A;   GALLBLADDER SURGERY     TUBAL LIGATION     Social History   Social History Narrative   Lives with youngest daughter and granddaughter   Right handed   Caffeine: 1 cup of coffee and 1 soda daily   Immunization History  Administered Date(s) Administered   Influenza,inj,Quad PF,6+ Mos 01/26/2019, 11/18/2020, 02/23/2022   PFIZER(Purple Top)SARS-COV-2 Vaccination 05/13/2019, 06/07/2019, 10/30/2019     Objective: Vital Signs: BP 129/78 (BP Location: Left Arm, Patient Position: Sitting, Cuff Size: Normal)   Pulse 96   Resp 12   Ht 5\' 4"  (1.626 m)   Wt 193 lb (87.5 kg)   BMI 33.13 kg/m    Physical Exam Constitutional:      Appearance: She is obese.  Cardiovascular:     Rate and Rhythm: Normal rate and regular rhythm.  Pulmonary:     Effort: Pulmonary effort is normal.     Breath sounds: Normal breath sounds.  Lymphadenopathy:     Cervical: No cervical adenopathy.  Skin:    General: Skin is warm and dry.  Neurological:     Mental Status: She is alert.  Psychiatric:        Mood and Affect: Mood normal.      Musculoskeletal Exam:  Shoulders full ROM tenderness to pressure laterally on both sides, no palpable swelling Elbows full ROM no tenderness or swelling Wrists full ROM no tenderness or swelling Fingers  full ROM no synovitis, nodule overlying the left second MCP joint tender to pressure mobile with no erythema or warmth Tenderness to pressure across upper middle and lower back with increased muscle tone, some pain radiation Knees full ROM no tenderness or swelling Ankles full ROM no tenderness or swelling   CDAI Exam: CDAI Score: 7  Patient Global: 20 mm; Provider Global: 20 mm Swollen: 0 ; Tender: 3  Joint Exam 05/07/2022      Right  Left  Glenohumeral   Tender   Tender  MCP 2      Tender     Investigation: No additional findings.  Imaging: DG Lumbar Spine 2-3 Views  Result Date: 04/19/2022 CLINICAL DATA:  lumbar spine pain EXAM: LUMBAR SPINE - 2-3 VIEW COMPARISON:  October 23, 2017 FINDINGS: There are five non-rib bearing lumbar-type vertebral bodies. There is levocurvature of the upper lumbar spine. This is progressed since 2019. No spondylolisthesis. There is no evidence for acute fracture or subluxation. Mild intervertebral disc space height loss at L2-3, L3-4 and L4-5 mild multilevel endplate proliferative changes. Lower lumbar facet arthropathy. Post cholecystectomy. IMPRESSION: Mild multilevel degenerative changes of the lumbar spine with levocurvature of the upper lumbar spine. This is progressed since 2019. Electronically Signed   By: Valentino Saxon M.D.   On: 04/19/2022 20:53   CT CHEST LUNG CA SCREEN LOW DOSE W/O CM  Result Date: 04/10/2022 CLINICAL DATA:  67 year old female with 183 pack-year history of smoking. Lung cancer screening. EXAM: CT CHEST WITHOUT CONTRAST LOW-DOSE FOR LUNG CANCER SCREENING TECHNIQUE: Multidetector CT imaging of the chest was performed following the standard protocol without IV contrast. RADIATION DOSE REDUCTION: This exam was performed according to the departmental dose-optimization program which includes automated exposure control, adjustment of the mA and/or kV according to patient size and/or use of iterative reconstruction technique.  COMPARISON:  None Available. FINDINGS: Cardiovascular: The heart size is normal. No substantial pericardial effusion. No thoracic aortic aneurysm. Mediastinum/Nodes: No mediastinal lymphadenopathy. No evidence for gross hilar lymphadenopathy although assessment is limited by the lack of intravenous contrast on the current study. The esophagus has normal imaging features. There is no axillary lymphadenopathy. Lungs/Pleura: Centrilobular emphsyema noted. No suspicious pulmonary nodule or mass. No focal airspace consolidation. No pleural effusion. Subsegmental atelectasis or linear scarring noted at both lung bases. Upper Abdomen: Visualized portion of the upper abdomen is unremarkable. Musculoskeletal: No worrisome lytic or sclerotic osseous abnormality. IMPRESSION: 1. Lung-RADS 1, negative. Continue annual screening with low-dose chest CT without contrast in 12 months. 2.  Emphysema. [id number]) Electronically Signed   By: Misty Stanley M.D.   On: 04/10/2022 07:55    Recent Labs: Lab Results  Component Value Date   WBC 6.0 02/03/2022   HGB 16.4 (H) 02/03/2022   PLT 270 02/03/2022   NA 141 02/03/2022   K 4.7 02/03/2022   CL 105 02/03/2022   CO2 26 02/03/2022   GLUCOSE 116 (H) 02/03/2022   BUN 14 02/03/2022   CREATININE 0.90 02/03/2022   BILITOT 1.3 (H) 02/03/2022   ALKPHOS 156 (H) 08/17/2021   AST 26 02/03/2022   ALT 38 (H) 02/03/2022   PROT 7.9 02/03/2022   ALBUMIN 3.1 (L) 08/17/2021   CALCIUM 10.5 (H) 02/03/2022   GFRAA 84 01/26/2019   QFTBGOLDPLUS NEGATIVE 02/03/2022    Speciality Comments: Leflunomide - intolerance; Enbrel - inadequate response; oral MTX - intolerable side effects; SImponi Aria infusions stopped in Jan 2023 and switched to Humira for convenience of self-injection; MTX stopped in Feb 2023 due to elevated LFTs Humira started 04/17/21  Procedures:  No procedures performed Allergies: Azithromycin, Leflunomide, Peanut butter flavor, and Latex   Assessment / Plan:      Visit Diagnoses: Rheumatoid arthritis involving multiple sites with positive rheumatoid factor ([place]) - Plan: Sedimentation rate  Seropositive RA appears mostly under control today. Rechecking sedimentation rate for disease activity monitoring.  Describing increase symptoms between Humira dosing we will consider rechecking trough level or antidrug antibodies if this becomes  more prominent to consider switching versus change in spacing.   Plan to continue Humira 40 mg subcu q. 14 days.  Osteoarthritis of spine with radiculopathy, thoracolumbar region  Currently her bigger complaint and I do not think is associated with increased inflammatory activity.  She has ongoing follow-up has been getting very good benefit working with Dr. Alice Rieger on this.  Still too early 2 weeks into home exercise program encouraged her to continue with this for a longer duration to see if there is benefit versus proceeding to formal physical therapy or needing more advanced imaging or procedure intervention.  Fibromyalgia  Pretty extensive proximal muscle soreness with some increased tone or spasticity also related to underlying degenerative arthritis in the spine.  Still with fatigue and somewhat disrupted sleep but no severe flareup of systemic symptoms or generally sensitivity.  High risk medication use - Plan: CBC with Differential/Platelet, COMPLETE METABOLIC PANEL WITH GFR  Checking CBC and CMP for medication monitoring on continued Humira.  Most recent QuantiFERON screening in December was negative.  She had 1 upper respiratory illness since last visit without complication.  Orders: Orders Placed This Encounter  Procedures   Sedimentation rate   CBC with Differential/Platelet   COMPLETE METABOLIC PANEL WITH GFR   No orders of the defined types were placed in this encounter.    Follow-Up Instructions: Return in about 3 months (around 08/07/2022) for RA on Humira f/u 43mos.   Collier Salina, MD  Note -  This record has been created using [employer].  Chart creation errors have been sought, but may not always  have been located. Such creation errors do not reflect on  the standard of medical care.

## 2022-05-07 NOTE — Telephone Encounter (Signed)
Patient had OV today and will be changing insurance from commercial or Medicare on 05/18/2022. She brought in new insurance information and completed PAP application.  Provider portion placed in Dr. Marveen Reeks folder for signature.  New insurance info [facility] medicare) ID: [id number] Group: [id number] [id number] PCN: [id number]  Please start PA through Medicare plan on or after 05/18/2022  Knox Saliva, PharmD, MPH, BCPS, CPP Clinical Pharmacist (Rheumatology and Pulmonology)

## 2022-05-08 LAB — COMPLETE METABOLIC PANEL WITH GFR
AG Ratio: 1.3 (calc) (ref 1.0–2.5)
ALT: 24 U/L (ref 6–29)
AST: 23 U/L (ref 10–35)
Albumin: 4.3 g/dL (ref 3.6–5.1)
Alkaline phosphatase (APISO): 181 U/L — ABNORMAL HIGH (ref 37–153)
BUN: 15 mg/dL (ref 7–25)
CO2: 25 mmol/L (ref 20–32)
Calcium: 10 mg/dL (ref 8.6–10.4)
Chloride: 106 mmol/L (ref 98–110)
Creat: 0.9 mg/dL (ref 0.50–1.05)
Globulin: 3.2 g/dL (calc) (ref 1.9–3.7)
Glucose, Bld: 120 mg/dL — ABNORMAL HIGH (ref 65–99)
Potassium: 4.5 mmol/L (ref 3.5–5.3)
Sodium: 141 mmol/L (ref 135–146)
Total Bilirubin: 1.5 mg/dL — ABNORMAL HIGH (ref 0.2–1.2)
Total Protein: 7.5 g/dL (ref 6.1–8.1)
eGFR: 71 mL/min/{1.73_m2} (ref >=60)

## 2022-05-08 LAB — CBC WITH DIFFERENTIAL/PLATELET
Absolute Monocytes: 595 cells/uL (ref 200–950)
Basophils Absolute: 77 cells/uL (ref 0–200)
Basophils Relative: 1.2 %
Eosinophils Absolute: 166 cells/uL (ref 15–500)
Eosinophils Relative: 2.6 %
HCT: 46.5 % — ABNORMAL HIGH (ref 35.0–45.0)
Hemoglobin: 16 g/dL — ABNORMAL HIGH (ref 11.7–15.5)
Lymphs Abs: 2093 cells/uL (ref 850–3900)
MCH: 30.2 pg (ref 27.0–33.0)
MCHC: 34.4 g/dL (ref 32.0–36.0)
MCV: 87.7 fL (ref 80.0–100.0)
MPV: 10.6 fL (ref 7.5–12.5)
Monocytes Relative: 9.3 %
Neutro Abs: 3469 cells/uL (ref 1500–7800)
Neutrophils Relative %: 54.2 %
Platelets: 280 10*3/uL (ref 140–400)
RBC: 5.3 10*6/uL — ABNORMAL HIGH (ref 3.80–5.10)
RDW: 13 % (ref 11.0–15.0)
Total Lymphocyte: 32.7 %
WBC: 6.4 10*3/uL (ref 3.8–10.8)

## 2022-05-08 LAB — SEDIMENTATION RATE: Sed Rate: 28 mm/h (ref 0–30)

## 2022-05-11 NOTE — Telephone Encounter (Signed)
Received signed provider portion of Humira PAP application  Knox Saliva, PharmD, MPH, BCPS, CPP Clinical Pharmacist (Rheumatology and Pulmonology)

## 2022-05-18 ENCOUNTER — Other Ambulatory Visit (HOSPITAL_COMMUNITY): Payer: Self-pay

## 2022-05-18 NOTE — Telephone Encounter (Signed)
Submitted a Prior Authorization request to [facility] for HUMIRA via CoverMyMeds. Will update once we receive a response.  Key: Toribio Harbour, PharmD, MPH, BCPS, CPP Clinical Pharmacist (Rheumatology and Pulmonology)

## 2022-05-18 NOTE — Telephone Encounter (Signed)
Received notification from [facility] regarding a prior authorization for [place]. Authorization has been APPROVED from 05/18/22 to 02/16/23. Approval letter sent to scan center.  Per test claim, copay for 28 days supply is $2105.29  Authorization # [id number]  Submitted Patient Assistance Application to [employer] for [place] along with provider portion, PA and income documents. Will update patient when we receive a response.  Phone: [phone] Fax: [phone]  Knox Saliva, PharmD, MPH, BCPS, CPP Clinical Pharmacist (Rheumatology and Pulmonology)

## 2022-05-22 ENCOUNTER — Telehealth: Payer: Self-pay | Admitting: *Deleted

## 2022-05-22 ENCOUNTER — Ambulatory Visit: Payer: 59 | Admitting: Physical Medicine and Rehabilitation

## 2022-05-22 NOTE — Telephone Encounter (Signed)
Patient contacted the office and left message stating she would like a call back regarding the Humira. Patient's call back number [phone].

## 2022-05-26 NOTE — Telephone Encounter (Signed)
Returned call to patient - unable to reach. Left VM requesting return call if needed  Chesley Mires, PharmD, MPH, BCPS, CPP Clinical Pharmacist (Rheumatology and Pulmonology)

## 2022-05-26 NOTE — Telephone Encounter (Addendum)
Called [employer] for update on patient's Humira application. Per rep, nothing is missing with application. Turnaround time is 7-10 business days from day of submission  Chesley Mires, PharmD, MPH, BCPS, CPP Clinical Pharmacist (Rheumatology and Pulmonology)

## 2022-05-26 NOTE — Telephone Encounter (Signed)
Spoke with patient . She was requesting update on Humira access. Advised that we are waiting for pt assistance application determination. She has been advised of Humira copay through insurance.  Chesley Mires, PharmD, MPH, BCPS, CPP Clinical Pharmacist (Rheumatology and Pulmonology)

## 2022-05-29 ENCOUNTER — Telehealth: Payer: Self-pay | Admitting: Physical Medicine and Rehabilitation

## 2022-05-29 NOTE — Telephone Encounter (Signed)
Patient returning Dr. Dahlia Client call. Patient will be available this afternoon or all day Monday to discuss paperwork to be filled.

## 2022-06-01 ENCOUNTER — Telehealth: Payer: Self-pay

## 2022-06-01 NOTE — Telephone Encounter (Signed)
PA submitted for Tramadol, waiting for questions

## 2022-06-05 NOTE — Telephone Encounter (Signed)
Called [employer] for update on patient's Humira application. Per rep, on 05/28/22 they sent a letter in the mail stating that patient's income was not able to be pulled. They need verification from patient regarding household size and income. She needs to call [employer] and verify household size but she will also need to send in proof of income.  Phone: [phone]  Spoke with patient regarding this - she will call [employer] regardig household income. She will stop by Monday to drop off proof of income to submit to Derald Macleod, PharmD, MPH, BCPS, CPP Clinical Pharmacist (Rheumatology and Pulmonology)

## 2022-06-08 ENCOUNTER — Telehealth: Payer: Self-pay

## 2022-06-08 NOTE — Telephone Encounter (Signed)
Patient contacted the office stating she called [employer] and let them know she is a single household and uploaded her W2 for income. Patient states everything should be ready for the patient assistance application. Patient states someone should contact the pharmacist with an answer. Patient call back number is [phone]. Please advise.

## 2022-06-09 NOTE — Telephone Encounter (Signed)
Returned call to patient regarding this. She states that she provided information and income directly to [place]. Will await follow-up from program  Chesley Mires, PharmD, MPH, BCPS, CPP Clinical Pharmacist (Rheumatology and Pulmonology)

## 2022-06-09 NOTE — Telephone Encounter (Signed)
Patient submitted income proof directly to [place] online and spoke with [place] regarding household size. Will await f/u from program  Chesley Mires, PharmD, MPH, BCPS, CPP Clinical Pharmacist (Rheumatology and Pulmonology)

## 2022-06-12 NOTE — Telephone Encounter (Signed)
Received a fax from  [employer] regarding an approval for HUMIRA patient assistance from 06/11/22 to 02/16/23. Approval letter sent to scan center.  Phone: [phone]  ATC patient to discuss. Unable to reach. Left VM advising her to call [employer] if she has not heard from them by next Tuesday. Nothing further should be needed. MyChart message sent  Chesley Mires, PharmD, MPH, BCPS, CPP Clinical Pharmacist (Rheumatology and Pulmonology)

## 2022-07-17 ENCOUNTER — Encounter: Payer: Self-pay | Admitting: Physical Medicine and Rehabilitation

## 2022-07-17 ENCOUNTER — Encounter: Payer: Medicare HMO | Attending: Physical Medicine and Rehabilitation | Admitting: Physical Medicine and Rehabilitation

## 2022-07-17 VITALS — BP 117/80 | HR 105 | Ht 64.0 in | Wt 191.0 lb

## 2022-07-17 DIAGNOSIS — M5416 Radiculopathy, lumbar region: Secondary | ICD-10-CM | POA: Diagnosis not present

## 2022-07-17 DIAGNOSIS — M797 Fibromyalgia: Secondary | ICD-10-CM

## 2022-07-17 DIAGNOSIS — M0579 Rheumatoid arthritis with rheumatoid factor of multiple sites without organ or systems involvement: Secondary | ICD-10-CM

## 2022-07-17 MED ORDER — PROMETHAZINE HCL 12.5 MG PO TABS: ORAL_TABLET | ORAL | 5 refills | Status: DC

## 2022-07-17 NOTE — Patient Instructions (Signed)
Pt is a 67 yr old female with hx of RA- hands, elbows, shoulders and neck-  DJD of "entire back"; borderline diabetes; GERD; no HTN. Also has scoliosis 15-20 degrees thoracolumbar and lumbar radiculopathy based on xray results. Also has Fibromyalgia-  Here for f/u on RA and FMS.      ONCE  pain better controlled, then try to get back to trying the exercise ball- 10-20 minutes/day initially and work up SLOWLY.  2. Went over xray of lumbar spine done 04/17/22- and has a little more worse scoliosis than 2019- - but not severe.   3. Wait on MRI of lumbar spine until Humira has kicked in and working better- then can reassess.   4. Needs phenergan prior auth- will send in again so will trigger prior auth-   5. Con't Tramadol- but not due to get refilled til mid August.   6. Con't Flexeril- doesn't need refills.   7. Con't Amantadine -doesn't need refills.   8. Con't Vit D 2000 units/day.   9 . Last did UDS in March 2024- so not due today.   10. F/U in 3months- on FMS, RA

## 2022-07-17 NOTE — Progress Notes (Signed)
Subjective:    Patient ID: Heidi Chapman, female    DOB: [birth year], 67 y.o.   MRN: [id number]  HPI Pt is a 67 yr old female with hx of RA- hands, elbows, shoulders and neck-  DJD of "entire back"; borderline diabetes; GERD; no HTN. Also has scoliosis 15-20 degrees thoracolumbar and lumbar radiculopathy based on xray results. Also has Fibromyalgia-  Here for f/u on RA and FMS.     Still taking Tramadol, but up to 3x/day lately.  More flare lately.-  Doing Flexeril- 2x/day.    Still taking Amantadine and Vit D supplement- doing  well with fatigue- much better Still gets fatigue,sedation, but only lasts 45 minutes as compared to 3+ hours.  Better than everything else- Vit D than anything else to treat fatigue.   Back on Humira, decided to stay with that.- 2nd injection last week - do every other week.  2nd since January, so that's also why she's hurting more.   Especially hands, fingers and arms/elbows are hurting.  Switched insurance in [place] and in April to Medicare, and pre-authoriazations were awful to restart it.   Tried exercise ball- for 7-10 days, caused more pain than seemed to help.    Didn't try tennis ball- forgot to get them.   Has quite a few phenergan  Taking 2000 units of Vit D daily- which is working well  Going off disability and going on surviving spouse medicare.    Pain Inventory Average Pain 6 Pain Right Now 4 My pain is constant, stabbing, and aching  In the last 24 hours, has pain interfered with the following? General activity 6 Relation with others 6 Enjoyment of life 6 What TIME of day is your pain at its worst? evening Sleep (in general) Fair  Pain is worse with: walking, bending, sitting, inactivity, standing, and some activites Pain improves with: rest, heat/ice, pacing activities, and medication Relief from Meds: 5  Family History  Problem Relation Age of Onset   Cancer - Lung Mother    Brain cancer Mother    Colon polyps  Father    Prostate cancer Father    Diverticulitis Father    Heart attack Father    Cancer Maternal Grandmother    Cancer Maternal Grandfather    Cancer Paternal Grandfather    Social History   Socioeconomic History   Marital status: Widowed    Spouse name: Not on file   Number of children: Not on file   Years of education: Not on file   Highest education level: Not on file  Occupational History   Not on file  Tobacco Use   Smoking status: Former    Packs/day: 0.50    Years: 40.00    Additional pack years: 0.00    Total pack years: 20.00    Types: Cigarettes    Quit date: 02/14/2021    Years since quitting: 1.4    Passive exposure: Past   Smokeless tobacco: Never  Vaping Use   Vaping Use: Never used  Substance and Sexual Activity   Alcohol use: Yes    Comment: 1-3 drinks annually   Drug use: Never   Sexual activity: Not on file  Other Topics Concern   Not on file  Social History Narrative   Lives with youngest daughter and granddaughter   Right handed   Caffeine: 1 cup of coffee and 1 soda daily   Social Determinants of Health   Financial Resource Strain: Not on file  Food Insecurity:  Not on file  Transportation Needs: Not on file  Physical Activity: Not on file  Stress: Not on file  Social Connections: Not on file   Past Surgical History:  Procedure Laterality Date   CESAREAN SECTION     CHOLECYSTECTOMY N/A 10/24/2017   Procedure: LAPAROSCOPIC CHOLECYSTECTOMY WITH INTRAOPERATIVE CHOLANGIOGRAM;  Surgeon: Harriette Bouillon, MD;  Location: [facility] OR;  Service: General;  Laterality: N/A;   GALLBLADDER SURGERY     TUBAL LIGATION     Past Surgical History:  Procedure Laterality Date   CESAREAN SECTION     CHOLECYSTECTOMY N/A 10/24/2017   Procedure: LAPAROSCOPIC CHOLECYSTECTOMY WITH INTRAOPERATIVE CHOLANGIOGRAM;  Surgeon: Harriette Bouillon, MD;  Location: [facility] OR;  Service: General;  Laterality: N/A;   GALLBLADDER SURGERY     TUBAL LIGATION     Past Medical  History:  Diagnosis Date   Absence of both cervix and uterus, acquired 12/14/2011   Acalculous cholecystitis 10/23/2017   Acquired deformity of right hand 03/13/2021   Acute left-sided low back pain without sciatica 03/13/2019   Anxiety    Anxiety 01/26/2019   Arthritis    Benign neoplasm of colon 12/14/2011   Fatigue 06/28/2020   Fibromyalgia    GERD (gastroesophageal reflux disease)    Healthcare maintenance 12/06/2017   High risk medication use 03/13/2021   Lymphadenopathy 03/13/2021   Migraine    Neck pain 03/13/2021   Orthopnea 08/29/2020   Overweight 03/13/2021   Polyarthralgia 01/26/2019   Screening for colon cancer 06/28/2020   Screening mammogram for breast cancer 06/28/2020   Shortness of breath at rest 08/29/2020   Snoring 08/29/2020   BP 117/80   Pulse (!) 105   Ht 5\' 4"  (1.626 m)   Wt 191 lb (86.6 kg)   SpO2 95%   BMI 32.79 kg/m   Opioid Risk Score:   Fall Risk Score:  `1  Depression screen [facility] 2/9     07/17/2022   10:11 AM 04/17/2022    1:36 PM 02/23/2022   10:40 AM 09/15/2021    9:27 AM 08/26/2021    2:37 PM 06/18/2021   11:00 AM 03/19/2021    9:35 AM  Depression screen [facility] 2/9  Decreased Interest 0 0  0 0 0 0  Down, Depressed, Hopeless 0 0 0 0 0 0 0  PHQ - 2 Score 0 0 0 0 0 0 0  Altered sleeping   3    3  Tired, decreased energy   3    3  Change in appetite   0    1  Feeling bad or failure about yourself    0    0  Trouble concentrating   3    3  Moving slowly or fidgety/restless   3    0  Suicidal thoughts   0    0  PHQ-9 Score   12    10  Difficult doing work/chores   Somewhat difficult    Somewhat difficult      Review of Systems  Musculoskeletal:  Positive for back pain.       B/L hand pain   All other systems reviewed and are negative.     Objective:   Physical Exam Awake, alert, appropriate, dressing well, NAD Mild effusions of MCP's puffy       Assessment & Plan:   Pt is a 67 yr old female with hx of RA- hands, elbows, shoulders and neck-   DJD of "entire back"; borderline diabetes; GERD; no HTN. Also  has scoliosis 15-20 degrees thoracolumbar and lumbar radiculopathy based on xray results. Also has Fibromyalgia-  Here for f/u on RA and FMS.      ONCE  pain better controlled, then try to get back to trying the exercise ball- 10-20 minutes/day initially and work up SLOWLY.  2. Went over xray of lumbar spine done 04/17/22- and has a little more worse scoliosis than 2019- - but not severe.   3. Wait on MRI of lumbar spine until Humira has kicked in and working better- then can reassess.   4. Needs phenergan prior auth- will send in again so will trigger prior auth-   5. Con't Tramadol- but not due to get refilled til mid August.   6. Con't Flexeril- doesn't need refills.   7. Con't Amantadine -doesn't need refills.   8. Con't Vit D 2000 units/day.   9 . Last did UDS in March 2024- so not due today.   10. F/U in 3 months- on FMS, RA   I spent a total of 21   minutes on total care today- >50% coordination of care- due to  discussing pain meds and fatigue as well as RA.

## 2022-08-07 NOTE — Progress Notes (Unsigned)
Office Visit Note  Patient: Heidi Chapman             Date of Birth: 1955/05/23           MRN: [id number]             PCP: Champ Mungo, DO Referring: Champ Mungo, DO Visit Date: 08/11/2022   Subjective:  No chief complaint on file.   History of Present Illness: Heidi Chapman is a 67 y.o. female here for follow up for seropositive RA on Humira 40 mg subcu q. 14 days.    Previous HPI 05/07/2022 Heidi Chapman is a 67 y.o. female here for follow up for seropositive RA on Humira 40 mg subcu q. 14 days.  Since her last visit she has been on the medicine pretty consistently did have interruptus with some type of upper respiratory illness around 2 weeks ago.  She thinks this was similar to influenza but less severe.  Did not require antibiotics.  While taking the medicine without interruption sees a good benefit notices joint pain and stiffness worsening for about 3 to 4 days prior to each injection being due.  She is using a heat pack on her hands overnight also helps with the pain and stiffness.  Currently her back pain is the most limiting symptom she is following up with Dr. Alice Reichert for this on treatment with Flexeril and tramadol.  She had updated lumbar spine x-ray showing radiographic progression since 2019.  Currently working on a home exercise program for core strengthening during the past 2 weeks has not seen a significant improvement so far.     Previous HPI 02/03/22 Heidi Chapman is a 67 y.o. female here for follow up for seropositive RA on Humira 40 mg [place] q14 days. Since resuming the Humira she notices improvement in her hand stiffness and swelling although still has some pain. Particularly nodules on the back of her joints hurt a lot with any minor bump or pressure.    Previous HPI 11/06/21 Heidi Chapman is a 67 y.o. female here for follow up for seropositive RA. She was taking Humira 40 mg [place] q14days with pretty stable symptoms but has missed the last 2 doses due to  change in insurance and needing medicine pre-approval again. She has increased joint pain and stiffness especially in hands and wrists.   Previous HPI 08/06/2021 Heidi Chapman is a 67 y.o. female here for follow up for seropositive RA on Humira 40 mg [place] q14days started in March. She has generalized pain and fatigue related to fibromyalgia syndrome and DJD of her back, followed by Dr. Berline Chough currently on duloxetine and PRN tramadol. Symptoms are doing somewhat better. She still feels daily stiffness but is not seeing much swelling and pain is decreased in elbow and hands and can tightly close fists. She has had several URI type symptoms intermittently feels like she gets sick when her granddaughter brings germs home from school, no antibiotics treatment or pneumonia.   Previous HPI 06/04/21 Heidi Chapman is a 67 y.o. female here for follow up for seropositive RA on Humira 40 mg [place] q14days started last month. Previous MTX treatment was not resumed due to abnormal baseline LFTs. She felt improvement after about 3 days on prednisone taper. Inflammation remains better but came back somewhat, but no large swollen nodules on the neck recently. Duloxetine is helping her chronic pain overall but still hurts with any activity more than an hour. She followed up  with her neurology for the sleep apnea and fatigue still very fatigued limiting her activity.   Previous HPI 03/13/21 Heidi Chapman is a 67 y.o. female here for seropositive rheumatoid arthritis which has been treated with methotrexate 17.5 mg [place] weekly and simponi aria infusion and taking folic acid 3 mg daily. In addition to RA she suffers pain with OA including lumbar spine DJD and fibromyalgia pain treated with naproxen 500 mg PRN and flexeril.  She reports joint pain started since many years ago particularly with progressive pain and stiffness and then lateral deviation worsening in MCP joints of both hands.  She also had chronic osteoarthritis  particularly in her back with sciatica and back pain at multiple levels.  She finally went for evaluation of the hand pain and deformities since about 3 years ago was diagnosed with seropositive rheumatoid arthritis.  Initial treatments tried include leflunomide which was not tolerated and Enbrel which did not have clinical response.  Oral methotrexate was not tolerable she had some degree of nausea with the subcutaneous methotrexate as well but not as bad.  While on treatment she felt disease control was very good mostly just had the chronic OA type symptoms not much swelling and no progression of deformities. She has been a patient with Dr. Nickola Major needing to transfer care due to change in insurance status.  She is now off all of her medications since about a month ago and is noticing worsening joint pains again.  Particularly since 2 weeks ago she has much worse neck pain is developed lymph node swelling on the back of her skull and neck.  She went to the emergency department for this were no specific problems were found liver function test showed a mild elevation she was prescribed Keflex empirically for possible infection has not noticed any difference since taking this medicine. Besides joint pain and lymph node swelling she feels extremely fatigued overall.   DMARD Hx Enbrel primary nonresponder Simponi aria TOC Methotrexate [place] TOC Methotrexate PO GI intolerance Leflunomide not tolerated   Labs reviewed 02/2021 ESR 26 CBC wnl CMP AST 43 ALT 56 Alk phos 149   03/2019 TB neg HBV neg HCV neg   01/2019 RA 109 CCP >250   10/2017 HIV neg   No Rheumatology ROS completed.   PMFS History:  Patient Active Problem List   Diagnosis Date Noted   Need for immunization against influenza 02/23/2022   Normocytic anemia 08/26/2021   Fibromyalgia 06/04/2021   Depression with anxiety 05/20/2021   Chronic bilateral back pain 03/19/2021   Other idiopathic scoliosis, thoracolumbar region  03/19/2021   Lumbar radiculopathy 03/19/2021   High risk medication use 03/13/2021   Body mass index (BMI) 30.0-30.9, adult 03/13/2021   Osteoarthritis 11/18/2020   Obstructive sleep apnea 08/29/2020   Chronic fatigue 06/28/2020   Hyperlipidemia 06/28/2020   Prediabetes 06/28/2020   History of tobacco abuse 06/28/2020   Rheumatoid arthritis involving multiple sites with positive rheumatoid factor (HCC) 03/13/2019   Healthcare maintenance 12/06/2017    Past Medical History:  Diagnosis Date   Absence of both cervix and uterus, acquired 12/14/2011   Acalculous cholecystitis 10/23/2017   Acquired deformity of right hand 03/13/2021   Acute left-sided low back pain without sciatica 03/13/2019   Anxiety    Anxiety 01/26/2019   Arthritis    Benign neoplasm of colon 12/14/2011   Fatigue 06/28/2020   Fibromyalgia    GERD (gastroesophageal reflux disease)    Healthcare maintenance 12/06/2017   High risk  medication use 03/13/2021   Lymphadenopathy 03/13/2021   Migraine    Neck pain 03/13/2021   Orthopnea 08/29/2020   Overweight 03/13/2021   Polyarthralgia 01/26/2019   Screening for colon cancer 06/28/2020   Screening mammogram for breast cancer 06/28/2020   Shortness of breath at rest 08/29/2020   Snoring 08/29/2020    Family History  Problem Relation Age of Onset   Cancer - Lung Mother    Brain cancer Mother    Colon polyps Father    Prostate cancer Father    Diverticulitis Father    Heart attack Father    Cancer Maternal Grandmother    Cancer Maternal Grandfather    Cancer Paternal Grandfather    Past Surgical History:  Procedure Laterality Date   CESAREAN SECTION     CHOLECYSTECTOMY N/A 10/24/2017   Procedure: LAPAROSCOPIC CHOLECYSTECTOMY WITH INTRAOPERATIVE CHOLANGIOGRAM;  Surgeon: Harriette Bouillon, MD;  Location: [facility] OR;  Service: General;  Laterality: N/A;   GALLBLADDER SURGERY     TUBAL LIGATION     Social History   Social History Narrative   Lives with youngest daughter and  granddaughter   Right handed   Caffeine: 1 cup of coffee and 1 soda daily   Immunization History  Administered Date(s) Administered   Influenza,inj,Quad PF,6+ Mos 01/26/2019, 11/18/2020, 02/23/2022   PFIZER(Purple Top)SARS-COV-2 Vaccination 05/13/2019, 06/07/2019, 10/30/2019     Objective: Vital Signs: There were no vitals taken for this visit.   Physical Exam   Musculoskeletal Exam: ***  CDAI Exam: CDAI Score: -- Patient Global: --; Provider Global: -- Swollen: --; Tender: -- Joint Exam 08/11/2022   No joint exam has been documented for this visit   There is currently no information documented on the homunculus. Go to the Rheumatology activity and complete the homunculus joint exam.  Investigation: No additional findings.  Imaging: No results found.  Recent Labs: Lab Results  Component Value Date   WBC 6.4 05/07/2022   HGB 16.0 (H) 05/07/2022   PLT 280 05/07/2022   NA 141 05/07/2022   K 4.5 05/07/2022   CL 106 05/07/2022   CO2 25 05/07/2022   GLUCOSE 120 (H) 05/07/2022   BUN 15 05/07/2022   CREATININE 0.90 05/07/2022   BILITOT 1.5 (H) 05/07/2022   ALKPHOS 156 (H) 08/17/2021   AST 23 05/07/2022   ALT 24 05/07/2022   PROT 7.5 05/07/2022   ALBUMIN 3.1 (L) 08/17/2021   CALCIUM 10.0 05/07/2022   GFRAA 84 01/26/2019   QFTBGOLDPLUS NEGATIVE 02/03/2022    Speciality Comments: Leflunomide - intolerance; Enbrel - inadequate response; oral MTX - intolerable side effects; SImponi Aria infusions stopped in Jan 2023 and switched to Humira for convenience of self-injection; MTX stopped in Feb 2023 due to elevated LFTs Humira started 04/17/21  Procedures:  No procedures performed Allergies: Azithromycin, Leflunomide, Peanut butter flavor, and Latex   Assessment / Plan:     Visit Diagnoses: No diagnosis found.  ***  Orders: No orders of the defined types were placed in this encounter.  No orders of the defined types were placed in this encounter.    Follow-Up  Instructions: No follow-ups on file.   Ellen Henri, CMA  Note - This record has been created using Animal nutritionist.  Chart creation errors have been sought, but may not always  have been located. Such creation errors do not reflect on  the standard of medical care.

## 2022-08-11 ENCOUNTER — Ambulatory Visit: Payer: Medicare HMO | Attending: Internal Medicine | Admitting: Internal Medicine

## 2022-08-11 ENCOUNTER — Encounter: Payer: Self-pay | Admitting: Internal Medicine

## 2022-08-11 VITALS — BP 119/78 | HR 102 | Resp 14 | Ht 65.0 in | Wt 190.0 lb

## 2022-08-11 DIAGNOSIS — M4725 Other spondylosis with radiculopathy, thoracolumbar region: Secondary | ICD-10-CM

## 2022-08-11 DIAGNOSIS — M0579 Rheumatoid arthritis with rheumatoid factor of multiple sites without organ or systems involvement: Secondary | ICD-10-CM | POA: Diagnosis not present

## 2022-08-11 DIAGNOSIS — M65312 Trigger thumb, left thumb: Secondary | ICD-10-CM | POA: Diagnosis not present

## 2022-08-11 DIAGNOSIS — Z79899 Other long term (current) drug therapy: Secondary | ICD-10-CM | POA: Diagnosis not present

## 2022-08-11 DIAGNOSIS — M797 Fibromyalgia: Secondary | ICD-10-CM

## 2022-08-12 LAB — CBC WITH DIFFERENTIAL/PLATELET
Absolute Monocytes: 650 cells/uL (ref 200–950)
Basophils Absolute: 88 cells/uL (ref 0–200)
Basophils Relative: 1.2 %
Eosinophils Absolute: 212 cells/uL (ref 15–500)
Eosinophils Relative: 2.9 %
HCT: 45.7 % — ABNORMAL HIGH (ref 35.0–45.0)
Hemoglobin: 15.3 g/dL (ref 11.7–15.5)
Lymphs Abs: 2825 cells/uL (ref 850–3900)
MCH: 29.6 pg (ref 27.0–33.0)
MCHC: 33.5 g/dL (ref 32.0–36.0)
MCV: 88.4 fL (ref 80.0–100.0)
MPV: 10.8 fL (ref 7.5–12.5)
Monocytes Relative: 8.9 %
Neutro Abs: 3526 cells/uL (ref 1500–7800)
Neutrophils Relative %: 48.3 %
Platelets: 260 10*3/uL (ref 140–400)
RBC: 5.17 10*6/uL — ABNORMAL HIGH (ref 3.80–5.10)
RDW: 13 % (ref 11.0–15.0)
Total Lymphocyte: 38.7 %
WBC: 7.3 10*3/uL (ref 3.8–10.8)

## 2022-08-12 LAB — COMPLETE METABOLIC PANEL WITH GFR
AG Ratio: 1.4 (calc) (ref 1.0–2.5)
ALT: 25 U/L (ref 6–29)
AST: 20 U/L (ref 10–35)
Albumin: 4.3 g/dL (ref 3.6–5.1)
Alkaline phosphatase (APISO): 193 U/L — ABNORMAL HIGH (ref 37–153)
BUN: 13 mg/dL (ref 7–25)
CO2: 27 mmol/L (ref 20–32)
Calcium: 10.1 mg/dL (ref 8.6–10.4)
Chloride: 107 mmol/L (ref 98–110)
Creat: 0.8 mg/dL (ref 0.50–1.05)
Globulin: 3.1 g/dL (calc) (ref 1.9–3.7)
Glucose, Bld: 105 mg/dL — ABNORMAL HIGH (ref 65–99)
Potassium: 4.7 mmol/L (ref 3.5–5.3)
Sodium: 143 mmol/L (ref 135–146)
Total Bilirubin: 0.9 mg/dL (ref 0.2–1.2)
Total Protein: 7.4 g/dL (ref 6.1–8.1)
eGFR: 82 mL/min/{1.73_m2} (ref >=60)

## 2022-08-12 LAB — SEDIMENTATION RATE: Sed Rate: 31 mm/h — ABNORMAL HIGH (ref 0–30)

## 2022-08-13 ENCOUNTER — Ambulatory Visit (INDEPENDENT_AMBULATORY_CARE_PROVIDER_SITE_OTHER): Payer: Medicare HMO | Admitting: Student

## 2022-08-13 ENCOUNTER — Encounter: Payer: Self-pay | Admitting: Student

## 2022-08-13 ENCOUNTER — Other Ambulatory Visit: Payer: Self-pay

## 2022-08-13 VITALS — BP 116/77 | HR 95 | Temp 98.0°F | Ht 65.0 in | Wt 190.1 lb

## 2022-08-13 DIAGNOSIS — E785 Hyperlipidemia, unspecified: Secondary | ICD-10-CM

## 2022-08-13 DIAGNOSIS — Z0000 Encounter for general adult medical examination without abnormal findings: Secondary | ICD-10-CM

## 2022-08-13 DIAGNOSIS — Z23 Encounter for immunization: Secondary | ICD-10-CM

## 2022-08-13 DIAGNOSIS — R7303 Prediabetes: Secondary | ICD-10-CM | POA: Diagnosis not present

## 2022-08-13 DIAGNOSIS — M0579 Rheumatoid arthritis with rheumatoid factor of multiple sites without organ or systems involvement: Secondary | ICD-10-CM | POA: Diagnosis not present

## 2022-08-13 NOTE — Patient Instructions (Addendum)
Thank you so much for coming to the clinic today!   We are going to check your cholesterol as well as your A1c today, I will call you with the results. We are also going to give you the pneumonia vaccine here. I have also placed the order for the mammogram and DEXA scan.   If you have any questions please feel free to the call the clinic at anytime at [phone]. It was a pleasure seeing you!  Best, Dr. Thomasene Ripple

## 2022-08-14 LAB — HEMOGLOBIN A1C: Est. average glucose Bld gHb Est-mCnc: 157 mg/dL

## 2022-08-14 NOTE — Assessment & Plan Note (Signed)
Followed by Rheumatology, on Humira therapy, stable.

## 2022-08-14 NOTE — Assessment & Plan Note (Signed)
Last Lipid panel in July of 2023 LDL at goal, currently on Atorvastatin 10mg  daily. Will recheck today.

## 2022-08-14 NOTE — Addendum Note (Signed)
Addended by: Dickie La on: 08/14/2022 02:57 PM   Modules accepted: Level of Service

## 2022-08-14 NOTE — Progress Notes (Signed)
Internal Medicine Clinic Attending  Case discussed with Dr. Thomasene Ripple  At the time of the visit.  We reviewed the resident's history and exam and pertinent patient test results.  I agree with the assessment, diagnosis, and plan of care documented in the resident's note.  A1c now in diabetes range. Lifestyle modifications recommended by Dr. Thomasene Ripple. We will need to discuss eye exam, foot exam, urine ACR, and repeat A1c at next visit.

## 2022-08-14 NOTE — Assessment & Plan Note (Addendum)
Last A1c was 5.8% in 4/23. Pt states that her diet has not been the best within the last year given stress of dealing with RA. She also has not been able to exercise as much as she would like because of the RA.   Plan:  - Recheck A1c - Encouraged lifestyle modifications   ADDENDUM:  A1c elevated at 7.1, encouraged patient to keep up with diet and exercise daily. Have also reached out to front desk to schedule follow up appointment in three months.

## 2022-08-14 NOTE — Assessment & Plan Note (Signed)
PCV Vaccine given today, Hep C screening done today, DEXA scan, and mammogram both ordered.

## 2022-08-14 NOTE — Progress Notes (Addendum)
CC: Routine check up  HPI:  [name] is a 67 y.o. female living with a history stated below and presents today for routine checkup. Please see problem based assessment and plan for additional details.  Past Medical History:  Diagnosis Date   Absence of both cervix and uterus, acquired 12/14/2011   Acalculous cholecystitis 10/23/2017   Acquired deformity of right hand 03/13/2021   Acute left-sided low back pain without sciatica 03/13/2019   Anxiety    Anxiety 01/26/2019   Arthritis    Benign neoplasm of colon 12/14/2011   Fatigue 06/28/2020   Fibromyalgia    GERD (gastroesophageal reflux disease)    Healthcare maintenance 12/06/2017   High risk medication use 03/13/2021   Lymphadenopathy 03/13/2021   Migraine    Neck pain 03/13/2021   Orthopnea 08/29/2020   Overweight 03/13/2021   Polyarthralgia 01/26/2019   Screening for colon cancer 06/28/2020   Screening mammogram for breast cancer 06/28/2020   Shortness of breath at rest 08/29/2020   Snoring 08/29/2020    Current Outpatient Medications on File Prior to Visit  Medication Sig Dispense Refill   Adalimumab (HUMIRA, 2 PEN,) 40 MG/0.4ML PNKT INJECT 40 MG (0.4 ML) UNDER THE SKIN EVERY 14 DAYS 6 each 1   amantadine (SYMMETREL) 100 MG capsule TAKE 1 CAPSULE BY MOUTH ONCE DAILY FOR 3 DAYS THEN 2 ONCE DAILY FOR  FATIGUE  AND  ATTENTION 60 capsule 5   atorvastatin (LIPITOR) 10 MG tablet Take 1 tablet (10 mg total) by mouth daily. 30 tablet 11   cimetidine (TAGAMET) 200 MG tablet Take 200 mg by mouth daily as needed (Acid reflux).     cyclobenzaprine (FLEXERIL) 10 MG tablet Take 1 tablet (10 mg total) by mouth 2 (two) times daily as needed for muscle spasms. 180 tablet 1   Multiple Vitamins-Minerals (MULTIVITAMIN WOMEN 50+) TABS Take 1 tablet by mouth daily.     promethazine (PHENERGAN) 12.5 MG tablet TAKE 1 TABLET BY MOUTH EVERY 6 HOURS AS NEEDED FOR NAUSEA FOR VOMITING 90 tablet 5   traMADol (ULTRAM) 50 MG tablet TAKE 1 TABLET BY  MOUTH EVERY 6 HOURS AS NEEDED 120 tablet 5   No current facility-administered medications on file prior to visit.    Family History  Problem Relation Age of Onset   Cancer - Lung Mother    Brain cancer Mother    Colon polyps Father    Prostate cancer Father    Diverticulitis Father    Heart attack Father    Cancer Maternal Grandmother    Cancer Maternal Grandfather    Cancer Paternal Grandfather     Social History   Socioeconomic History   Marital status: Widowed    Spouse name: Not on file   Number of children: Not on file   Years of education: Not on file   Highest education level: Not on file  Occupational History   Not on file  Tobacco Use   Smoking status: Former    Packs/day: 0.50    Years: 40.00    Additional pack years: 0.00    Total pack years: 20.00    Types: Cigarettes    Quit date: 02/14/2021    Years since quitting: 1.4    Passive exposure: Past   Smokeless tobacco: Never  Vaping Use   Vaping Use: Never used  Substance and Sexual Activity   Alcohol use: Yes    Comment: 1-3 drinks annually   Drug use: Never   Sexual activity: Not on  file  Other Topics Concern   Not on file  Social History Narrative   Lives with youngest daughter and granddaughter   Right handed   Caffeine: 1 cup of coffee and 1 soda daily   Social Determinants of Health   Financial Resource Strain: Not on file  Food Insecurity: Not on file  Transportation Needs: Not on file  Physical Activity: Not on file  Stress: Not on file  Social Connections: Not on file  Intimate Partner Violence: Not on file    Review of Systems: ROS negative except for what is noted on the assessment and plan.  Vitals:   08/13/22 1445  BP: 116/77  Pulse: 95  Temp: 98 F (36.7 C)  TempSrc: Oral  SpO2: 97%  Weight: 190 lb 1.6 oz (86.2 kg)  Height: 5\' 5"  (1.651 m)    Physical Exam: Constitutional: well-appearing female  in no acute distress HENT: normocephalic atraumatic, mucous  membranes moist Eyes: conjunctiva non-erythematous Neck: supple Cardiovascular: regular rate and rhythm, no m/r/g Pulmonary/Chest: normal work of breathing on room air, lungs clear to auscultation bilaterally Abdominal: soft, non-tender, non-distended MSK: normal bulk and tone Neurological: alert & oriented x 3, 5/5 strength in bilateral upper and lower extremities, normal gait Skin: warm and dry Psych: normal mood and affect  Assessment & Plan:   Rheumatoid arthritis involving multiple sites with positive rheumatoid factor (HCC) Followed by Rheumatology, on Humira therapy, stable.   Prediabetes Last A1c was 5.8% in 4/23. Pt states that her diet has not been the best within the last year given stress of dealing with RA. She also has not been able to exercise as much as she would like because of the RA.   Plan:  - Recheck A1c - Encouraged lifestyle modifications   ADDENDUM:  A1c elevated at 7.1, encouraged patient to keep up with diet and exercise daily. Have also reached out to front desk to schedule follow up appointment in three months.   Hyperlipidemia Last Lipid panel in July of 2023 LDL at goal, currently on Atorvastatin 10mg  daily. Will recheck today.   Healthcare maintenance PCV Vaccine given today, Hep C screening done today, DEXA scan, and mammogram both ordered.   Patient discussed with Dr. Lennon Alstrom Chandon Lazcano, M.D. [facility], PGY-1 Pager: [phone] Date 08/14/2022 Time 2:45 PM

## 2022-08-15 LAB — LIPID PANEL
Chol/HDL Ratio: 2.6 ratio (ref 0.0–4.4)
Cholesterol, Total: 153 mg/dL (ref 100–199)
HDL: 60 mg/dL (ref >39)
LDL Chol Calc (NIH): 69 mg/dL (ref 0–99)
Triglycerides: 138 mg/dL (ref 0–149)
VLDL Cholesterol Cal: 24 mg/dL (ref 5–40)

## 2022-08-15 LAB — HEMOGLOBIN A1C: Hgb A1c MFr Bld: 7.1 % — ABNORMAL HIGH (ref 4.8–5.6)

## 2022-08-15 LAB — HEPATITIS C ANTIBODY: Hep C Virus Ab: NONREACTIVE

## 2022-08-24 ENCOUNTER — Other Ambulatory Visit: Payer: Self-pay | Admitting: *Deleted

## 2022-08-24 DIAGNOSIS — M0579 Rheumatoid arthritis with rheumatoid factor of multiple sites without organ or systems involvement: Secondary | ICD-10-CM

## 2022-08-24 DIAGNOSIS — Z79899 Other long term (current) drug therapy: Secondary | ICD-10-CM

## 2022-08-24 MED ORDER — HUMIRA (2 PEN) 40 MG/0.4ML ~~LOC~~ AJKT
AUTO-INJECTOR | SUBCUTANEOUS | 0 refills | Status: DC
Start: 2022-08-24 — End: 2022-11-11

## 2022-08-24 NOTE — Telephone Encounter (Signed)
Last Fill: 04/20/2022  Labs: 08/11/2022 Glucose 105, Alkaline Phosphatase 193, Sed rate 31, RBC 5.17, HCT 45.7,   TB Gold: 02/03/2022  NEGATIVE   Next Visit: 11/11/2022  Last Visit: 08/11/2022  [id number] arthritis involving multiple sites with positive rheumatoid factor   Current Dose per office note 08/11/2022: Humira 40 mg subcu q. 14 days   Okay to refill Humira?

## 2022-08-24 NOTE — Progress Notes (Signed)
Sed rate is slightly increased at 31. Blood count Is normal. Her metabolic panel continues to show elevated alkaline phosphatase at 193 but this is in the same range as before. No change in medication needed.

## 2022-09-01 ENCOUNTER — Ambulatory Visit (HOSPITAL_BASED_OUTPATIENT_CLINIC_OR_DEPARTMENT_OTHER): Admission: RE | Admit: 2022-09-01 | Payer: Medicare HMO | Source: Ambulatory Visit | Admitting: Radiology

## 2022-09-01 ENCOUNTER — Ambulatory Visit (HOSPITAL_BASED_OUTPATIENT_CLINIC_OR_DEPARTMENT_OTHER)
Admission: RE | Admit: 2022-09-01 | Discharge: 2022-09-01 | Disposition: A | Discharge status: Home / Self Care | Payer: Medicare HMO | Source: Ambulatory Visit | Attending: Internal Medicine | Admitting: Internal Medicine

## 2022-09-01 DIAGNOSIS — Z0000 Encounter for general adult medical examination without abnormal findings: Secondary | ICD-10-CM

## 2022-09-01 DIAGNOSIS — Z1231 Encounter for screening mammogram for malignant neoplasm of breast: Secondary | ICD-10-CM | POA: Insufficient documentation

## 2022-09-01 DIAGNOSIS — Z780 Asymptomatic menopausal state: Secondary | ICD-10-CM | POA: Insufficient documentation

## 2022-10-16 ENCOUNTER — Encounter: Payer: Self-pay | Admitting: Physical Medicine and Rehabilitation

## 2022-10-16 ENCOUNTER — Encounter: Payer: Medicare HMO | Attending: Physical Medicine and Rehabilitation | Admitting: Physical Medicine and Rehabilitation

## 2022-10-16 VITALS — BP 123/72 | HR 103 | Ht 65.0 in | Wt 182.0 lb

## 2022-10-16 DIAGNOSIS — M797 Fibromyalgia: Secondary | ICD-10-CM | POA: Diagnosis not present

## 2022-10-16 DIAGNOSIS — M545 Low back pain, unspecified: Secondary | ICD-10-CM

## 2022-10-16 DIAGNOSIS — M5416 Radiculopathy, lumbar region: Secondary | ICD-10-CM | POA: Insufficient documentation

## 2022-10-16 DIAGNOSIS — M0579 Rheumatoid arthritis with rheumatoid factor of multiple sites without organ or systems involvement: Secondary | ICD-10-CM | POA: Diagnosis not present

## 2022-10-16 DIAGNOSIS — G8929 Other chronic pain: Secondary | ICD-10-CM | POA: Diagnosis not present

## 2022-10-16 DIAGNOSIS — R5382 Chronic fatigue, unspecified: Secondary | ICD-10-CM

## 2022-10-16 MED ORDER — TRAMADOL HCL 50 MG PO TABS: 50.0000 mg | ORAL_TABLET | Freq: Four times a day (QID) | ORAL | 0 refills | Status: DC | PRN

## 2022-10-16 MED ORDER — AMANTADINE HCL 100 MG PO CAPS: 100.0000 mg | ORAL_CAPSULE | Freq: Every day | ORAL | 1 refills | Status: DC

## 2022-10-16 NOTE — Patient Instructions (Signed)
Pt is a 67 yr old female with hx of RA- hands, elbows, shoulders and neck-  DJD of "entire back"; borderline diabetes; GERD; no HTN. Also has scoliosis 15-20 degrees thoracolumbar and lumbar radiculopathy based on xray results. Also has Fibromyalgia- New Dx of DM2-  Here for f/u on RA and FMS.     Has lost weight and doing low sugar diet and lost 10 lbs. Con't regimen  2. Con't All meds- Amatadine, the Tramadol doesn't need refills, however was rewritten 6 months ago, so will need new refills for both. Sent in  3. Doesn'st need refill yet of Phenergan   4. Con't Vit D- 2000 units daily.   5. F/U in 6 months- for f/u on tramadol since doing so well.

## 2022-10-16 NOTE — Progress Notes (Signed)
Subjective:    Patient ID: Heidi Chapman, female    DOB: 06-30-1955, 67 y.o.   MRN: [id number]  HPI  Pt is a 67 yr old female with hx of RA- hands, elbows, shoulders and neck-  DJD of "entire back"; borderline diabetes; GERD; no HTN. Also has scoliosis 15-20 degrees thoracolumbar and lumbar radiculopathy based on xray results. Also has Fibromyalgia- Dx'd with new DM2 Here for f/u on RA and FMS.     Doing well-  Was dx'd with early DM2- and eating low sugar iet and feels so much better since cut out of diet.    Hurts a little- but nowhere the level is was.   Taking tramadol 0-1x/day but likely to get worse when gets cold again.   Taking Amantadine 1x/day and doing well.  And Vit D- been huge helps with pain.   Had some nausea, but thinks withdrawal from sugar.   Has used exercise ball, but getting out and walking and mowing lawn.  Walks late in day Daughter going to walk with pt as well.        Pain Inventory Average Pain 3 Pain Right Now 3 My pain is constant, dull, and aching  In the last 24 hours, has pain interfered with the following? General activity 5 Relation with others 3 Enjoyment of life 3 What TIME of day is your pain at its worst? evening Sleep (in general) NA  Pain is worse with: walking, bending, sitting, standing, and some activites Pain improves with: heat/ice, pacing activities, and medication Relief from Meds: 8  Family History  Problem Relation Age of Onset   Cancer - Lung Mother    Brain cancer Mother    Colon polyps Father    Prostate cancer Father    Diverticulitis Father    Heart attack Father    Cancer Maternal Grandmother    Cancer Maternal Grandfather    Cancer Paternal Grandfather    Social History   Socioeconomic History   Marital status: Widowed    Spouse name: Not on file   Number of children: Not on file   Years of education: Not on file   Highest education level: Not on file  Occupational History   Not on file   Tobacco Use   Smoking status: Former    Current packs/day: 0.00    Average packs/day: 0.5 packs/day for 40.0 years (20.0 ttl pk-yrs)    Types: Cigarettes    Start date: 02/14/1981    Quit date: 02/14/2021    Years since quitting: 1.6    Passive exposure: Past   Smokeless tobacco: Never  Vaping Use   Vaping status: Never Used  Substance and Sexual Activity   Alcohol use: Yes    Comment: 1-3 drinks annually   Drug use: Never   Sexual activity: Not on file  Other Topics Concern   Not on file  Social History Narrative   Lives with youngest daughter and granddaughter   Right handed   Caffeine: 1 cup of coffee and 1 soda daily   Social Determinants of Health   Financial Resource Strain: Not on file  Food Insecurity: Not on file  Transportation Needs: Not on file  Physical Activity: Not on file  Stress: Not on file  Social Connections: Not on file   Past Surgical History:  Procedure Laterality Date   CESAREAN SECTION     CHOLECYSTECTOMY N/A 10/24/2017   Procedure: LAPAROSCOPIC CHOLECYSTECTOMY WITH INTRAOPERATIVE CHOLANGIOGRAM;  Surgeon: Harriette Bouillon, MD;  Location: [facility] OR;  Service: General;  Laterality: N/A;   GALLBLADDER SURGERY     TUBAL LIGATION     Past Surgical History:  Procedure Laterality Date   CESAREAN SECTION     CHOLECYSTECTOMY N/A 10/24/2017   Procedure: LAPAROSCOPIC CHOLECYSTECTOMY WITH INTRAOPERATIVE CHOLANGIOGRAM;  Surgeon: Harriette Bouillon, MD;  Location: [facility] OR;  Service: General;  Laterality: N/A;   GALLBLADDER SURGERY     TUBAL LIGATION     Past Medical History:  Diagnosis Date   Absence of both cervix and uterus, acquired 12/14/2011   Acalculous cholecystitis 10/23/2017   Acquired deformity of right hand 03/13/2021   Acute left-sided low back pain without sciatica 03/13/2019   Anxiety    Anxiety 01/26/2019   Arthritis    Benign neoplasm of colon 12/14/2011   Fatigue 06/28/2020   Fibromyalgia    GERD (gastroesophageal reflux disease)     Healthcare maintenance 12/06/2017   High risk medication use 03/13/2021   Lymphadenopathy 03/13/2021   Migraine    Neck pain 03/13/2021   Orthopnea 08/29/2020   Overweight 03/13/2021   Polyarthralgia 01/26/2019   Screening for colon cancer 06/28/2020   Screening mammogram for breast cancer 06/28/2020   Shortness of breath at rest 08/29/2020   Snoring 08/29/2020   BP 123/72   Pulse (!) 103   Ht 5\' 5"  (1.651 m)   Wt 182 lb (82.6 kg)   SpO2 91%   BMI 30.29 kg/m   Opioid Risk Score:   Fall Risk Score:  `1  Depression screen [facility] 2/9     10/16/2022   11:08 AM 08/13/2022    2:43 PM 07/17/2022   10:11 AM 04/17/2022    1:36 PM 02/23/2022   10:40 AM 09/15/2021    9:27 AM 08/26/2021    2:37 PM  Depression screen PHQ 2/9  Decreased Interest 0 0 0 0  0 0  Down, Depressed, Hopeless 0 0 0 0 0 0 0  PHQ - 2 Score 0 0 0 0 0 0 0  Altered sleeping  0   3    Tired, decreased energy  3   3    Change in appetite  0   0    Feeling bad or failure about yourself   0   0    Trouble concentrating  3   3    Moving slowly or fidgety/restless  3   3    Suicidal thoughts  0   0    PHQ-9 Score  9   12    Difficult doing work/chores  Not difficult at all   Somewhat difficult       Review of Systems  Constitutional: Negative.   HENT: Negative.    Eyes: Negative.   Respiratory: Negative.    Cardiovascular: Negative.   Gastrointestinal: Negative.   Endocrine: Negative.   Genitourinary: Negative.   Musculoskeletal:  Positive for back pain.  Skin: Negative.   Allergic/Immunologic: Negative.   Neurological: Negative.   Hematological: Negative.   Psychiatric/Behavioral: Negative.    All other systems reviewed and are negative.      Objective:   Physical Exam        Assessment & Plan:   Pt is a 67 yr old female with hx of RA- hands, elbows, shoulders and neck-  DJD of "entire back"; borderline diabetes; GERD; no HTN. Also has scoliosis 15-20 degrees thoracolumbar and lumbar radiculopathy based on  xray results. Also has Fibromyalgia- New Dx of DM2-  Here for f/u  on RA and FMS.     Has lost weight and doing low sugar diet and lost 10 lbs. Con't regimen  2. Con't All meds- Amatadine, the Tramadol doesn't need refills, however was rewritten 6 months ago, so will need new refills for both. Sent in  3. Doesn'st need refill yet of Phenergan   4. Con't Vit D- 2000 units daily.   5. F/U in 6 months- for f/u on tramadol since doing so well.    I spent a total of 20   minutes on total care today- >50% coordination of care- due to discussion of low sugar diet and new dm diagnosis; and refill of meds.

## 2022-10-28 NOTE — Progress Notes (Signed)
Office Visit Note  Patient: Heidi Chapman             Date of Birth: 23-Sep-1955           MRN: [id number]             PCP: Champ Mungo, DO Referring: Champ Mungo, DO Visit Date: 11/11/2022   Subjective:  Follow-up   History of Present Illness: Heidi Chapman is a 67 y.o. female here for follow up for seropositive RA on Humira 40 mg subcu q. 14 days.  No significant flareup since her last visit.  She has noticed some increase in joint pain and stiffness in her hands mostly in her shoulders for the past week associates this with the change in weather.  Otherwise feels Humira is working pretty well notices a slight increase in morning stiffness and possibly the swelling in finger joints for a few days between doses.  Previous HPI 08/11/2022 Heidi Chapman is a 67 y.o. female here for follow up for seropositive RA on Humira 40 mg subcu q. 14 days.  She had another interruption in treatment associated with change in insurance status so just restarted this in the past month and has taken 2 doses so far.  The interruption of about 6 weeks duration and Humira treatment she experienced noticeable increase in hand pain and stiffness and swelling especially the several nodules on the fingers both right and left hand.  Bilateral shoulder pain also improved after resuming Humira.  Low back and hip pain has been a persistent problem so far not improved as much with resuming Humira.  This varies a lot from day-to-day sometimes more in the middle back and sometimes very low more around the low back and pelvis.  Hip pain is usually worse after driving or otherwise sitting still for a long duration.  Not having much complaint about her knees or legs she associates this more with fibromyalgia pain which is more stress triggered and has not been a recent flareup.   Previous HPI 05/07/2022 Heidi Chapman is a 67 y.o. female here for follow up for seropositive RA on Humira 40 mg subcu q. 14 days.  Since her  last visit she has been on the medicine pretty consistently did have interruptus with some type of upper respiratory illness around 2 weeks ago.  She thinks this was similar to influenza but less severe.  Did not require antibiotics.  While taking the medicine without interruption sees a good benefit notices joint pain and stiffness worsening for about 3 to 4 days prior to each injection being due.  She is using a heat pack on her hands overnight also helps with the pain and stiffness.  Currently her back pain is the most limiting symptom she is following up with Dr. Alice Reichert for this on treatment with Flexeril and tramadol.  She had updated lumbar spine x-ray showing radiographic progression since 2019.  Currently working on a home exercise program for core strengthening during the past 2 weeks has not seen a significant improvement so far.   Previous HPI 02/03/22 Heidi Chapman is a 67 y.o. female here for follow up for seropositive RA on Humira 40 mg [place] q14 days. Since resuming the Humira she notices improvement in her hand stiffness and swelling although still has some pain. Particularly nodules on the back of her joints hurt a lot with any minor bump or pressure.    Previous HPI 11/06/21 Heidi Chapman is a 67  y.o. female here for follow up for seropositive RA. She was taking Humira 40 mg [place] q14days with pretty stable symptoms but has missed the last 2 doses due to change in insurance and needing medicine pre-approval again. She has increased joint pain and stiffness especially in hands and wrists.   Previous HPI 08/06/2021 Heidi Chapman is a 67 y.o. female here for follow up for seropositive RA on Humira 40 mg [place] q14days started in March. She has generalized pain and fatigue related to fibromyalgia syndrome and DJD of her back, followed by Dr. Berline Chough currently on duloxetine and PRN tramadol. Symptoms are doing somewhat better. She still feels daily stiffness but is not seeing much swelling and  pain is decreased in elbow and hands and can tightly close fists. She has had several URI type symptoms intermittently feels like she gets sick when her granddaughter brings germs home from school, no antibiotics treatment or pneumonia.   Previous HPI 06/04/21 Heidi Chapman is a 67 y.o. female here for follow up for seropositive RA on Humira 40 mg [place] q14days started last month. Previous MTX treatment was not resumed due to abnormal baseline LFTs. She felt improvement after about 3 days on prednisone taper. Inflammation remains better but came back somewhat, but no large swollen nodules on the neck recently. Duloxetine is helping her chronic pain overall but still hurts with any activity more than an hour. She followed up with her neurology for the sleep apnea and fatigue still very fatigued limiting her activity.   Previous HPI 03/13/21 Heidi Chapman is a 67 y.o. female here for seropositive rheumatoid arthritis which has been treated with methotrexate 17.5 mg [place] weekly and simponi aria infusion and taking folic acid 3 mg daily. In addition to RA she suffers pain with OA including lumbar spine DJD and fibromyalgia pain treated with naproxen 500 mg PRN and flexeril.  She reports joint pain started since many years ago particularly with progressive pain and stiffness and then lateral deviation worsening in MCP joints of both hands.  She also had chronic osteoarthritis particularly in her back with sciatica and back pain at multiple levels.  She finally went for evaluation of the hand pain and deformities since about 3 years ago was diagnosed with seropositive rheumatoid arthritis.  Initial treatments tried include leflunomide which was not tolerated and Enbrel which did not have clinical response.  Oral methotrexate was not tolerable she had some degree of nausea with the subcutaneous methotrexate as well but not as bad.  While on treatment she felt disease control was very good mostly just had the chronic  OA type symptoms not much swelling and no progression of deformities. She has been a patient with Dr. Nickola Major needing to transfer care due to change in insurance status.  She is now off all of her medications since about a month ago and is noticing worsening joint pains again.  Particularly since 2 weeks ago she has much worse neck pain is developed lymph node swelling on the back of her skull and neck.  She went to the emergency department for this were no specific problems were found liver function test showed a mild elevation she was prescribed Keflex empirically for possible infection has not noticed any difference since taking this medicine. Besides joint pain and lymph node swelling she feels extremely fatigued overall.   DMARD Hx Enbrel primary nonresponder Simponi aria TOC Methotrexate [place] TOC Methotrexate PO GI intolerance Leflunomide not tolerated   Labs reviewed 02/2021  ESR 26 CBC wnl CMP AST 43 ALT 56 Alk phos 149   03/2019 TB neg HBV neg HCV neg   01/2019 RA 109 CCP >250   10/2017 HIV neg   Review of Systems  Constitutional:  Positive for fatigue.  HENT:  Positive for mouth dryness. Negative for mouth sores.   Eyes:  Positive for dryness.  Respiratory:  Positive for shortness of breath.   Cardiovascular:  Negative for chest pain and palpitations.  Gastrointestinal:  Negative for blood in stool, constipation and diarrhea.  Endocrine: Negative for increased urination.  Genitourinary:  Negative for involuntary urination.  Musculoskeletal:  Positive for joint pain, gait problem, joint pain, joint swelling, myalgias, muscle weakness and myalgias. Negative for morning stiffness and muscle tenderness.  Skin:  Positive for sensitivity to sunlight. Negative for color change, rash and hair loss.  Allergic/Immunologic: Negative for susceptible to infections.  Neurological:  Positive for dizziness. Negative for headaches.  Hematological:  Negative for swollen glands.   Psychiatric/Behavioral:  Negative for depressed mood and sleep disturbance. The patient is nervous/anxious.     PMFS History:  Patient Active Problem List   Diagnosis Date Noted   Need for immunization against influenza 02/23/2022   Normocytic anemia 08/26/2021   Fibromyalgia 06/04/2021   Depression with anxiety 05/20/2021   Chronic bilateral back pain 03/19/2021   Other idiopathic scoliosis, thoracolumbar region 03/19/2021   Lumbar radiculopathy 03/19/2021   High risk medication use 03/13/2021   Body mass index (BMI) 30.0-30.9, adult 03/13/2021   Osteoarthritis 11/18/2020   Obstructive sleep apnea 08/29/2020   Chronic fatigue 06/28/2020   Hyperlipidemia 06/28/2020   Prediabetes 06/28/2020   History of tobacco abuse 06/28/2020   Rheumatoid arthritis involving multiple sites with positive rheumatoid factor (HCC) 03/13/2019   Healthcare maintenance 12/06/2017    Past Medical History:  Diagnosis Date   Absence of both cervix and uterus, acquired 12/14/2011   Acalculous cholecystitis 10/23/2017   Acquired deformity of right hand 03/13/2021   Acute left-sided low back pain without sciatica 03/13/2019   Anxiety    Anxiety 01/26/2019   Arthritis    Benign neoplasm of colon 12/14/2011   Diabetes (HCC)    Fatigue 06/28/2020   Fibromyalgia    GERD (gastroesophageal reflux disease)    Healthcare maintenance 12/06/2017   High risk medication use 03/13/2021   Lymphadenopathy 03/13/2021   Migraine    Neck pain 03/13/2021   Orthopnea 08/29/2020   Overweight 03/13/2021   Polyarthralgia 01/26/2019   Screening for colon cancer 06/28/2020   Screening mammogram for breast cancer 06/28/2020   Shortness of breath at rest 08/29/2020   Snoring 08/29/2020    Family History  Problem Relation Age of Onset   Cancer - Lung Mother    Brain cancer Mother    Colon polyps Father    Prostate cancer Father    Diverticulitis Father    Heart attack Father    Cancer Maternal Grandmother     Cancer Maternal Grandfather    Cancer Paternal Grandfather    Past Surgical History:  Procedure Laterality Date   CESAREAN SECTION     CHOLECYSTECTOMY N/A 10/24/2017   Procedure: LAPAROSCOPIC CHOLECYSTECTOMY WITH INTRAOPERATIVE CHOLANGIOGRAM;  Surgeon: Harriette Bouillon, MD;  Location: [facility] OR;  Service: General;  Laterality: N/A;   GALLBLADDER SURGERY     TUBAL LIGATION     Social History   Social History Narrative   Lives with youngest daughter and granddaughter   Right handed   Caffeine: 1 cup of coffee  and 1 soda daily   Immunization History  Administered Date(s) Administered   Influenza,inj,Quad PF,6+ Mos 01/26/2019, 11/18/2020, 02/23/2022   PFIZER(Purple Top)SARS-COV-2 Vaccination 05/13/2019, 06/07/2019, 10/30/2019   PNEUMOCOCCAL CONJUGATE-20 08/13/2022     Objective: Vital Signs: BP 118/76 (BP Location: Right Arm, Patient Position: Sitting, Cuff Size: Normal)   Pulse 91   Resp 14   Ht 5\' 5"  (1.651 m)   Wt 184 lb (83.5 kg)   BMI 30.62 kg/m    Physical Exam Constitutional:      Appearance: She is obese.  Eyes:     Conjunctiva/sclera: Conjunctivae normal.  Cardiovascular:     Rate and Rhythm: Normal rate and regular rhythm.  Pulmonary:     Effort: Pulmonary effort is normal.     Breath sounds: Normal breath sounds.  Musculoskeletal:     Right lower leg: No edema.     Left lower leg: No edema.  Lymphadenopathy:     Cervical: No cervical adenopathy.  Skin:    General: Skin is warm and dry.     Findings: No rash.  Neurological:     Mental Status: She is alert.  Psychiatric:        Mood and Affect: Mood normal.      Musculoskeletal Exam:  Shoulders tenderness to pressure on all sides of joint on both sides, pain with overhead abduction and reaching behind back some pain radiation up to the shoulder blades and down the mid back, no palpable swelling Elbows full ROM no tenderness or swelling Wrists full ROM no tenderness or swelling Fingers full ROM, reducible  lateral deviation, nontender cyst at second MCP joint on both hands and on thumb, no palpable effusion Knees full ROM no tenderness or swelling, right worse than left patellofemoral crepitus Ankles full ROM no tenderness or swelling   Investigation: No additional findings.  Imaging: No results found.  Recent Labs: Lab Results  Component Value Date   WBC 7.3 08/11/2022   HGB 15.3 08/11/2022   PLT 260 08/11/2022   NA 143 08/11/2022   K 4.7 08/11/2022   CL 107 08/11/2022   CO2 27 08/11/2022   GLUCOSE 105 (H) 08/11/2022   BUN 13 08/11/2022   CREATININE 0.80 08/11/2022   BILITOT 0.9 08/11/2022   ALKPHOS 156 (H) 08/17/2021   AST 20 08/11/2022   ALT 25 08/11/2022   PROT 7.4 08/11/2022   ALBUMIN 3.1 (L) 08/17/2021   CALCIUM 10.1 08/11/2022   GFRAA 84 01/26/2019   QFTBGOLDPLUS NEGATIVE 02/03/2022    Speciality Comments: Leflunomide - intolerance; Enbrel - inadequate response; oral MTX - intolerable side effects; SImponi Aria infusions stopped in Jan 2023 and switched to Humira for convenience of self-injection; MTX stopped in Feb 2023 due to elevated LFTs Humira started 04/17/21  Procedures:  No procedures performed Allergies: Azithromycin, Leflunomide, Peanut butter flavor, and Latex   Assessment / Plan:     Visit Diagnoses: Rheumatoid arthritis involving multiple sites with positive rheumatoid factor (HCC)  Joint inflammation appears well-controlled does have a few cyst looks more like extensor tendon ganglion cyst less likely digital mucinous cyst due to the distribution.  Checking sedimentation rate for disease activity monitoring.  Recommended to keep close track of change in symptoms the next few months since it usually worse in the cold.  Continue Humira 40 mg subcu q. 14 days.  High risk medication use - Humira 40 mg subcu q. 14 days  Checking CBC and CMP for medication monitoring on continued long-term use of Humira.  No serious interval infections.  Most recent  QuantiFERON was from December last year negative.  Trigger finger of left thumb  Osteoarthritis of spine with radiculopathy, thoracolumbar region - has been getting very good benefit working with Dr. Alice Reichert on this. On low-dose tramadol and Flexeril.  Fibromyalgia  Continue follow-up doing well with Dr. Alice Reichert Flexeril 10 mg twice a day doing well with no recent adjustment needed.  Orders: Orders Placed This Encounter  Procedures   Sedimentation rate   CBC with Differential/Platelet   COMPLETE METABOLIC PANEL WITH GFR   No orders of the defined types were placed in this encounter.    Follow-Up Instructions: Return in about 3 months (around 02/10/2023) for RA on ADA f/u 3mos.   Fuller Plan, MD  Note - This record has been created using [employer].  Chart creation errors have been sought, but may not always  have been located. Such creation errors do not reflect on  the standard of medical care.

## 2022-11-11 ENCOUNTER — Encounter: Payer: Self-pay | Admitting: Internal Medicine

## 2022-11-11 ENCOUNTER — Ambulatory Visit: Payer: Medicare HMO | Attending: Internal Medicine | Admitting: Internal Medicine

## 2022-11-11 VITALS — BP 118/76 | HR 91 | Resp 14 | Ht 65.0 in | Wt 184.0 lb

## 2022-11-11 DIAGNOSIS — M0579 Rheumatoid arthritis with rheumatoid factor of multiple sites without organ or systems involvement: Secondary | ICD-10-CM

## 2022-11-11 DIAGNOSIS — Z79899 Other long term (current) drug therapy: Secondary | ICD-10-CM | POA: Diagnosis not present

## 2022-11-11 DIAGNOSIS — M4725 Other spondylosis with radiculopathy, thoracolumbar region: Secondary | ICD-10-CM

## 2022-11-11 DIAGNOSIS — M65312 Trigger thumb, left thumb: Secondary | ICD-10-CM

## 2022-11-11 DIAGNOSIS — M797 Fibromyalgia: Secondary | ICD-10-CM

## 2022-11-11 MED ORDER — HUMIRA (2 PEN) 40 MG/0.4ML ~~LOC~~ AJKT
AUTO-INJECTOR | SUBCUTANEOUS | 0 refills | Status: DC
Start: 2022-11-11 — End: 2022-11-11

## 2022-11-11 MED ORDER — HUMIRA (2 PEN) 40 MG/0.4ML ~~LOC~~ AJKT
AUTO-INJECTOR | SUBCUTANEOUS | 0 refills | Status: DC
Start: 2022-11-11 — End: 2022-11-12

## 2022-11-12 ENCOUNTER — Other Ambulatory Visit: Payer: Self-pay | Admitting: Pharmacist

## 2022-11-12 DIAGNOSIS — Z79899 Other long term (current) drug therapy: Secondary | ICD-10-CM

## 2022-11-12 DIAGNOSIS — M0579 Rheumatoid arthritis with rheumatoid factor of multiple sites without organ or systems involvement: Secondary | ICD-10-CM

## 2022-11-12 LAB — COMPLETE METABOLIC PANEL WITH GFR
AG Ratio: 1.4 (calc) (ref 1.0–2.5)
ALT: 32 U/L — ABNORMAL HIGH (ref 6–29)
AST: 26 U/L (ref 10–35)
Albumin: 4.2 g/dL (ref 3.6–5.1)
Alkaline phosphatase (APISO): 201 U/L — ABNORMAL HIGH (ref 37–153)
BUN: 15 mg/dL (ref 7–25)
CO2: 25 mmol/L (ref 20–32)
Calcium: 9.6 mg/dL (ref 8.6–10.4)
Chloride: 105 mmol/L (ref 98–110)
Creat: 0.75 mg/dL (ref 0.50–1.05)
Globulin: 2.9 g/dL (calc) (ref 1.9–3.7)
Glucose, Bld: 103 mg/dL — ABNORMAL HIGH (ref 65–99)
Potassium: 4.3 mmol/L (ref 3.5–5.3)
Sodium: 140 mmol/L (ref 135–146)
Total Bilirubin: 1.3 mg/dL — ABNORMAL HIGH (ref 0.2–1.2)
Total Protein: 7.1 g/dL (ref 6.1–8.1)
eGFR: 88 mL/min/{1.73_m2} (ref >=60)

## 2022-11-12 LAB — CBC WITH DIFFERENTIAL/PLATELET
Absolute Monocytes: 600 cells/uL (ref 200–950)
Basophils Absolute: 50 cells/uL (ref 0–200)
Basophils Relative: 0.9 %
Eosinophils Absolute: 242 cells/uL (ref 15–500)
Eosinophils Relative: 4.4 %
HCT: 45 % (ref 35.0–45.0)
Hemoglobin: 14.5 g/dL (ref 11.7–15.5)
Lymphs Abs: 1975 cells/uL (ref 850–3900)
MCH: 28.6 pg (ref 27.0–33.0)
MCHC: 32.2 g/dL (ref 32.0–36.0)
MCV: 88.8 fL (ref 80.0–100.0)
MPV: 10.7 fL (ref 7.5–12.5)
Monocytes Relative: 10.9 %
Neutro Abs: 2635 cells/uL (ref 1500–7800)
Neutrophils Relative %: 47.9 %
Platelets: 253 10*3/uL (ref 140–400)
RBC: 5.07 10*6/uL (ref 3.80–5.10)
RDW: 13.1 % (ref 11.0–15.0)
Total Lymphocyte: 35.9 %
WBC: 5.5 10*3/uL (ref 3.8–10.8)

## 2022-11-12 LAB — SEDIMENTATION RATE: Sed Rate: 34 mm/h — ABNORMAL HIGH (ref 0–30)

## 2022-11-12 MED ORDER — HUMIRA (2 PEN) 40 MG/0.4ML ~~LOC~~ AJKT
AUTO-INJECTOR | SUBCUTANEOUS | 0 refills | Status: DC
Start: 2022-11-12 — End: 2023-02-01

## 2022-11-28 ENCOUNTER — Other Ambulatory Visit: Payer: Self-pay | Admitting: Physical Medicine and Rehabilitation

## 2023-01-05 ENCOUNTER — Telehealth: Payer: Self-pay

## 2023-01-05 NOTE — Telephone Encounter (Signed)
PA Submitted for promethazine  Heidi Chapman (Key: [id number])

## 2023-01-21 ENCOUNTER — Telehealth: Payer: Self-pay

## 2023-01-21 NOTE — Telephone Encounter (Signed)
[employer]  I rep is requesting  a call back she stated that they faxed over on 11/12 a medical records but has not heard anything back ...   Name : Heidi Chapman    Tel : [phone]

## 2023-02-01 ENCOUNTER — Ambulatory Visit (INDEPENDENT_AMBULATORY_CARE_PROVIDER_SITE_OTHER): Payer: Medicare HMO

## 2023-02-01 ENCOUNTER — Ambulatory Visit (INDEPENDENT_AMBULATORY_CARE_PROVIDER_SITE_OTHER): Payer: Medicare HMO | Admitting: Internal Medicine

## 2023-02-01 ENCOUNTER — Other Ambulatory Visit: Payer: Self-pay

## 2023-02-01 ENCOUNTER — Encounter: Payer: Self-pay | Admitting: Internal Medicine

## 2023-02-01 VITALS — BP 123/81 | HR 99 | Temp 98.1°F | Ht 65.0 in | Wt 181.9 lb

## 2023-02-01 VITALS — BP 123/81 | HR 99 | Temp 98.1°F | Ht 65.0 in | Wt 181.4 lb

## 2023-02-01 DIAGNOSIS — R7303 Prediabetes: Secondary | ICD-10-CM

## 2023-02-01 DIAGNOSIS — Z0000 Encounter for general adult medical examination without abnormal findings: Secondary | ICD-10-CM

## 2023-02-01 DIAGNOSIS — Z79899 Other long term (current) drug therapy: Secondary | ICD-10-CM

## 2023-02-01 DIAGNOSIS — R42 Dizziness and giddiness: Secondary | ICD-10-CM | POA: Insufficient documentation

## 2023-02-01 DIAGNOSIS — E785 Hyperlipidemia, unspecified: Secondary | ICD-10-CM | POA: Diagnosis not present

## 2023-02-01 DIAGNOSIS — M0579 Rheumatoid arthritis with rheumatoid factor of multiple sites without organ or systems involvement: Secondary | ICD-10-CM

## 2023-02-01 LAB — POCT GLYCOSYLATED HEMOGLOBIN (HGB A1C): Hemoglobin A1C: 6.2 % — AB (ref 4.0–5.6)

## 2023-02-01 LAB — GLUCOSE, CAPILLARY: Glucose-Capillary: 123 mg/dL — ABNORMAL HIGH (ref 70–99)

## 2023-02-01 MED ORDER — HUMIRA (2 PEN) 40 MG/0.4ML ~~LOC~~ AJKT: AUTO-INJECTOR | SUBCUTANEOUS | 0 refills | Status: DC

## 2023-02-01 NOTE — Assessment & Plan Note (Addendum)
Describes exacerbation with some changes of her chronic vertigo. She has had trouble with vertigo specifically when she was younger in her teenage years and has had dizziness with stairs. In the last several years she hasn't lived in a home where the use of stairs was necessary but now that she does, she has noticed her symptoms more.   Prior episodes of vertigo had significant nausea, some vomiting, and room spinning. She had Epley maneuver performed which helped her symptoms. At this time she has some mild nausea but has not had vomiting.   She describes the sensation as feeling more like motion or sea sickness. She denies vision changes or trouble with her vision except during these episodes where her vision goes black for a split second and she falls. She has had 2 falls since November, one while taking stairs and the other when she turned her head to the side too quickly. She did not get hurt during these falls.   She is eating and drinking okay. Denies palpitations. No association of symptoms with increased heat. She does not lose consciousness during these episodes.  She does not have vision changes with head position or when looking around but does note feeling this dizzy feeling when looking to horizontal extremes or when returning her head from neck extension to neutral on exam. When her eyes are closed, her symptoms are exacerbated.   No carotid bruit. Neurological exam is normal. No obvious/new medications that would be likely to be causing this. Orthostatic VS are negative:   Sitting BP 124/81 HR 87 Laying BP 115/87 HR 78 Standing BP 123/77 HR 105  Favor BPPV over vascular etiology of patient's symptoms.  Plan: Will place referral for vestibular PT. Advised patient to use extreme caution when using stairs and head movements. Will plan for follow up in about 3 months to re-evaluate, or sooner if needed.

## 2023-02-01 NOTE — Assessment & Plan Note (Signed)
OP regimen is atorvastatin 10 mg daily which she is compliant with. Lipid panel last checked 07/2022 with LDL at goal. Plan: Continue atorvastatin 10 mg daily.

## 2023-02-01 NOTE — Progress Notes (Signed)
Subjective:   Heidi Chapman is a 67 y.o. female who presents for an Initial Medicare Annual Wellness Visit.  Visit Complete: In person   Cardiac Risk Factors include: advanced age (>41men, >81 women);obesity (BMI >30kg/m2);hypertension     Objective:    Today's Vitals   02/01/23 1018 02/01/23 1020  BP: 123/81   Pulse: 99   Temp: 98.1 F (36.7 C)   TempSrc: Oral   SpO2: 100%   Weight: 181 lb 6.4 oz (82.3 kg)   Height: 5\' 5"  (1.651 m)   PainSc: 2  2    Body mass index is 30.19 kg/m.     02/01/2023   10:24 AM 02/23/2022   10:40 AM 09/15/2021    9:27 AM 08/26/2021    2:38 PM 08/17/2021    7:45 PM 08/17/2021    2:08 PM 05/20/2021   10:53 AM  Advanced Directives  Does Patient Have a Medical Advance Directive? No No No No No No Yes  Type of Tax inspector;Living will  Does patient want to make changes to medical advance directive?       No - Patient declined  Copy of Healthcare Power of Attorney in Chart?       No - copy requested  Would patient like information on creating a medical advance directive? No - Patient declined No - Patient declined No - Patient declined No - Patient declined No - Patient declined No - Patient declined     Current Medications (verified) Outpatient Encounter Medications as of 02/01/2023  Medication Sig   adalimumab (HUMIRA, 2 PEN,) 40 MG/0.4ML pen INJECT 40 MG (0.4 ML) UNDER THE SKIN EVERY 14 DAYS   amantadine (SYMMETREL) 100 MG capsule Take 1 capsule (100 mg total) by mouth daily.   atorvastatin (LIPITOR) 10 MG tablet Take 1 tablet (10 mg total) by mouth daily.   Cholecalciferol (DIALYVITE VITAMIN D 5000 PO) Take by mouth.   cimetidine (TAGAMET) 200 MG tablet Take 200 mg by mouth daily as needed (Acid reflux).   cyclobenzaprine (FLEXERIL) 10 MG tablet Take 1 tablet (10 mg total) by mouth 2 (two) times daily as needed for muscle spasms.   Multiple Vitamins-Minerals (MULTIVITAMIN WOMEN 50+) TABS Take 1 tablet  by mouth daily.   promethazine (PHENERGAN) 12.5 MG tablet TAKE 1 TABLET BY MOUTH EVERY 6 HOURS AS NEEDED FOR NAUSEA FOR VOMITING   traMADol (ULTRAM) 50 MG tablet TAKE 1 TABLET BY MOUTH EVERY 6 HOURS AS NEEDED   traMADol (ULTRAM) 50 MG tablet TAKE 1 TABLET BY MOUTH EVERY 6 HOURS AS NEEDED   No facility-administered encounter medications on file as of 02/01/2023.    Allergies (verified) Azithromycin, Leflunomide, Peanut butter flavoring agent (non-screening), and Latex   History: Past Medical History:  Diagnosis Date   Absence of both cervix and uterus, acquired 12/14/2011   Acalculous cholecystitis 10/23/2017   Acquired deformity of right hand 03/13/2021   Acute left-sided low back pain without sciatica 03/13/2019   Anxiety    Anxiety 01/26/2019   Arthritis    Benign neoplasm of colon 12/14/2011   Diabetes (HCC)    Fatigue 06/28/2020   Fibromyalgia    GERD (gastroesophageal reflux disease)    Healthcare maintenance 12/06/2017   High risk medication use 03/13/2021   Lymphadenopathy 03/13/2021   Migraine    Neck pain 03/13/2021   Orthopnea 08/29/2020   Overweight 03/13/2021   Polyarthralgia 01/26/2019   Screening for colon cancer 06/28/2020  Screening mammogram for breast cancer 06/28/2020   Shortness of breath at rest 08/29/2020   Snoring 08/29/2020   Past Surgical History:  Procedure Laterality Date   CESAREAN SECTION     CHOLECYSTECTOMY N/A 10/24/2017   Procedure: LAPAROSCOPIC CHOLECYSTECTOMY WITH INTRAOPERATIVE CHOLANGIOGRAM;  Surgeon: Harriette Bouillon, MD;  Location: [facility] OR;  Service: General;  Laterality: N/A;   GALLBLADDER SURGERY     TUBAL LIGATION     Family History  Problem Relation Age of Onset   Cancer - Lung Mother    Brain cancer Mother    Colon polyps Father    Prostate cancer Father    Diverticulitis Father    Heart attack Father    Cancer Maternal Grandmother    Cancer Maternal Grandfather    Cancer Paternal Grandfather    Social History    Socioeconomic History   Marital status: Widowed    Spouse name: Not on file   Number of children: Not on file   Years of education: Not on file   Highest education level: Not on file  Occupational History   Not on file  Tobacco Use   Smoking status: Former    Current packs/day: 0.00    Average packs/day: 0.5 packs/day for 40.0 years (20.0 ttl pk-yrs)    Types: Cigarettes    Start date: 02/14/1981    Quit date: 02/14/2021    Years since quitting: 1.9    Passive exposure: Past   Smokeless tobacco: Never  Vaping Use   Vaping status: Never Used  Substance and Sexual Activity   Alcohol use: Yes    Comment: 1-3 drinks annually   Drug use: Never   Sexual activity: Not on file  Other Topics Concern   Not on file  Social History Narrative   Lives with youngest daughter and granddaughter   Right handed   Caffeine: 1 cup of coffee and 1 soda daily   Social Drivers of Health   Financial Resource Strain: Low Risk  (02/01/2023)   Overall Financial Resource Strain (CARDIA)    Difficulty of Paying Living Expenses: Not hard at all  Food Insecurity: No Food Insecurity (02/01/2023)   Hunger Vital Sign    Worried About Running Out of Food in the Last Year: Never true    Ran Out of Food in the Last Year: Never true  Transportation Needs: No Transportation Needs (02/01/2023)   PRAPARE - Administrator, Civil Service (Medical): No    Lack of Transportation (Non-Medical): No  Physical Activity: Insufficiently Active (02/01/2023)   Exercise Vital Sign    Days of Exercise per Week: 2 days    Minutes of Exercise per Session: 40 min  Stress: No Stress Concern Present (02/01/2023)   [employer] of Occupational Health - Occupational Stress Questionnaire    Feeling of Stress : Not at all  Social Connections: Moderately Isolated (02/01/2023)   Social Connection and Isolation Panel [NHANES]    Frequency of Communication with Friends and Family: More than three times a week     Frequency of Social Gatherings with Friends and Family: More than three times a week    Attends Religious Services: More than 4 times per year    Active Member of [employer] or Organizations: No    Attends Banker Meetings: Never    Marital Status: Widowed    Tobacco Counseling Counseling given: Not Answered   Clinical Intake:  Pre-visit preparation completed: Yes  Pain : 0-10 Pain Score: 2  Pain  Type: Chronic pain Pain Location: Other (Comment) Pain Descriptors / Indicators: Constant Pain Onset: More than a month ago     Nutritional Risks: None Diabetes: No  How often do you need to have someone help you when you read instructions, pamphlets, or other written materials from your doctor or pharmacy?: 1 - Never What is the last grade level you completed in school?: SOME  COLLEGE  Interpreter Needed?: No  Information entered by :: [facility] Tiearra Colwell   Activities of Daily Living    02/01/2023   10:21 AM 08/13/2022    2:43 PM  In your present state of health, do you have any difficulty performing the following activities:  Hearing? 0 0  Vision? 0 0  Difficulty concentrating or making decisions? 0 0  Walking or climbing stairs? 0 0  Dressing or bathing? 0 0  Doing errands, shopping? 0 0  Preparing Food and eating ? N   Using the Toilet? N   In the past six months, have you accidently leaked urine? N   Do you have problems with loss of bowel control? N   Managing your Medications? N   Managing your Finances? N   Housekeeping or managing your Housekeeping? N     Patient Care Team: Champ Mungo, DO as PCP - General  Indicate any recent Medical Services you may have received from other than [facility] providers in the past year (date may be approximate).     Assessment:   This is a routine wellness examination for [place].  Hearing/Vision screen No results found.   Goals Addressed   None   Depression Screen    10/16/2022   11:08 AM 08/13/2022    2:43 PM  07/17/2022   10:11 AM 04/17/2022    1:36 PM 02/23/2022   10:40 AM 09/15/2021    9:27 AM 08/26/2021    2:37 PM  PHQ 2/9 Scores  PHQ - 2 Score 0 0 0 0 0 0 0  PHQ- 9 Score  9   12      Fall Risk    02/01/2023   10:24 AM 02/01/2023   10:01 AM 10/16/2022   11:08 AM 08/13/2022    2:39 PM 07/17/2022   10:11 AM  Fall Risk   Falls in the past year? 1 1 0 0 0  Number falls in past yr: 1 1 0    Injury with Fall? 1 1     Risk for fall due to : Mental status change Mental status change  No Fall Risks   Follow up Falls prevention discussed;Falls evaluation completed Falls prevention discussed;Falls evaluation completed       MEDICARE RISK AT HOME: Medicare Risk at Home Any stairs in or around the home?: Yes If so, are there any without handrails?: No Home free of loose throw rugs in walkways, pet beds, electrical cords, etc?: Yes Adequate lighting in your home to reduce risk of falls?: Yes Life alert?: No Use of a cane, walker or w/c?: No Grab bars in the bathroom?: Yes Shower chair or bench in shower?: Yes Elevated toilet seat or a handicapped toilet?: Yes  TIMED UP AND GO:  Was the test performed? No    Cognitive Function:        02/01/2023   10:24 AM  6CIT Screen  What Year? 0 points  What month? 0 points  What time? 0 points  Count back from 20 0 points  Months in reverse 0 points  Repeat phrase 0 points  Total Score 0 points    Immunizations Immunization History  Administered Date(s) Administered   Influenza,inj,Quad PF,6+ Mos 01/26/2019, 11/18/2020, 02/23/2022   PFIZER(Purple Top)SARS-COV-2 Vaccination 05/13/2019, 06/07/2019, 10/30/2019   PNEUMOCOCCAL CONJUGATE-20 08/13/2022    TDAP status: Due, Education has been provided regarding the importance of this vaccine. Advised may receive this vaccine at local pharmacy or Health Dept. Aware to provide a copy of the vaccination record if obtained from local pharmacy or Health Dept. Verbalized acceptance and  understanding.  Flu Vaccine status: Up to date  Pneumococcal vaccine status: Up to date  Covid-19 vaccine status: Completed vaccines  Qualifies for Shingles Vaccine? Yes   Zostavax completed No   Shingrix Completed?: No.    Education has been provided regarding the importance of this vaccine. Patient has been advised to call insurance company to determine out of pocket expense if they have not yet received this vaccine. Advised may also receive vaccine at local pharmacy or Health Dept. Verbalized acceptance and understanding.  Screening Tests Health Maintenance  Topic Date Due   FOOT EXAM  Never done   OPHTHALMOLOGY EXAM  Never done   Diabetic kidney evaluation - Urine ACR  Never done   DTaP/Tdap/Td (1 - Tdap) Never done   Zoster Vaccines- Shingrix (1 of 2) Never done   INFLUENZA VACCINE  09/17/2022   COVID-19 Vaccine (4 - 2024-25 season) 10/18/2022   Lung Cancer Screening  04/10/2023   HEMOGLOBIN A1C  08/02/2023   Diabetic kidney evaluation - eGFR measurement  11/11/2023   Medicare Annual Wellness (AWV)  02/01/2024   MAMMOGRAM  08/31/2024   Colonoscopy  09/05/2025   Pneumonia Vaccine 45+ Years old  Completed   DEXA SCAN  Completed   Hepatitis C Screening  Completed   HIV Screening  Completed   HPV VACCINES  Aged Out    Health Maintenance  Health Maintenance Due  Topic Date Due   FOOT EXAM  Never done   OPHTHALMOLOGY EXAM  Never done   Diabetic kidney evaluation - Urine ACR  Never done   DTaP/Tdap/Td (1 - Tdap) Never done   Zoster Vaccines- Shingrix (1 of 2) Never done   INFLUENZA VACCINE  09/17/2022   COVID-19 Vaccine (4 - 2024-25 season) 10/18/2022    Colorectal cancer screening: Type of screening: Colonoscopy. Completed 09/05/2020. Repeat every 5 years  Mammogram status: Completed 09/01/2022. Repeat every2 year  Bone Density status: Completed 09/01/2022. Results reflect: Bone density results: OSTEOPENIA.   Lung Cancer Screening: (Low Dose CT Chest recommended if  Age 21-80 years, 20 pack-year currently smoking OR have quit w/in 15years.) does not qualify.   Lung Cancer Screening Referral: DEFERRED  TO PCP   Additional Screening:  Hepatitis C Screening: does qualify; Completed 08/15/22  Vision Screening: Recommended annual ophthalmology exams for early detection of glaucoma and other disorders of the eye. Is the patient up to date with their annual eye exam?  Yes  Who is the provider or what is the name of the office in which the patient attends annual eye exams? [facility] Burgess [facility]   If pt is not established with a provider, would they like to be referred to a provider to establish care? No .   Dental Screening: Recommended annual dental exams for proper oral hygiene  Diabetic Foot Exam: N/A  Community Resource Referral / Chronic Care Management: CRR required this visit?  No   CCM required this visit?  No     Plan:     I have personally  reviewed and noted the following in the patient's chart:   Medical and social history Use of alcohol, tobacco or illicit drugs  Current medications and supplements including opioid prescriptions. Patient is currently taking opioid prescriptions. Information provided to patient regarding non-opioid alternatives. Patient advised to discuss non-opioid treatment plan with their provider. Functional ability and status Nutritional status Physical activity Advanced directives List of other physicians Hospitalizations, surgeries, and ER visits in previous 12 months Vitals Screenings to include cognitive, depression, and falls Referrals and appointments  In addition, I have reviewed and discussed with patient certain preventive protocols, quality metrics, and best practice recommendations. A written personalized care plan for preventive services as well as general preventive health recommendations were provided to patient.     Derrell Lolling, CMA   02/01/2023   After Visit Summary: (In  Person-Printed) AVS printed and given to the patient  Nurse Notes: IN PERSON [facility]    Ms. Kampf , Thank you for taking time to come for your Medicare Wellness Visit. I appreciate your ongoing commitment to your health goals. Please review the following plan we discussed and let me know if I can assist you in the future.   These are the goals we discussed:  Goals   None     This is a list of the screening recommended for you and due dates:  Health Maintenance  Topic Date Due   Complete foot exam   Never done   Eye exam for diabetics  Never done   Yearly kidney health urinalysis for diabetes  Never done   DTaP/Tdap/Td vaccine (1 - Tdap) Never done   Zoster (Shingles) Vaccine (1 of 2) Never done   Flu Shot  09/17/2022   COVID-19 Vaccine (4 - 2024-25 season) 10/18/2022   Screening for Lung Cancer  04/10/2023   Hemoglobin A1C  08/02/2023   Yearly kidney function blood test for diabetes  11/11/2023   Medicare Annual Wellness Visit  02/01/2024   Mammogram  08/31/2024   Colon Cancer Screening  09/05/2025   Pneumonia Vaccine  Completed   DEXA scan (bone density measurement)  Completed   Hepatitis C Screening  Completed   HIV Screening  Completed   HPV Vaccine  Aged Out

## 2023-02-01 NOTE — Telephone Encounter (Signed)
Refill request received via fax from [facility] for Humira  Last Fill: 11/12/2022  Labs: 11/11/2022  Glucose 103 Total Bilirubin 1.3 Alkaline Phosphatase 201 ALT 32  TB Gold: 02/03/2022 Negative   Next Visit: 02/11/2023  Last Visit: 11/11/2022  DX: Rheumatoid arthritis involving multiple sites with positive rheumatoid factor   Current Dose per office note 11/11/2022: Humira 40 mg subcu q. 14 days.   Okay to refill Humira?

## 2023-02-01 NOTE — Patient Instructions (Signed)
It was a pleasure to care for you today!  Please let me know if you do not hear from the vestibular physical therapy office to schedule your first visit with them.  Please follow up in about 3 months, or sooner if needed.  My best, Dr. August Saucer

## 2023-02-01 NOTE — Assessment & Plan Note (Signed)
Concern at last OV for progression to diabetes given HbA1c 7.1% 07/2022; blood glucose was 105.  Today, blood glucose is 123 and HbA1c is 6.2%. She has cut out sugar as much as possible from her diet and is exercising as much as her body will tolerate in the setting of her chronic illnesses. Plan: Encouraged continued lifestyle modifications including healthy diet and exercise as tolerated.

## 2023-02-01 NOTE — Progress Notes (Signed)
Office Visit Note  Patient: Heidi Chapman             Date of Birth: Jan 01, 1956           MRN: [id number]             PCP: Champ Mungo, DO Referring: Champ Mungo, DO Visit Date: 02/11/2023   Subjective:  Follow-up (Patient states she needs a refill of Humira. )   Discussed the use of AI scribe software for clinical note transcription with the patient, who gave verbal consent to proceed.  History of Present Illness   Heidi Chapman is a 67 y.o. female here for follow up for seropositive RA on Humira 40 mg subcu q. 14 days.  She presents with increased hand pain, particularly noticeable with the onset of cold weather. They describe the pain as inflammatory rather than swelling, and have increased their use of tramadol and muscle relaxers to manage the discomfort. They deny any associated redness or swelling.  They also report experiencing dizziness, which has led to a couple of blackouts and falls. They are currently under the care of their family doctor for this issue, and are awaiting an appointment with an ENT specialist for suspected positional vertigo.  She reports no issues with the injections. Her thumb trigger finger symptoms are improved without specific intervention for this.  They describe a radiating pain down their spine, which worsens with movement. They also report stiffness in their wrists, particularly the right one, and tenderness in their fingers.  The patient also reports popping in their knees and hips, which can sometimes be painful. They note that their joint stiffness and discomfort typically worsen around lunchtime, after they have been up and moving for a while, and continue to worsen by bedtime. They deny any morning stiffness or discomfort in their ankles or heels.     Previous HPI 11/11/2022 Heidi Chapman is a 67 y.o. female here for follow up for seropositive RA on Humira 40 mg subcu q. 14 days.  No significant flareup since her last visit.  She has  noticed some increase in joint pain and stiffness in her hands mostly in her shoulders for the past week associates this with the change in weather.  Otherwise feels Humira is working pretty well notices a slight increase in morning stiffness and possibly the swelling in finger joints for a few days between doses.   Previous HPI 08/11/2022 Heidi Chapman is a 67 y.o. female here for follow up for seropositive RA on Humira 40 mg subcu q. 14 days.  She had another interruption in treatment associated with change in insurance status so just restarted this in the past month and has taken 2 doses so far.  The interruption of about 6 weeks duration and Humira treatment she experienced noticeable increase in hand pain and stiffness and swelling especially the several nodules on the fingers both right and left hand.  Bilateral shoulder pain also improved after resuming Humira.  Low back and hip pain has been a persistent problem so far not improved as much with resuming Humira.  This varies a lot from day-to-day sometimes more in the middle back and sometimes very low more around the low back and pelvis.  Hip pain is usually worse after driving or otherwise sitting still for a long duration.  Not having much complaint about her knees or legs she associates this more with fibromyalgia pain which is more stress triggered and has not been a  recent flareup.   Previous HPI 05/07/2022 Heidi Chapman is a 67 y.o. female here for follow up for seropositive RA on Humira 40 mg subcu q. 14 days.  Since her last visit she has been on the medicine pretty consistently did have interruptus with some type of upper respiratory illness around 2 weeks ago.  She thinks this was similar to influenza but less severe.  Did not require antibiotics.  While taking the medicine without interruption sees a good benefit notices joint pain and stiffness worsening for about 3 to 4 days prior to each injection being due.  She is using a heat  pack on her hands overnight also helps with the pain and stiffness.  Currently her back pain is the most limiting symptom she is following up with Dr. Alice Reichert for this on treatment with Flexeril and tramadol.  She had updated lumbar spine x-ray showing radiographic progression since 2019.  Currently working on a home exercise program for core strengthening during the past 2 weeks has not seen a significant improvement so far.   Previous HPI 02/03/22 Heidi Chapman is a 67 y.o. female here for follow up for seropositive RA on Humira 40 mg [place] q14 days. Since resuming the Humira she notices improvement in her hand stiffness and swelling although still has some pain. Particularly nodules on the back of her joints hurt a lot with any minor bump or pressure.    Previous HPI 11/06/21 Heidi Chapman is a 67 y.o. female here for follow up for seropositive RA. She was taking Humira 40 mg [place] q14days with pretty stable symptoms but has missed the last 2 doses due to change in insurance and needing medicine pre-approval again. She has increased joint pain and stiffness especially in hands and wrists.   Previous HPI 08/06/2021 Heidi Chapman is a 67 y.o. female here for follow up for seropositive RA on Humira 40 mg [place] q14days started in March. She has generalized pain and fatigue related to fibromyalgia syndrome and DJD of her back, followed by Dr. Berline Chough currently on duloxetine and PRN tramadol. Symptoms are doing somewhat better. She still feels daily stiffness but is not seeing much swelling and pain is decreased in elbow and hands and can tightly close fists. She has had several URI type symptoms intermittently feels like she gets sick when her granddaughter brings germs home from school, no antibiotics treatment or pneumonia.   Previous HPI 06/04/21 Heidi Chapman is a 67 y.o. female here for follow up for seropositive RA on Humira 40 mg [place] q14days started last month. Previous MTX treatment was not  resumed due to abnormal baseline LFTs. She felt improvement after about 3 days on prednisone taper. Inflammation remains better but came back somewhat, but no large swollen nodules on the neck recently. Duloxetine is helping her chronic pain overall but still hurts with any activity more than an hour. She followed up with her neurology for the sleep apnea and fatigue still very fatigued limiting her activity.   Previous HPI 03/13/21 Heidi Chapman is a 67 y.o. female here for seropositive rheumatoid arthritis which has been treated with methotrexate 17.5 mg [place] weekly and simponi aria infusion and taking folic acid 3 mg daily. In addition to RA she suffers pain with OA including lumbar spine DJD and fibromyalgia pain treated with naproxen 500 mg PRN and flexeril.  She reports joint pain started since many years ago particularly with progressive pain and stiffness and then lateral deviation  worsening in MCP joints of both hands.  She also had chronic osteoarthritis particularly in her back with sciatica and back pain at multiple levels.  She finally went for evaluation of the hand pain and deformities since about 3 years ago was diagnosed with seropositive rheumatoid arthritis.  Initial treatments tried include leflunomide which was not tolerated and Enbrel which did not have clinical response.  Oral methotrexate was not tolerable she had some degree of nausea with the subcutaneous methotrexate as well but not as bad.  While on treatment she felt disease control was very good mostly just had the chronic OA type symptoms not much swelling and no progression of deformities. She has been a patient with Dr. Nickola Major needing to transfer care due to change in insurance status.  She is now off all of her medications since about a month ago and is noticing worsening joint pains again.  Particularly since 2 weeks ago she has much worse neck pain is developed lymph node swelling on the back of her skull and neck.  She went  to the emergency department for this were no specific problems were found liver function test showed a mild elevation she was prescribed Keflex empirically for possible infection has not noticed any difference since taking this medicine. Besides joint pain and lymph node swelling she feels extremely fatigued overall.   DMARD Hx Enbrel primary nonresponder Simponi aria TOC Methotrexate  TOC Methotrexate PO GI intolerance Leflunomide not tolerated   Labs reviewed 02/2021 ESR 26 CBC wnl CMP AST 43 ALT 56 Alk phos 149   03/2019 TB neg HBV neg HCV neg   01/2019 RA 109 CCP >250   10/2017 HIV neg   Review of Systems  Constitutional:  Positive for fatigue.  HENT:  Positive for mouth dryness. Negative for mouth sores.   Eyes:  Positive for dryness.  Respiratory:  Positive for shortness of breath.   Cardiovascular:  Negative for chest pain and palpitations.  Gastrointestinal:  Negative for blood in stool, constipation and diarrhea.  Endocrine: Negative for increased urination.  Genitourinary:  Negative for involuntary urination.  Musculoskeletal:  Positive for joint pain, gait problem, joint pain, joint swelling, myalgias and myalgias. Negative for muscle weakness, morning stiffness and muscle tenderness.  Skin:  Positive for sensitivity to sunlight. Negative for color change, rash and hair loss.  Allergic/Immunologic: Negative for susceptible to infections.  Neurological:  Positive for dizziness and headaches.  Hematological:  Positive for swollen glands.  Psychiatric/Behavioral:  Negative for depressed mood and sleep disturbance. The patient is nervous/anxious.     PMFS History:  Patient Active Problem List   Diagnosis Date Noted   Dizziness 02/01/2023   Normocytic anemia 08/26/2021   Fibromyalgia 06/04/2021   Depression with anxiety 05/20/2021   Chronic bilateral back pain 03/19/2021   Other idiopathic scoliosis, thoracolumbar region 03/19/2021   Lumbar radiculopathy  03/19/2021   High risk medication use 03/13/2021   Body mass index (BMI) 30.0-30.9, adult 03/13/2021   Osteoarthritis 11/18/2020   Obstructive sleep apnea 08/29/2020   Chronic fatigue 06/28/2020   Hyperlipidemia 06/28/2020   Prediabetes 06/28/2020   History of tobacco abuse 06/28/2020   Rheumatoid arthritis involving multiple sites with positive rheumatoid factor (HCC) 03/13/2019    Past Medical History:  Diagnosis Date   Absence of both cervix and uterus, acquired 12/14/2011   Acalculous cholecystitis 10/23/2017   Acquired deformity of right hand 03/13/2021   Acute left-sided low back pain without sciatica 03/13/2019   Anxiety  Anxiety 01/26/2019   Arthritis    Benign neoplasm of colon 12/14/2011   Diabetes (HCC)    Fatigue 06/28/2020   Fibromyalgia    GERD (gastroesophageal reflux disease)    Healthcare maintenance 12/06/2017   High risk medication use 03/13/2021   Lymphadenopathy 03/13/2021   Migraine    Neck pain 03/13/2021   Orthopnea 08/29/2020   Overweight 03/13/2021   Polyarthralgia 01/26/2019   Screening for colon cancer 06/28/2020   Screening mammogram for breast cancer 06/28/2020   Shortness of breath at rest 08/29/2020   Snoring 08/29/2020    Family History  Problem Relation Age of Onset   Cancer - Lung Mother    Brain cancer Mother    Colon polyps Father    Prostate cancer Father    Diverticulitis Father    Heart attack Father    Cancer Maternal Grandmother    Cancer Maternal Grandfather    Cancer Paternal Grandfather    Past Surgical History:  Procedure Laterality Date   CESAREAN SECTION     CHOLECYSTECTOMY N/A 10/24/2017   Procedure: LAPAROSCOPIC CHOLECYSTECTOMY WITH INTRAOPERATIVE CHOLANGIOGRAM;  Surgeon: Harriette Bouillon, MD;  Location: [facility] OR;  Service: General;  Laterality: N/A;   GALLBLADDER SURGERY     TUBAL LIGATION     Social History   Social History Narrative   Lives with youngest daughter and granddaughter   Right handed    Caffeine: 1 cup of coffee and 1 soda daily   Immunization History  Administered Date(s) Administered   Influenza,inj,Quad PF,6+ Mos 01/26/2019, 11/18/2020, 02/23/2022   PFIZER(Purple Top)SARS-COV-2 Vaccination 05/13/2019, 06/07/2019, 10/30/2019   PNEUMOCOCCAL CONJUGATE-20 08/13/2022     Objective: Vital Signs: BP 110/73 (BP Location: Right Arm, Patient Position: Sitting, Cuff Size: Normal)   Pulse 89   Resp 14   Ht 5\' 5"  (1.651 m)   Wt 183 lb (83 kg)   BMI 30.45 kg/m    Physical Exam Eyes:     Conjunctiva/sclera: Conjunctivae normal.  Cardiovascular:     Rate and Rhythm: Normal rate and regular rhythm.  Pulmonary:     Effort: Pulmonary effort is normal.     Breath sounds: Normal breath sounds.  Musculoskeletal:     Right lower leg: No edema.     Left lower leg: No edema.  Lymphadenopathy:     Cervical: No cervical adenopathy.  Skin:    General: Skin is warm and dry.     Findings: No rash.  Neurological:     Mental Status: She is alert.  Psychiatric:        Mood and Affect: Mood normal.      Musculoskeletal Exam:  Shoulders full ROM some pain with overhead abduction and with rotation while raised Elbows full ROM no tenderness or swelling Right wrist stiffness, no swelling Fingers full ROM, reducible lateral deviation more pronounced on left, MCP tenderness to pressure, left 2nd MCP nodule on radial side Widespread tenderness throughout paraspinal muscles, across traps, on lateral hips b/l Knee and ankle crepitus b/l, no effusions no tenderness   Investigation: No additional findings.  Imaging: No results found.  Recent Labs: Lab Results  Component Value Date   WBC 5.5 11/11/2022   HGB 14.5 11/11/2022   PLT 253 11/11/2022   NA 140 11/11/2022   K 4.3 11/11/2022   CL 105 11/11/2022   CO2 25 11/11/2022   GLUCOSE 103 (H) 11/11/2022   BUN 15 11/11/2022   CREATININE 0.75 11/11/2022   BILITOT 1.3 (H) 11/11/2022   ALKPHOS 156 (  H) 08/17/2021   AST 26  11/11/2022   ALT 32 (H) 11/11/2022   PROT 7.1 11/11/2022   ALBUMIN 3.1 (L) 08/17/2021   CALCIUM 9.6 11/11/2022   GFRAA 84 01/26/2019   QFTBGOLDPLUS NEGATIVE 02/03/2022    Speciality Comments: Leflunomide - intolerance; Enbrel - inadequate response; oral MTX - intolerable side effects; SImponi Aria infusions stopped in Jan 2023 and switched to Humira for convenience of self-injection; MTX stopped in Feb 2023 due to elevated LFTs Humira started 04/17/21  Procedures:  No procedures performed Allergies: Azithromycin, Leflunomide, Peanut butter flavoring agent (non-screening), and Latex   Assessment / Plan:     Visit Diagnoses: Rheumatoid arthritis involving multiple sites with positive rheumatoid factor (HCC) - Plan: Sedimentation rate Increased hand pain and stiffness, particularly in cold weather. No significant swelling or redness. Increased use of Tramadol and muscle relaxants for symptom management. Humira injections well-tolerated, but due for refill. -Checking sed rate for disease activity monitoring -Continue Humira 40 mg [place] q14days  High risk medication use - Humira 40 mg subcu q. 14 days. - Plan: CBC with Differential/Platelet, COMPLETE METABOLIC PANEL WITH GFR, QuantiFERON-TB Gold Plus No serious interval infections. -Checking CBC and CMP and Quantiferon TB screening.  Trigger finger of left thumb Fibromyalgia - Flexeril 10 mg twice a day Thumb improved without specific intervention. Generalized OA and FMS pain worse than usual but controlled. On flexeril, tramadol, and symmetrel with Dr. Berline Chough with good improvement.  Vertigo Recent episodes of dizziness and falls, suspected to be vertigo-related. Referred by PCP office to PT vestibular rehab  Orders: Orders Placed This Encounter  Procedures   Sedimentation rate   CBC with Differential/Platelet   COMPLETE METABOLIC PANEL WITH GFR   QuantiFERON-TB Gold Plus   No orders of the defined types were placed in this  encounter.    Follow-Up Instructions: Return in about 3 months (around 05/12/2023) for RA on ADA f/u 3mos.   Fuller Plan, MD  Note - This record has been created using [employer].  Chart creation errors have been sought, but may not always  have been located. Such creation errors do not reflect on  the standard of medical care.

## 2023-02-01 NOTE — Patient Instructions (Signed)

## 2023-02-01 NOTE — Progress Notes (Addendum)
CC: dizziness, routine f/u  HPI:  [name] is a 67 y.o. female with past medical history as detailed below who presents for routine f/u with additional complaint of dizziness. Please see problem based charting for detailed assessment and plan.  Past Medical History:  Diagnosis Date   Absence of both cervix and uterus, acquired 12/14/2011   Acalculous cholecystitis 10/23/2017   Acquired deformity of right hand 03/13/2021   Acute left-sided low back pain without sciatica 03/13/2019   Anxiety    Anxiety 01/26/2019   Arthritis    Benign neoplasm of colon 12/14/2011   Diabetes (HCC)    Fatigue 06/28/2020   Fibromyalgia    GERD (gastroesophageal reflux disease)    Healthcare maintenance 12/06/2017   High risk medication use 03/13/2021   Lymphadenopathy 03/13/2021   Migraine    Neck pain 03/13/2021   Orthopnea 08/29/2020   Overweight 03/13/2021   Polyarthralgia 01/26/2019   Screening for colon cancer 06/28/2020   Screening mammogram for breast cancer 06/28/2020   Shortness of breath at rest 08/29/2020   Snoring 08/29/2020   Review of Systems:  Negative unless otherwise stated.  Physical Exam:  Vitals:   02/01/23 0957  BP: 123/81  Pulse: 99  Temp: 98.1 F (36.7 C)  Weight: 181 lb 14.4 oz (82.5 kg)  Height: 5\' 5"  (1.651 m)   Constitutional:Well appearing, in no acute distress. Cardio:Regular rate and rhythm. No murmurs, rubs, or gallops. No carotid bruits appreciated. Pulm:Clear to auscultation bilaterally. Normal work of breathing on room air. WUJ:[id number] for extremity edema. Full active range of motion. Skin:Warm and dry. Neuro: Mental Status: Patient is awake, alert, oriented x3. No signs of aphasia or neglect.  Cranial Nerves: III,IV, VI: EOMI without ptosis or diploplia.  V: Facial sensation is symmetric to light touch and temperature. VII: Facial movement is symmetric.  VIII: Hearing is intact to voice XI: Shoulder shrug is symmetric. Motor:  Normal effort thorughout, at least 5/5 bilateral UE, 5/5 bilateral LE. Sensory: Sensation is grossly intact and equal bilateral UE & LE. Negative Romberg test. Subjective dizziness when standing with eyes closed, when EOM tested to extreme R and L, and when returning head from extended to neutral positions. No nystagmus appreciated. Psych:Pleasant mood and affect.  Assessment & Plan:   See Encounters Tab for problem based charting.  Hyperlipidemia OP regimen is atorvastatin 10 mg daily which she is compliant with. Lipid panel last checked 07/2022 with LDL at goal. Plan: Continue atorvastatin 10 mg daily.  Prediabetes Concern at last OV for progression to diabetes given HbA1c 7.1% 07/2022; blood glucose was 105.  Today, blood glucose is 123 and HbA1c is 6.2%. She has cut out sugar as much as possible from her diet and is exercising as much as her body will tolerate in the setting of her chronic illnesses. Plan: Encouraged continued lifestyle modifications including healthy diet and exercise as tolerated.   Dizziness Describes exacerbation with some changes of her chronic vertigo. She has had trouble with vertigo specifically when she was younger in her teenage years and has had dizziness with stairs. In the last several years she hasn't lived in a home where the use of stairs was necessary but now that she does, she has noticed her symptoms more.   Prior episodes of vertigo had significant nausea, some vomiting, and room spinning. She had Epley maneuver performed which helped her symptoms. At this time she has some mild nausea but has not had vomiting.   She describes the  sensation as feeling more like motion or sea sickness. She denies vision changes or trouble with her vision except during these episodes where her vision goes black for a split second and she falls. She has had 2 falls since November, one while taking stairs and the other when she turned her head to the side too quickly. She  did not get hurt during these falls.   She is eating and drinking okay. Denies palpitations. No association of symptoms with increased heat. She does not lose consciousness during these episodes.  She does not have vision changes with head position or when looking around but does note feeling this dizzy feeling when looking to horizontal extremes or when returning her head from neck extension to neutral on exam. When her eyes are closed, her symptoms are exacerbated.   No carotid bruit. Neurological exam is normal. No obvious/new medications that would be likely to be causing this. Orthostatic VS are negative:   Sitting BP 124/81 HR 87 Laying BP 115/87 HR 78 Standing BP 123/77 HR 105  Favor BPPV over vascular etiology of patient's symptoms.  Plan: Will place referral for vestibular PT. Advised patient to use extreme caution when using stairs and head movements. Will plan for follow up in about 3 months to re-evaluate, or sooner if needed.  Patient discussed with Dr.  Lafonda Mosses

## 2023-02-02 NOTE — Progress Notes (Signed)
Internal Medicine Clinic Attending  Case discussed with the resident at the time of the visit.  We reviewed the resident's history and exam and pertinent patient test results.  I agree with the assessment, diagnosis, and plan of care documented in the resident's note.  

## 2023-02-04 NOTE — Progress Notes (Signed)
[facility] Medicine Clinic Attending  Case and documentation of Dr. Marlou Sa  soon after the resident saw the patient reviewed.  I reviewed the AWV findings.  I agree with the assessment, diagnosis, and plan of care documented in the AWV note.

## 2023-02-11 ENCOUNTER — Ambulatory Visit: Payer: Medicare HMO | Attending: Internal Medicine | Admitting: Internal Medicine

## 2023-02-11 ENCOUNTER — Encounter: Payer: Self-pay | Admitting: Internal Medicine

## 2023-02-11 VITALS — BP 110/73 | HR 89 | Resp 14 | Ht 65.0 in | Wt 183.0 lb

## 2023-02-11 DIAGNOSIS — M0579 Rheumatoid arthritis with rheumatoid factor of multiple sites without organ or systems involvement: Secondary | ICD-10-CM

## 2023-02-11 DIAGNOSIS — M65312 Trigger thumb, left thumb: Secondary | ICD-10-CM | POA: Diagnosis not present

## 2023-02-11 DIAGNOSIS — M797 Fibromyalgia: Secondary | ICD-10-CM | POA: Diagnosis not present

## 2023-02-11 DIAGNOSIS — Z79899 Other long term (current) drug therapy: Secondary | ICD-10-CM | POA: Diagnosis not present

## 2023-02-12 NOTE — Progress Notes (Signed)
Sed rate remains slightly above normal at 34 but the same as 3 months ago. Liver enzyme test are slightly elevated with alkaline phosphatase 244 AST 46 and ALT of 50.  These are only mild elevations that are new change compared to previous values.  I think is okay to monitor for now but if there is persistent elevation or a trend over time would need more evaluation.

## 2023-02-13 LAB — CBC WITH DIFFERENTIAL/PLATELET
Absolute Lymphocytes: 1989 {cells}/uL (ref 850–3900)
Absolute Monocytes: 609 {cells}/uL (ref 200–950)
Basophils Absolute: 58 {cells}/uL (ref 0–200)
Basophils Relative: 1 %
Eosinophils Absolute: 220 {cells}/uL (ref 15–500)
Eosinophils Relative: 3.8 %
HCT: 43.6 % (ref 35.0–45.0)
Hemoglobin: 14.2 g/dL (ref 11.7–15.5)
MCH: 28.7 pg (ref 27.0–33.0)
MCHC: 32.6 g/dL (ref 32.0–36.0)
MCV: 88.1 fL (ref 80.0–100.0)
MPV: 11.7 fL (ref 7.5–12.5)
Monocytes Relative: 10.5 %
Neutro Abs: 2923 {cells}/uL (ref 1500–7800)
Neutrophils Relative %: 50.4 %
Platelets: 202 10*3/uL (ref 140–400)
RBC: 4.95 10*6/uL (ref 3.80–5.10)
RDW: 13.6 % (ref 11.0–15.0)
Total Lymphocyte: 34.3 %
WBC: 5.8 10*3/uL (ref 3.8–10.8)

## 2023-02-13 LAB — QUANTIFERON-TB GOLD PLUS
Mitogen-NIL: >10 [IU]/mL
NIL: 0.06 [IU]/mL
QuantiFERON-TB Gold Plus: NEGATIVE
TB1-NIL: <0 [IU]/mL
TB2-NIL: <0 [IU]/mL

## 2023-02-13 LAB — COMPLETE METABOLIC PANEL WITH GFR
AG Ratio: 1.4 (calc) (ref 1.0–2.5)
ALT: 50 U/L — ABNORMAL HIGH (ref 6–29)
AST: 46 U/L — ABNORMAL HIGH (ref 10–35)
Albumin: 4.2 g/dL (ref 3.6–5.1)
Alkaline phosphatase (APISO): 244 U/L — ABNORMAL HIGH (ref 37–153)
BUN: 16 mg/dL (ref 7–25)
CO2: 26 mmol/L (ref 20–32)
Calcium: 9.4 mg/dL (ref 8.6–10.4)
Chloride: 106 mmol/L (ref 98–110)
Creat: 0.75 mg/dL (ref 0.50–1.05)
Globulin: 2.9 g/dL (ref 1.9–3.7)
Glucose, Bld: 81 mg/dL (ref 65–99)
Potassium: 4.2 mmol/L (ref 3.5–5.3)
Sodium: 141 mmol/L (ref 135–146)
Total Bilirubin: 1.2 mg/dL (ref 0.2–1.2)
Total Protein: 7.1 g/dL (ref 6.1–8.1)
eGFR: 88 mL/min/{1.73_m2} (ref >=60)

## 2023-02-13 LAB — SEDIMENTATION RATE: Sed Rate: 34 mm/h — ABNORMAL HIGH (ref 0–30)

## 2023-02-22 ENCOUNTER — Telehealth: Payer: Self-pay | Admitting: Internal Medicine

## 2023-02-22 DIAGNOSIS — Z79899 Other long term (current) drug therapy: Secondary | ICD-10-CM

## 2023-02-22 DIAGNOSIS — M0579 Rheumatoid arthritis with rheumatoid factor of multiple sites without organ or systems involvement: Secondary | ICD-10-CM

## 2023-02-22 NOTE — Telephone Encounter (Signed)
 Patient contacted the office to request a medication refill.   1. Name of Medication: Humira   2. How are you currently taking this medication (dosage and times per day)? 1 injection every 14 days   3. What pharmacy would you like for that to be sent to?  [facility]   Patient states she never received the prescription that Dr. Jeannetta sent in December.

## 2023-02-22 NOTE — Telephone Encounter (Signed)
 I called patient, Dr. Dimple Casey sent RX for Humira on 02/01/2023 to pharmacy, patient will call to check on shipment.

## 2023-02-25 DIAGNOSIS — H524 Presbyopia: Secondary | ICD-10-CM | POA: Diagnosis not present

## 2023-02-25 DIAGNOSIS — H43813 Vitreous degeneration, bilateral: Secondary | ICD-10-CM | POA: Diagnosis not present

## 2023-02-25 DIAGNOSIS — H25013 Cortical age-related cataract, bilateral: Secondary | ICD-10-CM | POA: Diagnosis not present

## 2023-02-25 DIAGNOSIS — H2513 Age-related nuclear cataract, bilateral: Secondary | ICD-10-CM | POA: Diagnosis not present

## 2023-02-25 DIAGNOSIS — H04123 Dry eye syndrome of bilateral lacrimal glands: Secondary | ICD-10-CM | POA: Diagnosis not present

## 2023-02-25 DIAGNOSIS — H5203 Hypermetropia, bilateral: Secondary | ICD-10-CM | POA: Diagnosis not present

## 2023-03-03 ENCOUNTER — Telehealth: Payer: Self-pay | Admitting: Pharmacist

## 2023-03-03 NOTE — Telephone Encounter (Signed)
 Received notification from [employer] regarding a prior authorization for HUMIRA . Authorization has been APPROVED from 02/17/2023 to 02/16/2024. Approval letter sent to scan center.

## 2023-03-03 NOTE — Telephone Encounter (Signed)
 Spoke with patient - she completed her form of Humira  PAP renewal application  Provider form has already been signed by Dr. Rodell Citrin  Submitted a Prior Authorization request to [facility] for HUMIRA  via CoverMyMeds. Will update once we receive a response.  Key: [id number]

## 2023-03-04 NOTE — Telephone Encounter (Signed)
Received signed patient form. Submitted Patient Assistance RENEWAL Application to [employer] for HUMIRA along with provider portion, patient portion, PA, medication list, insurance card copy and income documents. Will update patient when we receive a response.  Phone: [phone] Fax: [phone]  Chesley Mires, PharmD, MPH, BCPS, CPP Clinical Pharmacist (Rheumatology and Pulmonology)

## 2023-03-17 ENCOUNTER — Telehealth: Payer: Self-pay | Admitting: *Deleted

## 2023-03-17 NOTE — Telephone Encounter (Signed)
Patient contacted the office requesting a call back from [facility] regarding the patient assistance for medication. Patient can be reached at [phone].

## 2023-03-18 NOTE — Telephone Encounter (Signed)
Spoke with patient - she wanted update for Humira PAP renewal. Advised that we are still awaiting response. She has missed two doses - she will plan to pick up biosimilar tomorrow morning from office ([facility])  Chesley Mires, PharmD, MPH, BCPS, CPP Clinical Pharmacist (Rheumatology and Pulmonology)

## 2023-03-19 NOTE — Telephone Encounter (Signed)
Reserved sample in fridge for patient.

## 2023-03-23 NOTE — Telephone Encounter (Signed)
Patient will come to the office the office 03/24/2023 to pick up her sample.

## 2023-03-23 NOTE — Telephone Encounter (Signed)
 Spoke with [employer] regarding HUMIRA  RENEWAL application. Rep confirmed application was received. Status is currently pending. No additional documentation needed at this time.   Phone: [phone] Fax: [phone]  Tolu Maida Widger, PharmD [facility] PGY-1

## 2023-03-24 NOTE — Telephone Encounter (Signed)
 Medication Samples have been provided to the patient.  Drug name: Amjevita       Strength: 40 mg/     Qty: 1  LOT: [id number]  Exp.Date: 12/17/2023  Dosing instructions: Inject 40 mg into skin every 14 days.

## 2023-03-30 NOTE — Telephone Encounter (Signed)
Received a fax from  [employer] regarding an approval for HUMIRA patient assistance from 03/30/2023 to 02/16/2024. Approval letter sent to scan center.  Phone: [phone] Fax: [phone]  Left VM for patient to advise.  Chesley Mires, PharmD, MPH, BCPS, CPP Clinical Pharmacist (Rheumatology and Pulmonology)

## 2023-04-14 ENCOUNTER — Other Ambulatory Visit: Payer: Self-pay | Admitting: Internal Medicine

## 2023-04-14 ENCOUNTER — Other Ambulatory Visit: Payer: Self-pay | Admitting: Physical Medicine and Rehabilitation

## 2023-04-14 NOTE — Telephone Encounter (Signed)
 Medication sent to pharmacy

## 2023-04-16 ENCOUNTER — Encounter: Payer: Self-pay | Admitting: Physical Medicine and Rehabilitation

## 2023-04-16 ENCOUNTER — Encounter: Payer: Medicare HMO | Attending: Physical Medicine and Rehabilitation | Admitting: Physical Medicine and Rehabilitation

## 2023-04-16 VITALS — BP 122/88 | HR 94 | Ht 65.0 in | Wt 185.0 lb

## 2023-04-16 DIAGNOSIS — Z79891 Long term (current) use of opiate analgesic: Secondary | ICD-10-CM | POA: Insufficient documentation

## 2023-04-16 DIAGNOSIS — Z5181 Encounter for therapeutic drug level monitoring: Secondary | ICD-10-CM | POA: Insufficient documentation

## 2023-04-16 DIAGNOSIS — G894 Chronic pain syndrome: Secondary | ICD-10-CM | POA: Diagnosis not present

## 2023-04-16 DIAGNOSIS — M0579 Rheumatoid arthritis with rheumatoid factor of multiple sites without organ or systems involvement: Secondary | ICD-10-CM | POA: Diagnosis not present

## 2023-04-16 DIAGNOSIS — M797 Fibromyalgia: Secondary | ICD-10-CM | POA: Diagnosis not present

## 2023-04-16 MED ORDER — PROMETHAZINE HCL 12.5 MG PO TABS: ORAL_TABLET | ORAL | 5 refills | Status: DC

## 2023-04-16 MED ORDER — TRAMADOL HCL 50 MG PO TABS: 50.0000 mg | ORAL_TABLET | Freq: Four times a day (QID) | ORAL | 5 refills | Status: DC | PRN

## 2023-04-16 MED ORDER — AMANTADINE HCL 100 MG PO CAPS: 100.0000 mg | ORAL_CAPSULE | Freq: Every day | ORAL | 1 refills | Status: DC

## 2023-04-16 NOTE — Patient Instructions (Signed)
 Pt is a 68 yr old female with hx of RA- hands, elbows, shoulders and neck-  DJD of "entire back"; borderline diabetes; GERD; no HTN. Also has scoliosis 15-20 degrees thoracolumbar and lumbar radiculopathy based on xray results. Also has Fibromyalgia- Dx'd with new DM2 Here for f/u on RA and FMS.     Suggest moving Amantadine to 100 mg at noon daily. Needs refills sent in for her.     2. Con't Tramadol- 50 mg q6 hours prn- #120-  - you can during a flare- can take 8 tabs/day max  (so 2 tabs 4x/day) during flare as long as doesn't take more than 120 tabs/month.  Send in refills for 6 months.   3. Con't Phenergan as needed- needs refills- esp for injections nausea after Humira.    4. Oral drug screen- per clinic policy-    5. Con't Opiate contract. Pt knows rules   6. F/U in 6 months- since stable. F/u on FMS/RA

## 2023-04-16 NOTE — Addendum Note (Signed)
 Addended by: Silas Sacramento T on: 04/16/2023 02:25 PM   Modules accepted: Orders

## 2023-04-16 NOTE — Progress Notes (Signed)
 Subjective:    Patient ID: Heidi Chapman, female    DOB: [birth year], 68 y.o.   MRN: [id number]  HPI  Pt is a 68 yr old female with hx of RA- hands, elbows, shoulders and neck-  DJD of "entire back"; borderline diabetes; GERD; no HTN. Also has scoliosis 15-20 degrees thoracolumbar and lumbar radiculopathy based on xray results. Also has Fibromyalgia- Dx'd with new DM2 Here for f/u on RA and FMS.    Had some flares due to delays with new year- and delays with Humira- and funding assistance for that.   In good spirits.   Has lost a few minutes  Started walking every afternoon-  1 week ago-  Has perfect forcaster body.   Still on low sugar diet- and A1c coming down- down to 6.2 from 7.1 Cheats from time to time.   Needs refills for meds.   Esp needs Amantadine- not sure if helps.   Sometimes feels dopey might keep things leveled out a bit.  Brain calms down and easier to focus until late afternoon.  Takes around 10am     Pain Inventory Average Pain 10 Pain Right Now 3 My pain is sharp, dull, stabbing, and aching  In the last 24 hours, has pain interfered with the following? General activity 0 Relation with others 0 Enjoyment of life 0 What TIME of day is your pain at its worst? evening, night, and varies Sleep (in general) Fair  Pain is worse with:  cold weather Pain improves with: heat/ice and medication Relief from Meds: 8  Family History  Problem Relation Age of Onset   Cancer - Lung Mother    Brain cancer Mother    Colon polyps Father    Prostate cancer Father    Diverticulitis Father    Heart attack Father    Cancer Maternal Grandmother    Cancer Maternal Grandfather    Cancer Paternal Grandfather    Social History   Socioeconomic History   Marital status: Widowed    Spouse name: Not on file   Number of children: Not on file   Years of education: Not on file   Highest education level: Not on file  Occupational History   Not on file  Tobacco  Use   Smoking status: Former    Current packs/day: 0.00    Average packs/day: 0.5 packs/day for 40.0 years (20.0 ttl pk-yrs)    Types: Cigarettes    Start date: 02/14/1981    Quit date: 02/14/2021    Years since quitting: 2.1    Passive exposure: Past   Smokeless tobacco: Never  Vaping Use   Vaping status: Never Used  Substance and Sexual Activity   Alcohol use: Yes    Comment: 1-3 drinks annually   Drug use: Never   Sexual activity: Not on file  Other Topics Concern   Not on file  Social History Narrative   Lives with youngest daughter and granddaughter   Right handed   Caffeine: 1 cup of coffee and 1 soda daily   Social Drivers of Health   Financial Resource Strain: Low Risk  (02/01/2023)   Overall Financial Resource Strain (CARDIA)    Difficulty of Paying Living Expenses: Not hard at all  Food Insecurity: No Food Insecurity (02/01/2023)   Hunger Vital Sign    Worried About Running Out of Food in the Last Year: Never true    Ran Out of Food in the Last Year: Never true  Transportation Needs: No Transportation  Needs (02/01/2023)   PRAPARE - Administrator, Civil Service (Medical): No    Lack of Transportation (Non-Medical): No  Physical Activity: Insufficiently Active (02/01/2023)   Exercise Vital Sign    Days of Exercise per Week: 2 days    Minutes of Exercise per Session: 40 min  Stress: No Stress Concern Present (02/01/2023)   [employer] of Occupational Health - Occupational Stress Questionnaire    Feeling of Stress : Not at all  Social Connections: Moderately Isolated (02/01/2023)   Social Connection and Isolation Panel [NHANES]    Frequency of Communication with Friends and Family: More than three times a week    Frequency of Social Gatherings with Friends and Family: More than three times a week    Attends Religious Services: More than 4 times per year    Active Member of [employer] or Organizations: No    Attends Banker Meetings:  Never    Marital Status: Widowed   Past Surgical History:  Procedure Laterality Date   CESAREAN SECTION     CHOLECYSTECTOMY N/A 10/24/2017   Procedure: LAPAROSCOPIC CHOLECYSTECTOMY WITH INTRAOPERATIVE CHOLANGIOGRAM;  Surgeon: Harriette Bouillon, MD;  Location: [facility] OR;  Service: General;  Laterality: N/A;   GALLBLADDER SURGERY     TUBAL LIGATION     Past Surgical History:  Procedure Laterality Date   CESAREAN SECTION     CHOLECYSTECTOMY N/A 10/24/2017   Procedure: LAPAROSCOPIC CHOLECYSTECTOMY WITH INTRAOPERATIVE CHOLANGIOGRAM;  Surgeon: Harriette Bouillon, MD;  Location: [facility] OR;  Service: General;  Laterality: N/A;   GALLBLADDER SURGERY     TUBAL LIGATION     Past Medical History:  Diagnosis Date   Absence of both cervix and uterus, acquired 12/14/2011   Acalculous cholecystitis 10/23/2017   Acquired deformity of right hand 03/13/2021   Acute left-sided low back pain without sciatica 03/13/2019   Anxiety    Anxiety 01/26/2019   Arthritis    Benign neoplasm of colon 12/14/2011   Diabetes (HCC)    Fatigue 06/28/2020   Fibromyalgia    GERD (gastroesophageal reflux disease)    Healthcare maintenance 12/06/2017   High risk medication use 03/13/2021   Lymphadenopathy 03/13/2021   Migraine    Neck pain 03/13/2021   Orthopnea 08/29/2020   Overweight 03/13/2021   Polyarthralgia 01/26/2019   Screening for colon cancer 06/28/2020   Screening mammogram for breast cancer 06/28/2020   Shortness of breath at rest 08/29/2020   Snoring 08/29/2020   BP 122/88   Pulse 94   Ht 5\' 5"  (1.651 m)   Wt 185 lb (83.9 kg)   SpO2 94%   BMI 30.79 kg/m   Opioid Risk Score:   Fall Risk Score:  `1  Depression screen [facility] 2/9     10/16/2022   11:08 AM 08/13/2022    2:43 PM 07/17/2022   10:11 AM 04/17/2022    1:36 PM 02/23/2022   10:40 AM 09/15/2021    9:27 AM 08/26/2021    2:37 PM  Depression screen PHQ 2/9  Decreased Interest 0 0 0 0  0 0  Down, Depressed, Hopeless 0 0 0 0 0 0 0  PHQ - 2 Score 0 0  0 0 0 0 0  Altered sleeping  0   3    Tired, decreased energy  3   3    Change in appetite  0   0    Feeling bad or failure about yourself   0   0    Trouble  concentrating  3   3    Moving slowly or fidgety/restless  3   3    Suicidal thoughts  0   0    PHQ-9 Score  9   12    Difficult doing work/chores  Not difficult at all   Somewhat difficult        Review of Systems  Musculoskeletal:  Positive for back pain and neck pain.       Bilateral hand and ankle pain  Neurological:  Positive for numbness.  All other systems reviewed and are negative.      Objective:   Physical Exam        Assessment & Plan:    Pt is a 68 yr old female with hx of RA- hands, elbows, shoulders and neck-  DJD of "entire back"; borderline diabetes; GERD; no HTN. Also has scoliosis 15-20 degrees thoracolumbar and lumbar radiculopathy based on xray results. Also has Fibromyalgia- Dx'd with new DM2 Here for f/u on RA and FMS.     Suggest moving Amantadine to 100 mg at noon daily. Needs refills sent in for her.     2. Con't Tramadol- 50 mg q6 hours prn- #120-  - you can during a flare- can take 8 tabs/day max  (so 2 tabs 4x/day) during flare as long as doesn't take more than 120 tabs/month.  Send in refills for 6 months.   3. Con't Phenergan as needed- needs refills- esp for injections nausea after Humira.    4. Oral drug screen- per clinic policy-    5. Con't Opiate contract. Pt knows rules   6. F/U in 6 months- since stable. F/u on FMS/RA  I spent a total of 21   minutes on total care today- >50% coordination of care- due to d/w pt about pain, changing doses and timing of amantadine-

## 2023-04-20 LAB — DRUG TOX MONITOR 1 W/CONF, ORAL FLD

## 2023-04-20 LAB — DRUG TOX ALC METAB W/CON, ORAL FLD: Alcohol Metabolite: NEGATIVE ng/mL (ref <25)

## 2023-04-26 ENCOUNTER — Ambulatory Visit: Payer: Medicare HMO | Admitting: Student

## 2023-04-26 VITALS — BP 114/88 | HR 110 | Wt 186.0 lb

## 2023-04-26 DIAGNOSIS — R42 Dizziness and giddiness: Secondary | ICD-10-CM

## 2023-04-26 DIAGNOSIS — Z23 Encounter for immunization: Secondary | ICD-10-CM

## 2023-04-26 DIAGNOSIS — R7303 Prediabetes: Secondary | ICD-10-CM

## 2023-04-26 DIAGNOSIS — Z1231 Encounter for screening mammogram for malignant neoplasm of breast: Secondary | ICD-10-CM

## 2023-04-26 DIAGNOSIS — Z0000 Encounter for general adult medical examination without abnormal findings: Secondary | ICD-10-CM

## 2023-04-26 LAB — POCT GLYCOSYLATED HEMOGLOBIN (HGB A1C): Hemoglobin A1C: 6.2 % — AB (ref 4.0–5.6)

## 2023-04-26 LAB — GLUCOSE, CAPILLARY: Glucose-Capillary: 159 mg/dL — ABNORMAL HIGH (ref 70–99)

## 2023-04-26 NOTE — Assessment & Plan Note (Signed)
 Deferred until the fall

## 2023-04-26 NOTE — Progress Notes (Signed)
 CC: Follow-up  HPI:  Heidi Chapman is a 68 y.o. female living with a history stated below and presents today for a follow up for her dizziness. Please see problem based assessment and plan for additional details.  Past Medical History:  Diagnosis Date   Absence of both cervix and uterus, acquired 12/14/2011   Acalculous cholecystitis 10/23/2017   Acquired deformity of right hand 03/13/2021   Acute left-sided low back pain without sciatica 03/13/2019   Anxiety    Anxiety 01/26/2019   Arthritis    Benign neoplasm of colon 12/14/2011   Diabetes (HCC)    Fatigue 06/28/2020   Fibromyalgia    GERD (gastroesophageal reflux disease)    Healthcare maintenance 12/06/2017   High risk medication use 03/13/2021   Lymphadenopathy 03/13/2021   Migraine    Neck pain 03/13/2021   Orthopnea 08/29/2020   Overweight 03/13/2021   Polyarthralgia 01/26/2019   Screening for colon cancer 06/28/2020   Screening mammogram for breast cancer 06/28/2020   Shortness of breath at rest 08/29/2020   Snoring 08/29/2020    Current Outpatient Medications on File Prior to Visit  Medication Sig Dispense Refill   adalimumab (HUMIRA, 2 PEN,) 40 MG/0.4ML pen INJECT 40 MG (0.4 ML) UNDER THE SKIN EVERY 14 DAYS 6 each 0   amantadine (SYMMETREL) 100 MG capsule Take 1 capsule (100 mg total) by mouth daily. 90 capsule 1   atorvastatin (LIPITOR) 10 MG tablet Take 1 tablet by mouth once daily 60 tablet 0   Cholecalciferol (DIALYVITE VITAMIN D 5000 PO) Take by mouth.     cimetidine (TAGAMET) 200 MG tablet Take 200 mg by mouth daily as needed (Acid reflux).     cyclobenzaprine (FLEXERIL) 10 MG tablet Take 1 tablet (10 mg total) by mouth 2 (two) times daily as needed for muscle spasms. 180 tablet 1   Multiple Vitamins-Minerals (MULTIVITAMIN WOMEN 50+) TABS Take 1 tablet by mouth daily.     promethazine (PHENERGAN) 12.5 MG tablet TAKE 1 TABLET BY MOUTH EVERY 6 HOURS AS NEEDED FOR NAUSEA FOR VOMITING 90 tablet 5    traMADol (ULTRAM) 50 MG tablet Take 1 tablet (50 mg total) by mouth every 6 (six) hours as needed. 120 tablet 5   No current facility-administered medications on file prior to visit.    Family History  Problem Relation Age of Onset   Cancer - Lung Mother    Brain cancer Mother    Colon polyps Father    Prostate cancer Father    Diverticulitis Father    Heart attack Father    Cancer Maternal Grandmother    Cancer Maternal Grandfather    Cancer Paternal Grandfather     Social History   Socioeconomic History   Marital status: Widowed    Spouse name: Not on file   Number of children: Not on file   Years of education: Not on file   Highest education level: Not on file  Occupational History   Not on file  Tobacco Use   Smoking status: Former    Current packs/day: 0.00    Average packs/day: 0.5 packs/day for 40.0 years (20.0 ttl pk-yrs)    Types: Cigarettes    Start date: 02/14/1981    Quit date: 02/14/2021    Years since quitting: 2.1    Passive exposure: Past   Smokeless tobacco: Never  Vaping Use   Vaping status: Never Used  Substance and Sexual Activity   Alcohol use: Yes    Comment: 1-3 drinks annually  Drug use: Never   Sexual activity: Not on file  Other Topics Concern   Not on file  Social History Narrative   Lives with youngest daughter and granddaughter   Right handed   Caffeine: 1 cup of coffee and 1 soda daily   Social Drivers of Health   Financial Resource Strain: Low Risk  (02/01/2023)   Overall Financial Resource Strain (CARDIA)    Difficulty of Paying Living Expenses: Not hard at all  Food Insecurity: No Food Insecurity (02/01/2023)   Hunger Vital Sign    Worried About Running Out of Food in the Last Year: Never true    Ran Out of Food in the Last Year: Never true  Transportation Needs: No Transportation Needs (02/01/2023)   PRAPARE - Administrator, Civil Service (Medical): No    Lack of Transportation (Non-Medical): No  Physical  Activity: Insufficiently Active (02/01/2023)   Exercise Vital Sign    Days of Exercise per Week: 2 days    Minutes of Exercise per Session: 40 min  Stress: No Stress Concern Present (02/01/2023)   [employer] of Occupational Health - Occupational Stress Questionnaire    Feeling of Stress : Not at all  Social Connections: Moderately Isolated (02/01/2023)   Social Connection and Isolation Panel [NHANES]    Frequency of Communication with Friends and Family: More than three times a week    Frequency of Social Gatherings with Friends and Family: More than three times a week    Attends Religious Services: More than 4 times per year    Active Member of [employer] or Organizations: No    Attends Banker Meetings: Never    Marital Status: Widowed  Intimate Partner Violence: Not At Risk (02/01/2023)   Humiliation, Afraid, Rape, and Kick questionnaire    Fear of Current or Ex-Partner: No    Emotionally Abused: No    Physically Abused: No    Sexually Abused: No    Review of Systems: ROS negative except for what is noted on the assessment and plan.  Vitals:   04/26/23 0917  BP: 114/88  Pulse: (!) 110  Weight: 186 lb (84.4 kg)    Physical Exam: Constitutional: well-appearing female  in no acute distress Cardiovascular: regular rate and rhythm, no m/r/g Pulmonary/Chest: normal work of breathing on room air, lungs clear to auscultation bilaterally Neurological: alert & oriented x 3, 5/5 strength in bilateral upper and lower extremities, normal gait   Assessment & Plan:   Dizziness Patient presents for follow-up for chronic vertigo.  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  Orthostatics taken at that time were negative.  Since then, her symptoms have resolved.  Prediabetes Patient's A1c in December 2024 was 6.2, decreased previously from 7.1.  She has cut out sugar from her diet and is  exercising regularly in the setting of these chronic illnesses that she has.  Today, her A1c is 6.2.  She has been working on her diet, however stopped exercising because of the cold weather.  She does have a plan in place as spring is coming around and the cold weather is going away.  Will recheck A1c at next visit.  Plan: - Glucose control with diet and exercise  Needs flu shot Deferred until the fall  Screening mammogram for breast cancer Mammogram ordered today for screening purposes   Patient discussed with Dr. Lennon Alstrom Jermiya Reichl, M.D. [place]  [facility], PGY-2 Pager: [phone] Date 04/26/2023 Time 9:49 AM

## 2023-04-26 NOTE — Patient Instructions (Addendum)
 Thank you so much for coming to the clinic today!   Your A1c is around the same as it was last December, keep up the good work.  I am glad you have a plan in place to keep up with your exercise.  Since you are doing well, we will see you back in about 6 months or sooner if something comes up.  If you have any questions please feel free to the call the clinic at anytime at [phone]. It was a pleasure seeing you!  Best, Dr. Thomasene Ripple

## 2023-04-26 NOTE — Assessment & Plan Note (Addendum)
 Patient's A1c in December 2024 was 6.2, decreased previously from 7.1.  She has cut out sugar from her diet and is exercising regularly in the setting of these chronic illnesses that she has.  Today, her A1c is 6.2.  She has been working on her diet, however stopped exercising because of the cold weather.  She does have a plan in place as spring is coming around and the cold weather is going away.  Will recheck A1c at next visit.  Plan: - Glucose control with diet and exercise

## 2023-04-26 NOTE — Assessment & Plan Note (Addendum)
 Patient presents for follow-up for chronic vertigo.  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  Orthostatics taken at that time were negative.  Since then, her symptoms have resolved.

## 2023-04-26 NOTE — Assessment & Plan Note (Signed)
 Mammogram ordered today for screening purposes

## 2023-04-27 NOTE — Progress Notes (Signed)
 Internal Medicine Clinic Attending  Case discussed with the resident at the time of the visit.  We reviewed the resident's history and exam and pertinent patient test results.  I agree with the assessment, diagnosis, and plan of care documented in the resident's note.

## 2023-04-27 NOTE — Addendum Note (Signed)
 Addended by: Dickie La on: 04/27/2023 01:38 PM   Modules accepted: Level of Service

## 2023-04-28 NOTE — Progress Notes (Signed)
 Office Visit Note  Patient: Heidi Chapman             Date of Birth: 12-02-1955           MRN: [id number]             PCP: Champ Mungo, DO Referring: Champ Mungo, DO Visit Date: 05/12/2023   Subjective:  Follow-up (Doing good)   Discussed the use of AI scribe software for clinical note transcription with the patient, who gave verbal consent to proceed.  History of Present Illness   Heidi Chapman is a 68 y.o. female here for follow up for seropositive RA on Humira 40 mg subcu q. 14 days.  She is currently on Humira, which effectively manages her joint symptoms, particularly in the hands. She experiences no morning stiffness but reports increased stiffness and pain in the evenings, especially during cold weather. Her inflammation markers were stable at her last visit.  She takes tramadol for pain and a muscle relaxer primarily for spinal issues, using them one to two times a day depending on the severity of her back flare-ups. Her back is the primary source of her pain rather than her hands. She describes a recent activity of planting flower seeds, which led to increased discomfort after completing about thirty pots. Her symptoms tend to flare up with cold weather, particularly before snow, and improve with warmer temperatures.  Her liver enzyme levels were slightly elevated, similar to when she was on Simponi Aria infusions two years ago.  She mentions a nodule on her finger that is bothersome but not painful, noting that a previous similar issue resolved on its own.  She has not experienced any significant illnesses in the past few months and has received her flu and pneumonia vaccinations. Her daughter and granddaughter had the flu, but she managed to avoid contracting it despite living in the same house.        Previous HPI 02/11/2023 Heidi Chapman is a 68 y.o. female here for follow up for seropositive RA on Humira 40 mg subcu q. 14 days.  She presents with increased  hand pain, particularly noticeable with the onset of cold weather. They describe the pain as inflammatory rather than swelling, and have increased their use of tramadol and muscle relaxers to manage the discomfort. They deny any associated redness or swelling.   They also report experiencing dizziness, which has led to a couple of blackouts and falls. They are currently under the care of their family doctor for this issue, and are awaiting an appointment with an ENT specialist for suspected positional vertigo.   She reports no issues with the injections. Her thumb trigger finger symptoms are improved without specific intervention for this.   They describe a radiating pain down their spine, which worsens with movement. They also report stiffness in their wrists, particularly the right one, and tenderness in their fingers.   The patient also reports popping in their knees and hips, which can sometimes be painful. They note that their joint stiffness and discomfort typically worsen around lunchtime, after they have been up and moving for a while, and continue to worsen by bedtime. They deny any morning stiffness or discomfort in their ankles or heels.        Previous HPI 11/11/2022 Heidi Chapman is a 68 y.o. female here for follow up for seropositive RA on Humira 40 mg subcu q. 14 days.  No significant flareup since her last visit.  She has  noticed some increase in joint pain and stiffness in her hands mostly in her shoulders for the past week associates this with the change in weather.  Otherwise feels Humira is working pretty well notices a slight increase in morning stiffness and possibly the swelling in finger joints for a few days between doses.   Previous HPI 08/11/2022 Heidi Chapman is a 68 y.o. female here for follow up for seropositive RA on Humira 40 mg subcu q. 14 days.  She had another interruption in treatment associated with change in insurance status so just restarted this in the  past month and has taken 2 doses so far.  The interruption of about 6 weeks duration and Humira treatment she experienced noticeable increase in hand pain and stiffness and swelling especially the several nodules on the fingers both right and left hand.  Bilateral shoulder pain also improved after resuming Humira.  Low back and hip pain has been a persistent problem so far not improved as much with resuming Humira.  This varies a lot from day-to-day sometimes more in the middle back and sometimes very low more around the low back and pelvis.  Hip pain is usually worse after driving or otherwise sitting still for a long duration.  Not having much complaint about her knees or legs she associates this more with fibromyalgia pain which is more stress triggered and has not been a recent flareup.   Previous HPI 05/07/2022 Heidi Chapman is a 68 y.o. female here for follow up for seropositive RA on Humira 40 mg subcu q. 14 days.  Since her last visit she has been on the medicine pretty consistently did have interruptus with some type of upper respiratory illness around 2 weeks ago.  She thinks this was similar to influenza but less severe.  Did not require antibiotics.  While taking the medicine without interruption sees a good benefit notices joint pain and stiffness worsening for about 3 to 4 days prior to each injection being due.  She is using a heat pack on her hands overnight also helps with the pain and stiffness.  Currently her back pain is the most limiting symptom she is following up with Dr. Alice Reichert for this on treatment with Flexeril and tramadol.  She had updated lumbar spine x-ray showing radiographic progression since 2019.  Currently working on a home exercise program for core strengthening during the past 2 weeks has not seen a significant improvement so far.   Previous HPI 02/03/22 Heidi Chapman is a 68 y.o. female here for follow up for seropositive RA on Humira 40 mg [place] q14 days. Since  resuming the Humira she notices improvement in her hand stiffness and swelling although still has some pain. Particularly nodules on the back of her joints hurt a lot with any minor bump or pressure.    Previous HPI 11/06/21 Heidi Chapman is a 68 y.o. female here for follow up for seropositive RA. She was taking Humira 40 mg [place] q14days with pretty stable symptoms but has missed the last 2 doses due to change in insurance and needing medicine pre-approval again. She has increased joint pain and stiffness especially in hands and wrists.   Previous HPI 08/06/2021 Heidi Chapman is a 68 y.o. female here for follow up for seropositive RA on Humira 40 mg [place] q14days started in March. She has generalized pain and fatigue related to fibromyalgia syndrome and DJD of her back, followed by Dr. Berline Chough currently on duloxetine and PRN  tramadol. Symptoms are doing somewhat better. She still feels daily stiffness but is not seeing much swelling and pain is decreased in elbow and hands and can tightly close fists. She has had several URI type symptoms intermittently feels like she gets sick when her granddaughter brings germs home from school, no antibiotics treatment or pneumonia.   Previous HPI 06/04/21 Heidi Chapman is a 67 y.o. female here for follow up for seropositive RA on Humira 40 mg [place] q14days started last month. Previous MTX treatment was not resumed due to abnormal baseline LFTs. She felt improvement after about 3 days on prednisone taper. Inflammation remains better but came back somewhat, but no large swollen nodules on the neck recently. Duloxetine is helping her chronic pain overall but still hurts with any activity more than an hour. She followed up with her neurology for the sleep apnea and fatigue still very fatigued limiting her activity.   Previous HPI 03/13/21 Heidi Chapman is a 68 y.o. female here for seropositive rheumatoid arthritis which has been treated with methotrexate 17.5 mg [place]  weekly and simponi aria infusion and taking folic acid 3 mg daily. In addition to RA she suffers pain with OA including lumbar spine DJD and fibromyalgia pain treated with naproxen 500 mg PRN and flexeril.  She reports joint pain started since many years ago particularly with progressive pain and stiffness and then lateral deviation worsening in MCP joints of both hands.  She also had chronic osteoarthritis particularly in her back with sciatica and back pain at multiple levels.  She finally went for evaluation of the hand pain and deformities since about 3 years ago was diagnosed with seropositive rheumatoid arthritis.  Initial treatments tried include leflunomide which was not tolerated and Enbrel which did not have clinical response.  Oral methotrexate was not tolerable she had some degree of nausea with the subcutaneous methotrexate as well but not as bad.  While on treatment she felt disease control was very good mostly just had the chronic OA type symptoms not much swelling and no progression of deformities. She has been a patient with Dr. Nickola Major needing to transfer care due to change in insurance status.  She is now off all of her medications since about a month ago and is noticing worsening joint pains again.  Particularly since 2 weeks ago she has much worse neck pain is developed lymph node swelling on the back of her skull and neck.  She went to the emergency department for this were no specific problems were found liver function test showed a mild elevation she was prescribed Keflex empirically for possible infection has not noticed any difference since taking this medicine. Besides joint pain and lymph node swelling she feels extremely fatigued overall.   DMARD Hx Enbrel primary nonresponder Simponi aria TOC Methotrexate [place] TOC Methotrexate PO GI intolerance Leflunomide not tolerated   Labs reviewed 02/2021 ESR 26 CBC wnl CMP AST 43 ALT 56 Alk phos 149   03/2019 TB neg HBV neg HCV neg    01/2019 RA 109 CCP >250   10/2017 HIV neg   Review of Systems  Constitutional:  Negative for fatigue.  HENT:  Negative for mouth sores and mouth dryness.   Eyes:  Positive for dryness.  Respiratory:  Positive for shortness of breath.   Cardiovascular:  Negative for chest pain and palpitations.  Gastrointestinal:  Negative for blood in stool, constipation and diarrhea.  Endocrine: Negative for increased urination.  Genitourinary:  Negative for involuntary urination.  Musculoskeletal:  Positive for joint pain, gait problem, joint pain, joint swelling, myalgias, muscle weakness, muscle tenderness and myalgias. Negative for morning stiffness.  Skin:  Positive for sensitivity to sunlight. Negative for color change, rash and hair loss.  Allergic/Immunologic: Positive for susceptible to infections.  Neurological:  Negative for dizziness and headaches.  Hematological:  Negative for swollen glands.  Psychiatric/Behavioral:  Negative for depressed mood and sleep disturbance. The patient is not nervous/anxious.     PMFS History:  Patient Active Problem List   Diagnosis Date Noted   Dizziness 02/01/2023   Needs flu shot 02/23/2022   Normocytic anemia 08/26/2021   Fibromyalgia 06/04/2021   Depression with anxiety 05/20/2021   Chronic bilateral back pain 03/19/2021   Other idiopathic scoliosis, thoracolumbar region 03/19/2021   Lumbar radiculopathy 03/19/2021   High risk medication use 03/13/2021   Body mass index (BMI) 30.0-30.9, adult 03/13/2021   Osteoarthritis 11/18/2020   Obstructive sleep apnea 08/29/2020   Chronic fatigue 06/28/2020   Screening mammogram for breast cancer 06/28/2020   Hyperlipidemia 06/28/2020   Prediabetes 06/28/2020   History of tobacco abuse 06/28/2020   Rheumatoid arthritis involving multiple sites with positive rheumatoid factor (HCC) 03/13/2019    Past Medical History:  Diagnosis Date   Absence of both cervix and uterus, acquired 12/14/2011    Acalculous cholecystitis 10/23/2017   Acquired deformity of right hand 03/13/2021   Acute left-sided low back pain without sciatica 03/13/2019   Anxiety    Anxiety 01/26/2019   Arthritis    Benign neoplasm of colon 12/14/2011   Diabetes (HCC)    Fatigue 06/28/2020   Fibromyalgia    GERD (gastroesophageal reflux disease)    Healthcare maintenance 12/06/2017   High risk medication use 03/13/2021   Lymphadenopathy 03/13/2021   Migraine    Neck pain 03/13/2021   Orthopnea 08/29/2020   Overweight 03/13/2021   Polyarthralgia 01/26/2019   Screening for colon cancer 06/28/2020   Screening mammogram for breast cancer 06/28/2020   Shortness of breath at rest 08/29/2020   Snoring 08/29/2020    Family History  Problem Relation Age of Onset   Cancer - Lung Mother    Brain cancer Mother    Colon polyps Father    Prostate cancer Father    Diverticulitis Father    Heart attack Father    Cancer Maternal Grandmother    Cancer Maternal Grandfather    Cancer Paternal Grandfather    Past Surgical History:  Procedure Laterality Date   CESAREAN SECTION     CHOLECYSTECTOMY N/A 10/24/2017   Procedure: LAPAROSCOPIC CHOLECYSTECTOMY WITH INTRAOPERATIVE CHOLANGIOGRAM;  Surgeon: Harriette Bouillon, MD;  Location: [facility] OR;  Service: General;  Laterality: N/A;   GALLBLADDER SURGERY     TUBAL LIGATION     Social History   Social History Narrative   Lives with youngest daughter and granddaughter   Right handed   Caffeine: 1 cup of coffee and 1 soda daily   Immunization History  Administered Date(s) Administered   Influenza,inj,Quad PF,6+ Mos 01/26/2019, 11/18/2020, 02/23/2022   PFIZER(Purple Top)SARS-COV-2 Vaccination 05/13/2019, 06/07/2019, 10/30/2019   PNEUMOCOCCAL CONJUGATE-20 08/13/2022     Objective: Vital Signs: BP 120/74 (BP Location: Right Arm, Patient Position: Sitting, Cuff Size: Normal)   Pulse 87   Resp 15   Ht 5\' 5"  (1.651 m)   Wt 183 lb (83 kg)   BMI 30.45 kg/m    Physical  Exam HENT:     Mouth/Throat:     Mouth: Mucous membranes are moist.  Pharynx: Oropharynx is clear.  Eyes:     Conjunctiva/sclera: Conjunctivae normal.  Cardiovascular:     Rate and Rhythm: Normal rate and regular rhythm.  Pulmonary:     Effort: Pulmonary effort is normal.     Breath sounds: Normal breath sounds.  Musculoskeletal:     Right lower leg: No edema.     Left lower leg: No edema.  Lymphadenopathy:     Cervical: No cervical adenopathy.  Skin:    General: Skin is warm and dry.     Findings: No rash.  Neurological:     Mental Status: She is alert.  Psychiatric:        Mood and Affect: Mood normal.      Musculoskeletal Exam:  Shoulders full ROM no tenderness or swelling Elbows full ROM no tenderness or swelling Wrists full ROM no tenderness or swelling Fingers full ROM no tenderness or swelling, nontender nodule on dorsal, medial side of left 2nd MCP Knees full ROM no tenderness or swelling Ankles full ROM no tenderness or swelling, nontender nodule on back of left achilles tendon MTPs full ROM no tenderness or swelling   Investigation: No additional findings.  Imaging: No results found.  Recent Labs: Lab Results  Component Value Date   WBC 6.2 05/12/2023   HGB 15.2 05/12/2023   PLT 228 05/12/2023   NA 140 05/12/2023   K 4.5 05/12/2023   CL 104 05/12/2023   CO2 28 05/12/2023   GLUCOSE 95 05/12/2023   BUN 14 05/12/2023   CREATININE 0.75 05/12/2023   BILITOT 1.2 05/12/2023   ALKPHOS 156 (H) 08/17/2021   AST 25 05/12/2023   ALT 25 05/12/2023   PROT 7.2 05/12/2023   ALBUMIN 3.1 (L) 08/17/2021   CALCIUM 9.7 05/12/2023   GFRAA 84 01/26/2019   QFTBGOLDPLUS NEGATIVE 02/11/2023    Speciality Comments: Leflunomide - intolerance; Enbrel - inadequate response; oral MTX - intolerable side effects; SImponi Aria infusions stopped in Jan 2023 and switched to Humira for convenience of self-injection; MTX stopped in Feb 2023 due to elevated LFTs Humira  started 04/17/21  Procedures:  No procedures performed Allergies: Azithromycin, Leflunomide, Peanut butter flavoring agent (non-screening), and Latex   Assessment / Plan:     Visit Diagnoses: Rheumatoid arthritis involving multiple sites with positive rheumatoid factor (HCC) - Increased use of Tramadol and muscle relaxants for symptom management. Symptoms controlled with Humira. Liver enzymes slightly elevated, similar to previous levels on Simponi Aria. Consider Actemra or Orencia if levels rise. [place] Am[place] has a small increased risk for COPD exacerbations, not contraindicated with mild COPD. - Rechecking CBC, CMP for medication monitoring suspicious for drug induced abnormal serology - Checking sed rate and - Consider switching to Actemra or Orencia if liver enzymes remain elevated. - Discuss Orencia's risks and benefits, including administration options and COPD exacerbation risk.  High risk medication use - Humira 40 mg [place] q14days  Trigger finger of left thumb  Fibromyalgia - Flexeril 10 mg twice a day  Vertigo - Recent episodes of dizziness and falls, suspected to be vertigo-related. Referred by PCP office to PT vestibular rehab  Nodule on Finger Nodule not causing significant pain. Surgical excision not urgent unless bothersome. May decrease with controlled inflammation, but definitive removal requires surgery. - Reassess nodule in June to determine need for surgical intervention.    Orders: Orders Placed This Encounter  Procedures   Sedimentation rate   CBC with Differential/Platelet   COMPLETE METABOLIC PANEL WITH GFR   Comprehensive metabolic panel  No orders of the defined types were placed in this encounter.    Follow-Up Instructions: Return in about 3 months (around 08/12/2023) for Ra on ADA f/u 3mos.   Fuller Plan, MD  Note - This record has been created using [employer].  Chart creation errors have been sought, but may not always  have been located.  Such creation errors do not reflect on  the standard of medical care.

## 2023-04-29 ENCOUNTER — Telehealth: Payer: Self-pay | Admitting: *Deleted

## 2023-04-29 NOTE — Telephone Encounter (Signed)
 Mammogram appointment July 17.2025 @ 2 pm arrive 1:45 pm at [facility] [phone], patient aware of the 75.00 no show fee if she do call cancel or reschedule.

## 2023-05-12 ENCOUNTER — Encounter: Payer: Self-pay | Admitting: Internal Medicine

## 2023-05-12 ENCOUNTER — Ambulatory Visit: Payer: Medicare HMO | Attending: Internal Medicine | Admitting: Internal Medicine

## 2023-05-12 VITALS — BP 120/74 | HR 87 | Resp 15 | Ht 65.0 in | Wt 183.0 lb

## 2023-05-12 DIAGNOSIS — M797 Fibromyalgia: Secondary | ICD-10-CM | POA: Diagnosis not present

## 2023-05-12 DIAGNOSIS — M65312 Trigger thumb, left thumb: Secondary | ICD-10-CM | POA: Diagnosis not present

## 2023-05-12 DIAGNOSIS — M0579 Rheumatoid arthritis with rheumatoid factor of multiple sites without organ or systems involvement: Secondary | ICD-10-CM

## 2023-05-12 DIAGNOSIS — R42 Dizziness and giddiness: Secondary | ICD-10-CM | POA: Diagnosis not present

## 2023-05-12 DIAGNOSIS — Z79899 Other long term (current) drug therapy: Secondary | ICD-10-CM | POA: Diagnosis not present

## 2023-05-13 LAB — COMPREHENSIVE METABOLIC PANEL WITH GFR
AG Ratio: 1.6 (calc) (ref 1.0–2.5)
ALT: 25 U/L (ref 6–29)
AST: 25 U/L (ref 10–35)
Albumin: 4.4 g/dL (ref 3.6–5.1)
Alkaline phosphatase (APISO): 153 U/L (ref 37–153)
BUN: 14 mg/dL (ref 7–25)
CO2: 28 mmol/L (ref 20–32)
Calcium: 9.7 mg/dL (ref 8.6–10.4)
Chloride: 104 mmol/L (ref 98–110)
Creat: 0.75 mg/dL (ref 0.50–1.05)
Globulin: 2.8 g/dL (ref 1.9–3.7)
Glucose, Bld: 95 mg/dL (ref 65–99)
Potassium: 4.5 mmol/L (ref 3.5–5.3)
Sodium: 140 mmol/L (ref 135–146)
Total Bilirubin: 1.2 mg/dL (ref 0.2–1.2)
Total Protein: 7.2 g/dL (ref 6.1–8.1)
eGFR: 88 mL/min/{1.73_m2} (ref >=60)

## 2023-05-13 LAB — CBC WITH DIFFERENTIAL/PLATELET
Absolute Lymphocytes: 2468 {cells}/uL (ref 850–3900)
Absolute Monocytes: 589 {cells}/uL (ref 200–950)
Basophils Absolute: 87 {cells}/uL (ref 0–200)
Basophils Relative: 1.4 %
Eosinophils Absolute: 149 {cells}/uL (ref 15–500)
Eosinophils Relative: 2.4 %
HCT: 45.6 % — ABNORMAL HIGH (ref 35.0–45.0)
Hemoglobin: 15.2 g/dL (ref 11.7–15.5)
MCH: 29 pg (ref 27.0–33.0)
MCHC: 33.3 g/dL (ref 32.0–36.0)
MCV: 87 fL (ref 80.0–100.0)
MPV: 11 fL (ref 7.5–12.5)
Monocytes Relative: 9.5 %
Neutro Abs: 2908 {cells}/uL (ref 1500–7800)
Neutrophils Relative %: 46.9 %
Platelets: 228 10*3/uL (ref 140–400)
RBC: 5.24 10*6/uL — ABNORMAL HIGH (ref 3.80–5.10)
RDW: 12.9 % (ref 11.0–15.0)
Total Lymphocyte: 39.8 %
WBC: 6.2 10*3/uL (ref 3.8–10.8)

## 2023-05-13 LAB — SEDIMENTATION RATE: Sed Rate: 28 mm/h (ref 0–30)

## 2023-06-10 ENCOUNTER — Other Ambulatory Visit: Payer: Self-pay | Admitting: Internal Medicine

## 2023-06-10 NOTE — Telephone Encounter (Signed)
 Medication sent to pharmacy

## 2023-06-14 ENCOUNTER — Other Ambulatory Visit: Payer: Self-pay | Admitting: Physical Medicine and Rehabilitation

## 2023-06-29 DIAGNOSIS — E669 Obesity, unspecified: Secondary | ICD-10-CM | POA: Diagnosis not present

## 2023-06-29 DIAGNOSIS — Z79620 Long term (current) use of immunosuppressive biologic: Secondary | ICD-10-CM | POA: Diagnosis not present

## 2023-06-29 DIAGNOSIS — E785 Hyperlipidemia, unspecified: Secondary | ICD-10-CM | POA: Diagnosis not present

## 2023-06-29 DIAGNOSIS — M62838 Other muscle spasm: Secondary | ICD-10-CM | POA: Diagnosis not present

## 2023-06-29 DIAGNOSIS — M069 Rheumatoid arthritis, unspecified: Secondary | ICD-10-CM | POA: Diagnosis not present

## 2023-06-29 DIAGNOSIS — H269 Unspecified cataract: Secondary | ICD-10-CM | POA: Diagnosis not present

## 2023-06-29 DIAGNOSIS — Z87891 Personal history of nicotine dependence: Secondary | ICD-10-CM | POA: Diagnosis not present

## 2023-06-29 DIAGNOSIS — Z6830 Body mass index (BMI) 30.0-30.9, adult: Secondary | ICD-10-CM | POA: Diagnosis not present

## 2023-06-29 DIAGNOSIS — Z91040 Latex allergy status: Secondary | ICD-10-CM | POA: Diagnosis not present

## 2023-06-29 DIAGNOSIS — N182 Chronic kidney disease, stage 2 (mild): Secondary | ICD-10-CM | POA: Diagnosis not present

## 2023-06-29 DIAGNOSIS — Z91018 Allergy to other foods: Secondary | ICD-10-CM | POA: Diagnosis not present

## 2023-06-29 DIAGNOSIS — Z809 Family history of malignant neoplasm, unspecified: Secondary | ICD-10-CM | POA: Diagnosis not present

## 2023-07-30 NOTE — Progress Notes (Signed)
 Office Visit Note  Patient: Heidi Chapman             Date of Birth: Sep 24, 1955           MRN: [id number]             PCP: Addie Perkins, DO Referring: Addie Perkins, DO Visit Date: 08/12/2023   Subjective:  Follow-up   Discussed the use of AI scribe software for clinical note transcription with the patient, who gave verbal consent to proceed.  History of Present Illness   Heidi Chapman is a 68 y.o. 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 14 days.   She has been experiencing increased nausea over the past three to four months, particularly in the afternoon and evening. She attributes this to her medication regimen and is attempting to manage it by adjusting the timing and food intake with her medications. Despite these efforts, the nausea persists. No particular correlation with her injectable medication.  Over the last four weeks, she has experienced flare-ups of pain in her hands, arms, shoulders, back, knees, and ankles. She describes the pain as widespread and has been taking more pain medication than usual, which she dislikes. She associates some of the pain with fibromyalgia in addition to her rheumatoid arthritis. No changes in activity noted. Symptoms may be exacerbated by weather changes.  She reports stiffness and aching in her hands, with no numbness or tingling. Her knees are described as noisy. She experiences typical summer swelling and has varicose veins, which she does not currently treat.  She notes a raised area on her forehead, which she is concerned about due to her long-term use of Humira  and her history of sun exposure. The area is not discolored and does not consider it urgent but prefers to have it checked due to her medication's potential side effects.      Previous HPI 05/12/2023 Heidi Chapman is a 68 y.o. 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 14 days.   She is currently on Humira , which  effectively manages her joint symptoms, particularly in the hands. She experiences no morning stiffness but reports increased stiffness and pain in the evenings, especially during cold weather. Her inflammation markers were stable at her last visit.   She takes tramadol  for pain and a muscle relaxer primarily for spinal issues, using them one to two times a day depending on the severity of her back flare-ups. Her back is the primary source of her pain rather than her hands. She describes a recent activity of planting flower seeds, which led to increased discomfort after completing about thirty pots. Her symptoms tend to flare up with cold weather, particularly before snow, and improve with warmer temperatures.   Her liver enzyme levels were slightly elevated, similar to when she was on Simponi Aria infusions two years ago.   She mentions a nodule on her finger that is bothersome but not painful, noting that a previous similar issue resolved on its own.   She has not experienced any significant illnesses in the past few months and has received her flu and pneumonia vaccinations. Her daughter and granddaughter had the flu, but she managed to avoid contracting it despite living in the same house.     Previous HPI 02/11/2023 Heidi Chapman is a 68 y.o. 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 14 days.  She presents with increased hand pain, particularly noticeable with the onset of cold weather. They describe the pain as inflammatory rather than swelling, and have increased their use of tramadol  and muscle relaxers to manage the discomfort. They deny any associated redness or swelling.   They also report experiencing dizziness, which has led to a couple of blackouts and falls. They are currently under the care of their family doctor for this issue, and are awaiting an appointment with an ENT specialist for suspected positional vertigo.   She reports no issues with the  injections. Her thumb trigger finger symptoms are improved without specific intervention for this.   They describe a radiating pain down their spine, which worsens with movement. They also report stiffness in their wrists, particularly the right one, and tenderness in their fingers.   The patient also reports popping in their knees and hips, which can sometimes be painful. They note that their joint stiffness and discomfort typically worsen around lunchtime, after they have been up and moving for a while, and continue to worsen by bedtime. They deny any morning stiffness or discomfort in their ankles or heels.    Previous HPI 11/11/2022 Heidi Chapman is a 68 y.o. 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 14 days.  No significant flareup since her last visit.  She has noticed some increase in joint pain and stiffness in her hands mostly in her shoulders for the past week associates this with the change in weather.  Otherwise feels Humira  is working pretty well notices a slight increase in morning stiffness and possibly the swelling in finger joints for a few days between doses.   Previous HPI 08/11/2022 Heidi Chapman is a 67 y.o. 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 14 days.  She had another interruption in treatment associated with change in insurance status so just restarted this in the past month and has taken 2 doses so far.  The interruption of about 6 weeks duration and Humira  treatment she experienced noticeable increase in hand pain and stiffness and swelling especially the several nodules on the fingers both right and left hand.  Bilateral shoulder pain also improved after resuming Humira .  Low back and hip pain has been a persistent problem so far not improved as much with resuming Humira .  This varies a lot from day-to-day sometimes more in the middle back and sometimes very low more around the low back and pelvis.  Hip pain  is usually worse after driving or otherwise sitting still for a long duration.  Not having much complaint about her knees or legs she associates this more with fibromyalgia pain which is more stress triggered and has not been a recent flareup.   Previous HPI 05/07/2022 Heidi Chapman is a 68 y.o. 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 14 days.  Since her last visit she has been on the medicine pretty consistently did have interruptus with some type of upper respiratory illness around 2 weeks ago.  She thinks this was similar to influenza but less severe.  Did not require antibiotics.  While taking the medicine without interruption sees a good benefit notices joint pain and stiffness worsening for about 3 to 4 days prior to each injection being due.  She is using a heat pack on her hands overnight also helps with the pain and stiffness.  Currently her  back pain is the most limiting symptom she is following up with Dr. Lovvorn for this on treatment with Flexeril  and tramadol .  She had updated lumbar spine x-ray showing radiographic progression since 2019.  Currently working on a home exercise program for core strengthening during the past 2 weeks has not seen a significant improvement so far.   Previous HPI 02/03/22 Heidi Chapman is a 68 y.o. female here for follow up for seropositive RA on Humira  40 mg [place] q14 days. Since resuming the Humira  she notices improvement in her hand stiffness and swelling although still has some pain. Particularly nodules on the back of her joints hurt a lot with any minor bump or pressure.    Previous HPI 11/06/21 Heidi Chapman is a 68 y.o. female here for follow up for seropositive RA. She was taking Humira  40 mg [place] q14days with pretty stable symptoms but has missed the last 2 doses due to change in insurance and needing medicine pre-approval again. She has increased joint pain and stiffness especially in hands and wrists.   Previous  HPI 08/06/2021 Heidi Chapman is a 68 y.o. female here for follow up for seropositive RA on Humira  40 mg [place] q14days started in March. She has generalized pain and fatigue related to fibromyalgia syndrome and DJD of her back, followed by Dr. Lovorn currently on duloxetine  and PRN tramadol . Symptoms are doing somewhat better. She still feels daily stiffness but is not seeing much swelling and pain is decreased in elbow and hands and can tightly close fists. She has had several URI type symptoms intermittently feels like she gets sick when her granddaughter brings germs home from school, no antibiotics treatment or pneumonia.   Previous HPI 06/04/21 Heidi Chapman is a 68 y.o. female here for follow up for seropositive RA on Humira  40 mg [place] q14days started last month. Previous MTX treatment was not resumed due to abnormal baseline LFTs. She felt improvement after about 3 days on prednisone  taper. Inflammation remains better but came back somewhat, but no large swollen nodules on the neck recently. Duloxetine  is helping her chronic pain overall but still hurts with any activity more than an hour. She followed up with her neurology for the sleep apnea and fatigue still very fatigued limiting her activity.   Previous HPI 03/13/21 Heidi Italiano Hilburn is a 68 y.o. female here for seropositive rheumatoid arthritis which has been treated with methotrexate 17.5 mg [place] weekly and simponi aria infusion and taking folic acid 3 mg daily. In addition to RA she suffers pain with OA including lumbar spine DJD and fibromyalgia pain treated with naproxen 500 mg PRN and flexeril .  She reports joint pain started since many years ago particularly with progressive pain and stiffness and then lateral deviation worsening in MCP joints of both hands.  She also had chronic osteoarthritis particularly in her back with sciatica and back pain at multiple levels.  She finally went for evaluation of the hand pain and deformities since about  3 years ago was diagnosed with seropositive rheumatoid arthritis.  Initial treatments tried include leflunomide which was not tolerated and Enbrel which did not have clinical response.  Oral methotrexate was not tolerable she had some degree of nausea with the subcutaneous methotrexate as well but not as bad.  While on treatment she felt disease control was very good mostly just had the chronic OA type symptoms not much swelling and no progression of deformities. She has been a patient with Dr. Ishmael  needing to transfer care due to change in insurance status.  She is now off all of her medications since about a month ago and is noticing worsening joint pains again.  Particularly since 2 weeks ago she has much worse neck pain is developed lymph node swelling on the back of her skull and neck.  She went to the emergency department for this were no specific problems were found liver function test showed a mild elevation she was prescribed Keflex  empirically for possible infection has not noticed any difference since taking this medicine. Besides joint pain and lymph node swelling she feels extremely fatigued overall.   DMARD Hx Enbrel primary nonresponder Simponi aria TOC Methotrexate [place] TOC Methotrexate PO GI intolerance Leflunomide not tolerated   Labs reviewed 02/2021 ESR 26 CBC wnl CMP AST 43 ALT 56 Alk phos 149   03/2019 TB neg HBV neg HCV neg   01/2019 RA 109 CCP >250   10/2017 HIV neg   Review of Systems  Constitutional:  Positive for fatigue.  HENT:  Positive for mouth dryness. Negative for mouth sores.   Eyes:  Positive for dryness.  Respiratory:  Positive for shortness of breath.   Cardiovascular:  Positive for palpitations. Negative for chest pain.  Gastrointestinal:  Negative for blood in stool, constipation and diarrhea.  Endocrine: Negative for increased urination.  Genitourinary:  Negative for involuntary urination.  Musculoskeletal:  Positive for joint pain, gait  problem, joint pain, joint swelling, myalgias, muscle weakness, morning stiffness, muscle tenderness and myalgias.  Skin:  Positive for sensitivity to sunlight. Negative for color change, rash and hair loss.  Allergic/Immunologic: Negative for susceptible to infections.  Neurological:  Positive for dizziness. Negative for headaches.  Hematological:  Negative for swollen glands.  Psychiatric/Behavioral:  Positive for sleep disturbance. Negative for depressed mood. The patient is nervous/anxious.     PMFS History:  Patient Active Problem List   Diagnosis Date Noted   Dizziness 02/01/2023   Needs flu shot 02/23/2022   Normocytic anemia 08/26/2021   Fibromyalgia 06/04/2021   Depression with anxiety 05/20/2021   Chronic bilateral back pain 03/19/2021   Other idiopathic scoliosis, thoracolumbar region 03/19/2021   Lumbar radiculopathy 03/19/2021   High risk medication use 03/13/2021   Body mass index (BMI) 30.0-30.9, adult 03/13/2021   Osteoarthritis 11/18/2020   Obstructive sleep apnea 08/29/2020   Chronic fatigue 06/28/2020   Screening mammogram for breast cancer 06/28/2020   Hyperlipidemia 06/28/2020   Prediabetes 06/28/2020   History of tobacco abuse 06/28/2020   Rheumatoid arthritis involving multiple sites with positive rheumatoid factor (HCC) 03/13/2019    Past Medical History:  Diagnosis Date   Absence of both cervix and uterus, acquired 12/14/2011   Acalculous cholecystitis 10/23/2017   Acquired deformity of right hand 03/13/2021   Acute left-sided low back pain without sciatica 03/13/2019   Anxiety    Anxiety 01/26/2019   Arthritis    Benign neoplasm of colon 12/14/2011   Diabetes (HCC)    Fatigue 06/28/2020   Fibromyalgia    GERD (gastroesophageal reflux disease)    Healthcare maintenance 12/06/2017   High risk medication use 03/13/2021   Lymphadenopathy 03/13/2021   Migraine    Neck pain 03/13/2021   Orthopnea 08/29/2020   Overweight 03/13/2021    Polyarthralgia 01/26/2019   Screening for colon cancer 06/28/2020   Screening mammogram for breast cancer 06/28/2020   Shortness of breath at rest 08/29/2020   Snoring 08/29/2020    Family History  Problem Relation Age of Onset  Cancer - Lung Mother    Brain cancer Mother    Colon polyps Father    Prostate cancer Father    Diverticulitis Father    Heart attack Father    Cancer Maternal Grandmother    Cancer Maternal Grandfather    Cancer Paternal Grandfather    Past Surgical History:  Procedure Laterality Date   CESAREAN SECTION     CHOLECYSTECTOMY N/A 10/24/2017   Procedure: LAPAROSCOPIC CHOLECYSTECTOMY WITH INTRAOPERATIVE CHOLANGIOGRAM;  Surgeon: Vanderbilt Ned, MD;  Location: [facility] OR;  Service: General;  Laterality: N/A;   GALLBLADDER SURGERY     TUBAL LIGATION     Social History   Social History Narrative   Lives with youngest daughter and granddaughter   Right handed   Caffeine: 1 cup of coffee and 1 soda daily   Immunization History  Administered Date(s) Administered   Influenza,inj,Quad PF,6+ Mos 01/26/2019, 11/18/2020, 02/23/2022   PFIZER(Purple Top)SARS-COV-2 Vaccination 05/13/2019, 06/07/2019, 10/30/2019   PNEUMOCOCCAL CONJUGATE-20 08/13/2022     Objective: Vital Signs: BP 121/78 (BP Location: Left Arm, Patient Position: Sitting, Cuff Size: Normal)   Pulse 96   Resp 16   Ht 5' 5 (1.651 m)   Wt 184 lb (83.5 kg)   BMI 30.62 kg/m    Physical Exam Constitutional:      Appearance: She is obese.   Eyes:     Conjunctiva/sclera: Conjunctivae normal.    Cardiovascular:     Rate and Rhythm: Normal rate and regular rhythm.  Pulmonary:     Effort: Pulmonary effort is normal.     Breath sounds: Normal breath sounds.   Musculoskeletal:     Right lower leg: No edema.     Left lower leg: No edema.  Lymphadenopathy:     Cervical: No cervical adenopathy.   Skin:    General: Skin is warm and dry.     Findings: No rash.     Comments: Superficial varicose  veins on medial sides of legs around knee level   Neurological:     Mental Status: She is alert.   Psychiatric:        Mood and Affect: Mood normal.      Musculoskeletal Exam:  Shoulders full ROM, very stiff and pain/tenderness along anterior shoulders extending down onto elbows extensor surfaces Elbows full ROM no tenderness or swelling Wrists full ROM no tenderness or swelling Fingers full ROM no tenderness or swelling, nontender nodule on dorsal, medial side of left 2nd MCP Knees full ROM no tenderness or swelling, bilateral patellofemoral crepitus Ankles full ROM no tenderness or swelling, nontender nodule on back of left achilles tendon MTPs full ROM no tenderness or swelling    Investigation: No additional findings.  Imaging: No results found.  Recent Labs: Lab Results  Component Value Date   WBC 6.2 05/12/2023   HGB 15.2 05/12/2023   PLT 228 05/12/2023   NA 140 05/12/2023   K 4.5 05/12/2023   CL 104 05/12/2023   CO2 28 05/12/2023   GLUCOSE 95 05/12/2023   BUN 14 05/12/2023   CREATININE 0.75 05/12/2023   BILITOT 1.2 05/12/2023   ALKPHOS 156 (H) 08/17/2021   AST 25 05/12/2023   ALT 25 05/12/2023   PROT 7.2 05/12/2023   ALBUMIN 3.1 (L) 08/17/2021   CALCIUM  9.7 05/12/2023   GFRAA 84 01/26/2019   QFTBGOLDPLUS NEGATIVE 02/11/2023    Speciality Comments: Leflunomide - intolerance; Enbrel - inadequate response; oral MTX - intolerable side effects; SImponi Aria infusions stopped in Jan 2023 and  switched to Humira  for convenience of self-injection; MTX stopped in Feb 2023 due to elevated LFTs Humira  started 04/17/21  Procedures:  No procedures performed Allergies: Azithromycin , Leflunomide, Peanut butter flavoring agent (non-screening), and Latex   Assessment / Plan:     Visit Diagnoses: Rheumatoid arthritis involving multiple sites with positive rheumatoid factor (HCC) - Increased use of Tramadol  and muscle relaxants for symptom management. - Plan: Sedimentation  rate Experiencing flares affecting multiple joints. No peripheral synovitis noted on exam today. Widespread pain increase and alon b/l upper arms not localized to joints, likey exacerbated more by fibromyalgia and OA related symptoms based on current exam. - Checking sed rate for disease activity monitoring - Plan to continue Humira  40 mg subcu q. 14 days  High risk medication use - Humira  40 mg [place] q14days - Plan: CBC with Differential/Platelet, Comprehensive metabolic panel with GFR Tolerating Humira  okay with no local or injection related drug reactions.  No serious interval infections. - Checking CBC and CMP for medication monitoring  Rheumatoid arthritis with flares  Fibromyalgia - Flexeril  10 mg twice a day Contributing to overall pain and discomfort during rheumatoid arthritis flares. Benefit with muscle relaxer Rx through PM&R office.  Varicose veins Condition not severe enough for intervention.  Raised area on forehead Possibly related to long-term Humira  use and sun exposure. Referral to dermatology recommended due to potential cancer risk and consider annual screening f/u. - Refer to dermatology for evaluation of skin lesion.        Orders: Orders Placed This Encounter  Procedures   Sedimentation rate   CBC with Differential/Platelet   Comprehensive metabolic panel with GFR   No orders of the defined types were placed in this encounter.    Follow-Up Instructions: Return in about 3 months (around 11/12/2023) for RA/FMS on ADA f/u 3mos.   Lonni LELON Ester, MD  Note - This record has been created using [employer].  Chart creation errors have been sought, but may not always  have been located. Such creation errors do not reflect on  the standard of medical care.

## 2023-08-11 ENCOUNTER — Other Ambulatory Visit: Payer: Self-pay | Admitting: Internal Medicine

## 2023-08-11 NOTE — Telephone Encounter (Signed)
 Medication sent to pharmacy

## 2023-08-12 ENCOUNTER — Ambulatory Visit: Attending: Internal Medicine | Admitting: Internal Medicine

## 2023-08-12 ENCOUNTER — Encounter: Payer: Self-pay | Admitting: Internal Medicine

## 2023-08-12 ENCOUNTER — Telehealth: Payer: Self-pay | Admitting: *Deleted

## 2023-08-12 VITALS — BP 121/78 | HR 96 | Resp 16 | Ht 65.0 in | Wt 184.0 lb

## 2023-08-12 DIAGNOSIS — M797 Fibromyalgia: Secondary | ICD-10-CM | POA: Diagnosis not present

## 2023-08-12 DIAGNOSIS — M0579 Rheumatoid arthritis with rheumatoid factor of multiple sites without organ or systems involvement: Secondary | ICD-10-CM

## 2023-08-12 DIAGNOSIS — R42 Dizziness and giddiness: Secondary | ICD-10-CM

## 2023-08-12 DIAGNOSIS — M65312 Trigger thumb, left thumb: Secondary | ICD-10-CM

## 2023-08-12 DIAGNOSIS — Z79899 Other long term (current) drug therapy: Secondary | ICD-10-CM

## 2023-08-12 LAB — COMPREHENSIVE METABOLIC PANEL WITH GFR
AG Ratio: 1.5 (calc) (ref 1.0–2.5)
ALT: 59 U/L — ABNORMAL HIGH (ref 6–29)
AST: 58 U/L — ABNORMAL HIGH (ref 10–35)
Albumin: 4.3 g/dL (ref 3.6–5.1)
Alkaline phosphatase (APISO): 219 U/L — ABNORMAL HIGH (ref 37–153)
BUN: 17 mg/dL (ref 7–25)
CO2: 27 mmol/L (ref 20–32)
Calcium: 9.5 mg/dL (ref 8.6–10.4)
Chloride: 105 mmol/L (ref 98–110)
Creat: 0.82 mg/dL (ref 0.50–1.05)
Globulin: 2.9 g/dL (ref 1.9–3.7)
Glucose, Bld: 87 mg/dL (ref 65–99)
Potassium: 4.6 mmol/L (ref 3.5–5.3)
Sodium: 140 mmol/L (ref 135–146)
Total Bilirubin: 1.2 mg/dL (ref 0.2–1.2)
Total Protein: 7.2 g/dL (ref 6.1–8.1)
eGFR: 79 mL/min/{1.73_m2} (ref >=60)

## 2023-08-12 LAB — CBC WITH DIFFERENTIAL/PLATELET
Absolute Lymphocytes: 2047 {cells}/uL (ref 850–3900)
Absolute Monocytes: 649 {cells}/uL (ref 200–950)
Basophils Absolute: 71 {cells}/uL (ref 0–200)
Basophils Relative: 1.2 %
Eosinophils Absolute: 201 {cells}/uL (ref 15–500)
Eosinophils Relative: 3.4 %
HCT: 44.2 % (ref 35.0–45.0)
Hemoglobin: 14.4 g/dL (ref 11.7–15.5)
MCH: 29 pg (ref 27.0–33.0)
MCHC: 32.6 g/dL (ref 32.0–36.0)
MCV: 88.9 fL (ref 80.0–100.0)
MPV: 12.8 fL — ABNORMAL HIGH (ref 7.5–12.5)
Monocytes Relative: 11 %
Neutro Abs: 2932 {cells}/uL (ref 1500–7800)
Neutrophils Relative %: 49.7 %
Platelets: 181 10*3/uL (ref 140–400)
RBC: 4.97 10*6/uL (ref 3.80–5.10)
RDW: 13.1 % (ref 11.0–15.0)
Total Lymphocyte: 34.7 %
WBC: 5.9 10*3/uL (ref 3.8–10.8)

## 2023-08-12 LAB — SEDIMENTATION RATE: Sed Rate: 39 mm/h — ABNORMAL HIGH (ref 0–30)

## 2023-08-12 NOTE — Telephone Encounter (Signed)
 Outcome  Approved today by [facility] NCPDP [id number]  Your request has been approved  Effective Date: 02/17/2023  Authorization Expiration Date: 11/10/2023

## 2023-08-12 NOTE — Telephone Encounter (Signed)
 Prior auth for cyclobenzaprine  sent to [facility] via CoverMyMeds. Arianne Czerwinski (Key: [id number]) PA Case ID #: [id number] Rx #: [id number] Status:sent

## 2023-08-16 ENCOUNTER — Ambulatory Visit: Payer: Self-pay | Admitting: Internal Medicine

## 2023-08-16 ENCOUNTER — Other Ambulatory Visit: Payer: Self-pay

## 2023-08-16 DIAGNOSIS — Z79899 Other long term (current) drug therapy: Secondary | ICD-10-CM

## 2023-08-16 DIAGNOSIS — M0579 Rheumatoid arthritis with rheumatoid factor of multiple sites without organ or systems involvement: Secondary | ICD-10-CM

## 2023-08-16 MED ORDER — HUMIRA (2 PEN) 40 MG/0.4ML ~~LOC~~ AJKT: AUTO-INJECTOR | SUBCUTANEOUS | 0 refills | Status: DC

## 2023-08-16 MED ORDER — PREDNISONE 10 MG PO TABS: ORAL_TABLET | ORAL | 0 refills | Status: AC

## 2023-08-16 NOTE — Addendum Note (Signed)
 Addended by: JEANNETTA LONNI ORN on: 08/16/2023 03:54 PM   Modules accepted: Orders

## 2023-08-16 NOTE — Progress Notes (Signed)
 Sed rate 39 is mildly elevated indicating some increase in inflammation compared to last time.  Along with the pain and stiffness in multiple areas this could indicate a flareup I will send in a short prescription for oral prednisone  she can take as needed or if the symptoms are improving on their own can just monitor for now.  Blood counts are normal.  Her kidney function is unchanged the liver enzyme tests are mildly elevated again not quite twice normal about the same as they were 6 months ago.

## 2023-08-16 NOTE — Telephone Encounter (Signed)
 Refill request received via fax from [facility] for Humira   Last Fill: 02/01/2023  Labs: 08/12/2023 Alkaline Phosphatase 219 AST 58 ALT 59 MPV 12.8  TB Gold: 02/11/2023 Negative   Next Visit: 11/15/2023  Last Visit: 08/12/2023  IK:Myzlfjunpi arthritis involving multiple sites with positive rheumatoid factor (HCC)   Current Dose per office note 08/12/2023: Humira  40 mg subcu q. 14 days   Okay to refill Humira ?

## 2023-09-02 ENCOUNTER — Ambulatory Visit

## 2023-09-02 ENCOUNTER — Other Ambulatory Visit: Payer: Self-pay | Admitting: *Deleted

## 2023-09-02 ENCOUNTER — Telehealth: Payer: Self-pay | Admitting: Internal Medicine

## 2023-09-02 ENCOUNTER — Ambulatory Visit
Admission: RE | Admit: 2023-09-02 | Discharge: 2023-09-02 | Disposition: A | Discharge status: Home / Self Care | Source: Ambulatory Visit | Attending: Internal Medicine

## 2023-09-02 DIAGNOSIS — Z1231 Encounter for screening mammogram for malignant neoplasm of breast: Secondary | ICD-10-CM

## 2023-09-02 DIAGNOSIS — Z79899 Other long term (current) drug therapy: Secondary | ICD-10-CM

## 2023-09-02 NOTE — Telephone Encounter (Signed)
 Patient contacted the office requesting a referral to [facility] health dermatology .  Patient request referral for mole check.  Patient's preferred area for referral is: [facility]

## 2023-10-08 ENCOUNTER — Encounter: Payer: Self-pay | Admitting: Physical Medicine and Rehabilitation

## 2023-10-08 ENCOUNTER — Encounter: Payer: Medicare HMO | Attending: Physical Medicine and Rehabilitation | Admitting: Physical Medicine and Rehabilitation

## 2023-10-08 VITALS — BP 127/84 | HR 89 | Ht 65.0 in | Wt 191.0 lb

## 2023-10-08 DIAGNOSIS — M545 Low back pain, unspecified: Secondary | ICD-10-CM | POA: Insufficient documentation

## 2023-10-08 DIAGNOSIS — M0579 Rheumatoid arthritis with rheumatoid factor of multiple sites without organ or systems involvement: Secondary | ICD-10-CM | POA: Insufficient documentation

## 2023-10-08 DIAGNOSIS — R5382 Chronic fatigue, unspecified: Secondary | ICD-10-CM | POA: Diagnosis not present

## 2023-10-08 DIAGNOSIS — M797 Fibromyalgia: Secondary | ICD-10-CM | POA: Insufficient documentation

## 2023-10-08 DIAGNOSIS — G8929 Other chronic pain: Secondary | ICD-10-CM | POA: Insufficient documentation

## 2023-10-08 DIAGNOSIS — M5416 Radiculopathy, lumbar region: Secondary | ICD-10-CM | POA: Insufficient documentation

## 2023-10-08 MED ORDER — TRAMADOL HCL 50 MG PO TABS: 50.0000 mg | ORAL_TABLET | Freq: Four times a day (QID) | ORAL | 1 refills | Status: AC | PRN

## 2023-10-08 MED ORDER — PROMETHAZINE HCL 12.5 MG PO TABS: ORAL_TABLET | ORAL | 1 refills | Status: AC

## 2023-10-08 MED ORDER — CYCLOBENZAPRINE HCL 10 MG PO TABS: 10.0000 mg | ORAL_TABLET | Freq: Three times a day (TID) | ORAL | 1 refills | Status: AC | PRN

## 2023-10-08 MED ORDER — AMANTADINE HCL 100 MG PO CAPS: 100.0000 mg | ORAL_CAPSULE | Freq: Every day | ORAL | 1 refills | Status: AC

## 2023-10-08 NOTE — Progress Notes (Signed)
 Subjective:    Patient ID: Heidi Chapman, female    DOB: 25-May-1955, 68 y.o.   MRN: [id number]  HPI   Pt is a 68 yr old female with hx of RA- hands, elbows, shoulders and neck-  DJD of entire back; borderline diabetes; GERD; no HTN. Also has scoliosis 15-20 degrees thoracolumbar and lumbar radiculopathy based on xray results. Also has Fibromyalgia- Dx'd with new DM2 Here for f/u on RA and FMS.   Doing OK Cycle of pain - getting to Rheum in relation to Humira  injections- starts day 12 and goes til day 15-16- - so every 2 weeks, flaring for 3-4 days-    Has taken no more than Tramadol  3x/day and with Flexeril  and Ibuprofen- seems to make it bearable.   Needs to  get out of home last few days-  when takes 2 tramadol - gets groggy and wants to sleep- so doesn't want to- so takes 50 mg/1 tab.   Trying to see if needs another injection cycle- took 5 tramadol  that day-     Doing well otherwise.  Doing chair yoga- just started 7 minutes/day-  and moving up to 15 minutes 1 minute at a time- wants to walk as well, but building up to these things. Chair yoga has been fun and no impact- feels more limber and less tight/knotted up afterwards.    Moving amantadine  to afternoon- working better- no let down/worn off- and gets tired as evening goes on and falling asleep well. Helping energy more.   Hands and back are hurting so bad- so stiff-  Couldn't make fist- did Humira  injection yesterday- already relieving it.     Pain Inventory Average Pain 4 Pain Right Now 5 My pain is constant, sharp, stabbing, and aching  In the last 24 hours, has pain interfered with the following? General activity 5 Relation with others 2 Enjoyment of life 2 What TIME of day is your pain at its worst? daytime and evening Sleep (in general) Fair  Pain is worse with: walking, bending, sitting, standing, and some activites Pain improves with: rest, heat/ice, therapy/exercise, pacing activities, medication,  and injections Relief from Meds: 6  Family History  Problem Relation Age of Onset   Cancer - Lung Mother    Brain cancer Mother    Colon polyps Father    Prostate cancer Father    Diverticulitis Father    Heart attack Father    Cancer Maternal Grandmother    Cancer Maternal Grandfather    Cancer Paternal Grandfather    Social History   Socioeconomic History   Marital status: Widowed    Spouse name: Not on file   Number of children: Not on file   Years of education: Not on file   Highest education level: Not on file  Occupational History   Not on file  Tobacco Use   Smoking status: Former    Current packs/day: 0.00    Average packs/day: 0.5 packs/day for 40.0 years (20.0 ttl pk-yrs)    Types: Cigarettes    Start date: 02/14/1981    Quit date: 02/14/2021    Years since quitting: 2.6    Passive exposure: Past   Smokeless tobacco: Never  Vaping Use   Vaping status: Never Used  Substance and Sexual Activity   Alcohol use: Yes    Comment: 1-3 drinks annually   Drug use: Never   Sexual activity: Not on file  Other Topics Concern   Not on file  Social History Narrative  Lives with youngest daughter and granddaughter   Right handed   Caffeine: 1 cup of coffee and 1 soda daily   Social Drivers of Health   Financial Resource Strain: Low Risk  (02/01/2023)   Overall Financial Resource Strain (CARDIA)    Difficulty of Paying Living Expenses: Not hard at all  Food Insecurity: No Food Insecurity (02/01/2023)   Hunger Vital Sign    Worried About Running Out of Food in the Last Year: Never true    Ran Out of Food in the Last Year: Never true  Transportation Needs: No Transportation Needs (02/01/2023)   PRAPARE - Administrator, Civil Service (Medical): No    Lack of Transportation (Non-Medical): No  Physical Activity: Insufficiently Active (02/01/2023)   Exercise Vital Sign    Days of Exercise per Week: 2 days    Minutes of Exercise per Session: 40 min   Stress: No Stress Concern Present (02/01/2023)   [employer] of Occupational Health - Occupational Stress Questionnaire    Feeling of Stress : Not at all  Social Connections: Moderately Isolated (02/01/2023)   Social Connection and Isolation Panel    Frequency of Communication with Friends and Family: More than three times a week    Frequency of Social Gatherings with Friends and Family: More than three times a week    Attends Religious Services: More than 4 times per year    Active Member of [employer] or Organizations: No    Attends Banker Meetings: Never    Marital Status: Widowed   Past Surgical History:  Procedure Laterality Date   CESAREAN SECTION     CHOLECYSTECTOMY N/A 10/24/2017   Procedure: LAPAROSCOPIC CHOLECYSTECTOMY WITH INTRAOPERATIVE CHOLANGIOGRAM;  Surgeon: Vanderbilt Ned, MD;  Location: [facility] OR;  Service: General;  Laterality: N/A;   GALLBLADDER SURGERY     TUBAL LIGATION     Past Surgical History:  Procedure Laterality Date   CESAREAN SECTION     CHOLECYSTECTOMY N/A 10/24/2017   Procedure: LAPAROSCOPIC CHOLECYSTECTOMY WITH INTRAOPERATIVE CHOLANGIOGRAM;  Surgeon: Vanderbilt Ned, MD;  Location: [facility] OR;  Service: General;  Laterality: N/A;   GALLBLADDER SURGERY     TUBAL LIGATION     Past Medical History:  Diagnosis Date   Absence of both cervix and uterus, acquired 12/14/2011   Acalculous cholecystitis 10/23/2017   Acquired deformity of right hand 03/13/2021   Acute left-sided low back pain without sciatica 03/13/2019   Anxiety    Anxiety 01/26/2019   Arthritis    Benign neoplasm of colon 12/14/2011   Diabetes (HCC)    Fatigue 06/28/2020   Fibromyalgia    GERD (gastroesophageal reflux disease)    Healthcare maintenance 12/06/2017   High risk medication use 03/13/2021   Lymphadenopathy 03/13/2021   Migraine    Neck pain 03/13/2021   Orthopnea 08/29/2020   Overweight 03/13/2021   Polyarthralgia 01/26/2019   Screening for colon cancer  06/28/2020   Screening mammogram for breast cancer 06/28/2020   Shortness of breath at rest 08/29/2020   Snoring 08/29/2020   BP 127/84   Pulse 89   Ht 5' 5 (1.651 m)   Wt 191 lb (86.6 kg)   SpO2 91%   BMI 31.78 kg/m   Opioid Risk Score:   Fall Risk Score:  `1  Depression screen [facility] 2/9     10/08/2023   10:46 AM 10/16/2022   11:08 AM 08/13/2022    2:43 PM 07/17/2022   10:11 AM 04/17/2022    1:36 PM  02/23/2022   10:40 AM 09/15/2021    9:27 AM  Depression screen PHQ 2/9  Decreased Interest 0 0 0 0 0  0  Down, Depressed, Hopeless 0 0 0 0 0 0 0  PHQ - 2 Score 0 0 0 0 0 0 0  Altered sleeping   0   3   Tired, decreased energy   3   3   Change in appetite   0   0   Feeling bad or failure about yourself    0   0   Trouble concentrating   3   3   Moving slowly or fidgety/restless   3   3   Suicidal thoughts   0   0   PHQ-9 Score   9   12   Difficult doing work/chores   Not difficult at all   Somewhat difficult      Review of Systems  Musculoskeletal:  Positive for back pain.       Bilateral hand pain  All other systems reviewed and are negative.      Objective:   Physical Exam        Assessment & Plan:   Pt is a 68 yr old female with hx of RA- hands, elbows, shoulders and neck-  DJD of entire back; borderline diabetes; GERD; no HTN. Also has scoliosis 15-20 degrees thoracolumbar and lumbar radiculopathy based on xray results. Also has Fibromyalgia- Dx'd with new DM2 Here for f/u on RA and FMS.   Last Urine Drug screen- 04/16/23- per clinic policy isn't due today.   2.  Try Tramadol  75 mg  so 1.5 tabs- to see if that helps the pain more. Has pill cutter!-  3.  Cont chair yoga- first day felt better- we discussed this and how to build up- her plan is great.    4.  Con't Flexeril - needs refills- sent in   5. Con't Amantadine  100 mg daily at noon- needs refills- sent in.   6.  F/U in 6 months - on RA, FMS, and chronic pain  I spent a total of  21  minutes on  total care today- >50% coordination of care- due to d/w pt about her RA issues, and pain flares as a result- and how to manage pain/options discussed.

## 2023-10-08 NOTE — Patient Instructions (Signed)
 Pt is a 68 yr old female with hx of RA- hands, elbows, shoulders and neck-  DJD of entire back; borderline diabetes; GERD; no HTN. Also has scoliosis 15-20 degrees thoracolumbar and lumbar radiculopathy based on xray results. Also has Fibromyalgia- Dx'd with new DM2 Here for f/u on RA and FMS.   Last Urine Drug screen- 04/16/23- per clinic policy isn't due today.   2.  Try Tramadol  75 mg  so 1.5 tabs- to see if that helps the pain more. Has pill cutter!-  3.  Cont chair yoga- first day felt better- we discussed this and how to build up- her plan is great.    4.  Con't Flexeril - needs refills- sent in   5. Con't Amantadine  100 mg daily at noon- needs refills- sent in.   6.  F/U in 6 months - on RA, FMS, and chronic pain

## 2023-10-19 ENCOUNTER — Ambulatory Visit: Payer: Self-pay | Admitting: Student

## 2023-10-19 ENCOUNTER — Telehealth: Payer: Self-pay

## 2023-10-19 VITALS — BP 129/80 | HR 89 | Temp 98.2°F | Ht 65.0 in | Wt 186.4 lb

## 2023-10-19 DIAGNOSIS — E1169 Type 2 diabetes mellitus with other specified complication: Secondary | ICD-10-CM

## 2023-10-19 DIAGNOSIS — G4733 Obstructive sleep apnea (adult) (pediatric): Secondary | ICD-10-CM

## 2023-10-19 DIAGNOSIS — Z7985 Long-term (current) use of injectable non-insulin antidiabetic drugs: Secondary | ICD-10-CM

## 2023-10-19 DIAGNOSIS — M797 Fibromyalgia: Secondary | ICD-10-CM | POA: Diagnosis not present

## 2023-10-19 DIAGNOSIS — F17201 Nicotine dependence, unspecified, in remission: Secondary | ICD-10-CM

## 2023-10-19 DIAGNOSIS — Z87891 Personal history of nicotine dependence: Secondary | ICD-10-CM

## 2023-10-19 DIAGNOSIS — R748 Abnormal levels of other serum enzymes: Secondary | ICD-10-CM

## 2023-10-19 DIAGNOSIS — M0579 Rheumatoid arthritis with rheumatoid factor of multiple sites without organ or systems involvement: Secondary | ICD-10-CM

## 2023-10-19 DIAGNOSIS — E119 Type 2 diabetes mellitus without complications: Secondary | ICD-10-CM

## 2023-10-19 DIAGNOSIS — E66811 Obesity, class 1: Secondary | ICD-10-CM

## 2023-10-19 DIAGNOSIS — Z6831 Body mass index (BMI) 31.0-31.9, adult: Secondary | ICD-10-CM | POA: Diagnosis not present

## 2023-10-19 DIAGNOSIS — R062 Wheezing: Secondary | ICD-10-CM | POA: Diagnosis not present

## 2023-10-19 DIAGNOSIS — E661 Drug-induced obesity: Secondary | ICD-10-CM

## 2023-10-19 LAB — GLUCOSE, CAPILLARY: Glucose-Capillary: 81 mg/dL (ref 70–99)

## 2023-10-19 LAB — POCT GLYCOSYLATED HEMOGLOBIN (HGB A1C): HbA1c, POC (controlled diabetic range): 6.5 % (ref 0.0–7.0)

## 2023-10-19 MED ORDER — ALBUTEROL SULFATE HFA 108 (90 BASE) MCG/ACT IN AERS: 2.0000 | INHALATION_SPRAY | Freq: Four times a day (QID) | RESPIRATORY_TRACT | 2 refills | Status: DC | PRN

## 2023-10-19 MED ORDER — MOUNJARO 2.5 MG/0.5ML ~~LOC~~ SOAJ: 2.5000 mg | SUBCUTANEOUS | 3 refills | Status: DC

## 2023-10-19 NOTE — Telephone Encounter (Signed)
 Heidi Chapman (Key: [id number]) Rx #: [id number] Mounjaro  2.5MG /0.5ML auto-injectors Form [employer] Electronic PA Form (2017 NCPDP) Created Sent to Plan Plan Response Submit Clinical Questions Determination Favorable Message from Plan Your request has been approved. Authorization Expiration Date: February 16, 2024.

## 2023-10-19 NOTE — Assessment & Plan Note (Signed)
 Discontinued its use wbecause she couldn't tolerate the mask. Has not tried nasal cushions. But patient's family members report that that she is not snoring and that she is sleeping through the night.  -If the snoring returns, or your blood pressure increases, not able to sleep through the night, I recommend we repeat the sleep titration to get back on cPAP machine with a different cushion

## 2023-10-19 NOTE — Progress Notes (Unsigned)
 Subjective:  CC: Chronic condition follow up  HPI:  [name] is a 68 y.o. female with a past medical history stated below and presents today for chronic condition follow up. Notably, she has RA, FMS, and chronic pain which are managed by Dr. Jeannetta and Lovorn, respectively, whom she sees regularly. Today, we discussed wheezing, diabetes  & weight management, recent lab work, and lung cancer screening. Please see problem based assessment and plan for additional details.  Past Medical History:  Diagnosis Date   Absence of both cervix and uterus, acquired 12/14/2011   Acalculous cholecystitis 10/23/2017   Acquired deformity of right hand 03/13/2021   Acute left-sided low back pain without sciatica 03/13/2019   Anxiety    Anxiety 01/26/2019   Arthritis    Benign neoplasm of colon 12/14/2011   Diabetes (HCC)    Fatigue 06/28/2020   Fibromyalgia    GERD (gastroesophageal reflux disease)    Healthcare maintenance 12/06/2017   High risk medication use 03/13/2021   Lymphadenopathy 03/13/2021   Migraine    Neck pain 03/13/2021   Orthopnea 08/29/2020   Overweight 03/13/2021   Polyarthralgia 01/26/2019   Screening for colon cancer 06/28/2020   Screening mammogram for breast cancer 06/28/2020   Shortness of breath at rest 08/29/2020   Snoring 08/29/2020    Current Outpatient Medications on File Prior to Visit  Medication Sig Dispense Refill   adalimumab  (HUMIRA , 2 PEN,) 40 MG/0.4ML pen INJECT 40 MG (0.4 ML) UNDER THE SKIN EVERY 14 DAYS 6 each 0   amantadine  (SYMMETREL ) 100 MG capsule Take 1 capsule (100 mg total) by mouth daily. 90 capsule 1   atorvastatin  (LIPITOR) 10 MG tablet Take 1 tablet by mouth once daily 60 tablet 0   Cholecalciferol (DIALYVITE VITAMIN D  5000 PO) Take by mouth.     cimetidine (TAGAMET) 200 MG tablet Take 200 mg by mouth daily as needed (Acid reflux).     cyclobenzaprine  (FLEXERIL ) 10 MG tablet Take 1 tablet (10 mg total) by mouth 3 (three) times  daily as needed for muscle spasms. 270 tablet 1   Multiple Vitamins-Minerals (MULTIVITAMIN WOMEN 50+) TABS Take 1 tablet by mouth daily.     promethazine  (PHENERGAN ) 12.5 MG tablet TAKE 1 TABLET BY MOUTH EVERY 6 HOURS AS NEEDED FOR NAUSEA FOR VOMITING 270 tablet 1   traMADol  (ULTRAM ) 50 MG tablet Take 1 tablet (50 mg total) by mouth every 6 (six) hours as needed. 360 tablet 1   No current facility-administered medications on file prior to visit.    Family History  Problem Relation Age of Onset   Cancer - Lung Mother    Brain cancer Mother    Colon polyps Father    Prostate cancer Father    Diverticulitis Father    Heart attack Father    Cancer Maternal Grandmother    Cancer Maternal Grandfather    Cancer Paternal Grandfather     Social History   Socioeconomic History   Marital status: Widowed    Spouse name: Not on file   Number of children: Not on file   Years of education: Not on file   Highest education level: Not on file  Occupational History   Not on file  Tobacco Use   Smoking status: Former    Current packs/day: 0.00    Average packs/day: 0.5 packs/day for 40.0 years (20.0 ttl pk-yrs)    Types: Cigarettes    Start date: 02/14/1981    Quit date: 02/14/2021  Years since quitting: 2.6    Passive exposure: Past   Smokeless tobacco: Never  Vaping Use   Vaping status: Never Used  Substance and Sexual Activity   Alcohol use: Yes    Comment: 1-3 drinks annually   Drug use: Never   Sexual activity: Not on file  Other Topics Concern   Not on file  Social History Narrative   Lives with youngest daughter and granddaughter   Right handed   Caffeine: 1 cup of coffee and 1 soda daily   Social Drivers of Health   Financial Resource Strain: Medium Risk (10/19/2023)   Overall Financial Resource Strain (CARDIA)    Difficulty of Paying Living Expenses: Somewhat hard  Food Insecurity: No Food Insecurity (10/19/2023)   Hunger Vital Sign    Worried About Running Out of  Food in the Last Year: Never true    Ran Out of Food in the Last Year: Never true  Transportation Needs: No Transportation Needs (10/19/2023)   PRAPARE - Administrator, Civil Service (Medical): No    Lack of Transportation (Non-Medical): No  Physical Activity: Insufficiently Active (10/19/2023)   Exercise Vital Sign    Days of Exercise per Week: 4 days    Minutes of Exercise per Session: 10 min  Stress: No Stress Concern Present (10/19/2023)   [employer] of Occupational Health - Occupational Stress Questionnaire    Feeling of Stress: Only a little  Social Connections: Socially Isolated (10/19/2023)   Social Connection and Isolation Panel    Frequency of Communication with Friends and Family: Twice a week    Frequency of Social Gatherings with Friends and Family: Once a week    Attends Religious Services: Never    Database administrator or Organizations: No    Attends Banker Meetings: Never    Marital Status: Widowed  Intimate Partner Violence: Not At Risk (10/19/2023)   Humiliation, Afraid, Rape, and Kick questionnaire    Fear of Current or Ex-Partner: No    Emotionally Abused: No    Physically Abused: No    Sexually Abused: No    Review of Systems: ROS negative except for what is noted on the assessment and plan.  Objective:   Vitals:   10/19/23 1000 10/19/23 1012  BP: (!) 141/84 129/80  Pulse: 78 89  Temp: 98.2 F (36.8 C)   TempSrc: Oral   SpO2: 95%   Weight: 186 lb 6.4 oz (84.6 kg)   Height: 5' 5 (1.651 m)     Physical Exam: Constitutional: well-appearing woman sitting in chair, in no acute distress HENT: normocephalic atraumatic, mucous membranes moist Eyes: conjunctiva non-erythematous Neck: supple Cardiovascular: regular rate and rhythm, no m/r/g Pulmonary/Chest: normal work of breathing on room air, lungs clear to auscultation bilaterally Abdominal: soft, non-tender, non-distended MSK: normal bulk and tone. Ulnar deviation and  subcutaneous nodules present on bilateral hands. Neurological: alert & oriented x 3 Skin: warm and dry Psych: Pleasant mood and affect       10/19/2023   10:00 AM  Depression screen PHQ 2/9  Decreased Interest 0  Down, Depressed, Hopeless 0  PHQ - 2 Score 0  Altered sleeping 2  Tired, decreased energy 2  Change in appetite 2  Feeling bad or failure about yourself  0  Trouble concentrating 3  Moving slowly or fidgety/restless 2  Suicidal thoughts 0  PHQ-9 Score 11  Difficult doing work/chores Somewhat difficult       10/19/2023   10:14 AM  01/26/2019   11:10 AM  GAD 7 : Generalized Anxiety Score  Nervous, Anxious, on Edge 2 3  Control/stop worrying 0 3  Worry too much - different things 1 3  Trouble relaxing 1 3  Restless 1 3  Easily annoyed or irritable 1 3  Afraid - awful might happen 0 3  Total GAD 7 Score 6 21  Anxiety Difficulty Somewhat difficult Not difficult at all     Assessment & Plan:   Obstructive sleep apnea Discontinued its use wbecause she couldn't tolerate the mask. Has not tried nasal cushions. But patient's family members report that that she is not snoring and that she is sleeping through the night.  -If the snoring returns, or your blood pressure increases, not able to sleep through the night, I recommend we repeat the sleep titration to get back on cPAP machine with a different cushion  Rheumatoid arthritis involving multiple sites with positive rheumatoid factor (HCC) Followed by Dr. Jeannetta. Q14 day Humira  injections. Stable. Her PHQ9 scores (11) are related to symptoms she experiences when injection is due. The fatigability, lack of sleep, appetite and mood changes coincide with this.  - Receives injections from theum office, self injects - q3 month check with Dr. Jeannetta  Type 2 diabetes mellitus with other specified complication [facility]) Lab Results  Component Value Date   HGBA1C 6.5 10/19/2023   HGBA1C 6.2 (A) 04/26/2023   HGBA1C 6.2 (A) 02/01/2023   Uptrending A1c. Has been working on lifestyle modifications. BMI 31. On statin. Patient with chronic inflammatory conditions with increase cardiovascular risk. 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 - Low dose CT chest ordered  Fibromyalgia Currently on Amantadine  and Tramadol . Continues to do chair yoga. Favorable response when Tramadol  when combined with Ibuprofen. Discussed long term implications of this therapy. She will discuss alternatives with Dr. Lovorn.  Class 1 drug-induced obesity with serious comorbidity and body mass index (BMI) of 31.0 to 31.9 in adult Discussed lifestyle modifications and the need for better control in the setting of DM, RA, FMS, and chronic pain syndrome.She has been working on diet and gentle movement. - Continue lifestyle modifications - Start Mounjaro  2.5 mg weekly   Wheezing None currently. But has noted increased wheezing intermittently with mild excertion. No COPD diagnosis in the past, recent URI. PFTs 3 years ago with mild obstructive disease. Will reorder PFTs and order albuterol  inhaler for pRN use.  - If worsening obstructive pattern concerning for COPD, will order tiotropium inhaler to lower risk of exacerbations in the future  Elevated liver enzymes Intermittent elevation of enzymes. With RA, high BMI, will order RUQ US  to rule out liver structural abnormalities    Return in about 6 months (around 04/17/2024) for Diabetes, chronic medical conditions.  Patient discussed with Dr. Lovie Hadassah Kristy Rosario, MD [facility]

## 2023-10-19 NOTE — Telephone Encounter (Signed)
 Prior Authorization for patient (Mounjaro  2.5MG /0.5ML auto-injectors) came through on cover my meds was submitted with last office notes awaiting approval or denial.  XZB:[id number]

## 2023-10-19 NOTE — Patient Instructions (Addendum)
 Thank you, [name] for allowing us  to provide your care today. Today we discussed   Diabetes- diet control. I recommend we think about GLP1-RA (like Ozemfor this and to help with weight management. Loweing your weight could help you with your FMS and chronic pain  Referrals: - CT scan of the chest for lung cancer screening - Pulmonary function tests   I have ordered the following labs for you:  Lab Orders         POC Hbg A1C       I will call if any are abnormal. All of your labs can be accessed through My Chart.   My Chart Access: [email or web address]  Please follow-up in: 6 months or sooner to discuss changes to your diabetes management    We look forward to seeing you next time. Please call our clinic at [phone] if you have any questions or concerns. The best time to call is Monday-Friday from 9am-4pm, but there is someone available 24/7. If after hours or the weekend, call the main hospital number and ask for the Internal Medicine Resident On-Call. If you need medication refills, please notify your pharmacy one week in advance and they will send us  a request.   Thank you for letting us  take part in your care. Wishing you the best!  Elnora Ip, MD 10/19/2023, 10:57 AM [facility]

## 2023-10-20 ENCOUNTER — Other Ambulatory Visit (HOSPITAL_COMMUNITY): Payer: Self-pay

## 2023-10-20 ENCOUNTER — Encounter: Payer: Self-pay | Admitting: Student

## 2023-10-20 ENCOUNTER — Telehealth: Payer: Self-pay | Admitting: *Deleted

## 2023-10-20 DIAGNOSIS — E8889 Other specified metabolic disorders: Secondary | ICD-10-CM | POA: Insufficient documentation

## 2023-10-20 DIAGNOSIS — R062 Wheezing: Secondary | ICD-10-CM | POA: Insufficient documentation

## 2023-10-20 DIAGNOSIS — E1169 Type 2 diabetes mellitus with other specified complication: Secondary | ICD-10-CM

## 2023-10-20 DIAGNOSIS — R748 Abnormal levels of other serum enzymes: Secondary | ICD-10-CM | POA: Insufficient documentation

## 2023-10-20 NOTE — Assessment & Plan Note (Addendum)
 None currently. But has noted increased wheezing intermittently with mild excertion. No COPD diagnosis in the past, recent URI. PFTs 3 years ago with mild obstructive disease. Will reorder PFTs and order albuterol  inhaler for pRN use.  - If worsening obstructive pattern concerning for COPD, will order tiotropium inhaler to lower risk of exacerbations in the future

## 2023-10-20 NOTE — Assessment & Plan Note (Signed)
 Discussed lifestyle modifications and the need for better control in the setting of DM, RA, FMS, and chronic pain syndrome.She has been working on diet and gentle movement. - Continue lifestyle modifications - Start Mounjaro  2.5 mg weekly

## 2023-10-20 NOTE — Assessment & Plan Note (Signed)
>>  ASSESSMENT AND PLAN FOR ELEVATED LIVER ENZYMES WRITTEN ON 10/20/2023  8:08 AM BY GOMEZ-CARABALLO, MARIA, MD  Intermittent elevation of enzymes. With RA, high BMI, will order RUQ US  to rule out liver structural abnormalities

## 2023-10-20 NOTE — Assessment & Plan Note (Signed)
 Lab Results  Component Value Date   HGBA1C 6.5 10/19/2023   HGBA1C 6.2 (A) 04/26/2023   HGBA1C 6.2 (A) 02/01/2023  Uptrending A1c. Has been working on lifestyle modifications. BMI 31. On statin. Patient with chronic inflammatory conditions with increase cardiovascular risk. - Will trial Mounjaro  2.5 mg weekly - Side effects discussed with patient - Patient to return to clinic to assess side effect profile and uptitration as tolerated - Urine ACR needed at next visit

## 2023-10-20 NOTE — Assessment & Plan Note (Addendum)
 Followed by Dr. Jeannetta. Q14 day Humira  injections. Stable. Her PHQ9 scores (11) are related to symptoms she experiences when injection is due. The fatigability, lack of sleep, appetite and mood changes coincide with this.  - Receives injections from [facility]um office, self injects - q3 month check with Dr. Jeannetta

## 2023-10-20 NOTE — Progress Notes (Signed)
 Internal Medicine Clinic Attending  Case discussed with the resident at the time of the visit.  We reviewed the resident's history and exam and pertinent patient test results.  I agree with the assessment, diagnosis, and plan of care documented in the resident's note.

## 2023-10-20 NOTE — Assessment & Plan Note (Signed)
 Currently on Amantadine  and Tramadol . Continues to do chair yoga. Favorable response when Tramadol  when combined with Ibuprofen. Discussed long term implications of this therapy. She will discuss alternatives with Dr. Lovorn.

## 2023-10-20 NOTE — Telephone Encounter (Signed)
 Call to Pharmacy.  Patient even with PA approval will have a $265 co pay. Will forward to PCP to see if another medication can be ordered for patient.  Copied from CRM #[id number]. Topic: Clinical - Prescription Issue >> Oct 20, 2023 10:36 AM Carmell SAUNDERS wrote: Reason for CRM: Pt was prescribed the tirzepatide  (MOUNJARO ) 2.5 MG/0.5ML Pen, but pt says that she is unable to pay the copay amount of $265. Pt wants to know if there is any alternative or medication assistance. Also wants to know if another medication is not available at a cheaper cost, will the follow up scheduled in October still be needed? [phone]

## 2023-10-20 NOTE — Assessment & Plan Note (Addendum)
 3 year in remission. - Low dose CT chest ordered

## 2023-10-20 NOTE — Assessment & Plan Note (Signed)
 Intermittent elevation of enzymes. With RA, high BMI, will order RUQ US  to rule out liver structural abnormalities

## 2023-10-21 MED ORDER — SEMAGLUTIDE(0.25 OR 0.5MG/DOS) 2 MG/3ML ~~LOC~~ SOPN: 0.2500 mg | PEN_INJECTOR | SUBCUTANEOUS | 3 refills | Status: DC

## 2023-10-21 NOTE — Telephone Encounter (Signed)
 Discontinued Mounjaro . Will attempt Semaglutide  0.25 mg weekly in hopes for better co-pay.

## 2023-10-22 ENCOUNTER — Telehealth: Payer: Self-pay

## 2023-10-22 ENCOUNTER — Ambulatory Visit
Admission: RE | Admit: 2023-10-22 | Discharge: 2023-10-22 | Disposition: A | Discharge status: Home / Self Care | Source: Ambulatory Visit | Attending: Internal Medicine | Admitting: Internal Medicine

## 2023-10-22 ENCOUNTER — Other Ambulatory Visit (HOSPITAL_COMMUNITY): Payer: Self-pay

## 2023-10-22 DIAGNOSIS — R748 Abnormal levels of other serum enzymes: Secondary | ICD-10-CM

## 2023-10-22 DIAGNOSIS — R945 Abnormal results of liver function studies: Secondary | ICD-10-CM | POA: Diagnosis not present

## 2023-10-22 NOTE — Progress Notes (Signed)
 Pharmacy Medication Assistance Program Note    11/03/2023  Patient ID: Heidi Chapman, female   DOB: [birth year], 68 y.o.   MRN: [id number]     10/22/2023  Outreach Medication [employer]  Manufacturer Medication One Novo Nordisk  [employer] [facility] Ozempic   Type of Radiographer, therapeutic Assistance  Date Application Sent to Patient 10/22/2023  Application Items Requested Application  Date Application Sent to Prescriber 10/27/2023  Name of Prescriber Jone Dauphin  Date Application Received From Patient 10/27/2023  Application Items Received From Patient Application  Date Application Received From Provider 10/28/2023  Date Application Submitted to Manufacturer 11/03/2023  Method Application Sent to Manufacturer Fax     New - submitted

## 2023-10-22 NOTE — Telephone Encounter (Signed)
 Prior Authorization for patient (Ozempic  (0.25 or 0.5 MG/DOSE) 2MG /3ML pen-injectors) came through on cover my meds was submitted awaiting approval or denial.  [id number]

## 2023-10-22 NOTE — Telephone Encounter (Signed)
 Heidi Chapman (Key: [id number]) Rx #: [id number] Ozempic  (0.25 or 0.5 MG/DOSE) 2MG /3ML pen-injectors Form [employer] Electronic PA Form (2017 NCPDP) Created 1 hour ago Sent to Plan 29 minutes ago Plan Response 29 minutes ago Submit Clinical Questions 29 minutes ago Determination Favorable 27 minutes ago Message from Plan Your request has been approved. Authorization Expiration Date: February 16, 2024.

## 2023-10-25 NOTE — Telephone Encounter (Signed)
 Referral was sent 7/17

## 2023-10-26 ENCOUNTER — Ambulatory Visit (HOSPITAL_COMMUNITY)
Admission: RE | Admit: 2023-10-26 | Discharge: 2023-10-26 | Disposition: A | Discharge status: Home / Self Care | Source: Ambulatory Visit | Attending: Internal Medicine | Admitting: Internal Medicine

## 2023-10-26 ENCOUNTER — Ambulatory Visit: Payer: Self-pay | Admitting: Student

## 2023-10-26 DIAGNOSIS — Z87891 Personal history of nicotine dependence: Secondary | ICD-10-CM | POA: Insufficient documentation

## 2023-10-26 DIAGNOSIS — R062 Wheezing: Secondary | ICD-10-CM | POA: Insufficient documentation

## 2023-10-26 LAB — PULMONARY FUNCTION TEST
DL/VA % pred: 82 %
DL/VA: 3.41 ml/min/mmHg/L
DLCO unc % pred: 78 %
DLCO unc: 15.96 ml/min/mmHg
FEF 25-75 Post: 1.5 L/s
FEF 25-75 Pre: 1.81 L/s
FEF2575-%Change-Post: -17 %
FEF2575-%Pred-Post: 69 %
FEF2575-%Pred-Pre: 84 %
FEV1-%Change-Post: -4 %
FEV1-%Pred-Post: 84 %
FEV1-%Pred-Pre: 87 %
FEV1-Post: 2.09 L
FEV1-Pre: 2.18 L
FEV1FVC-%Change-Post: 2 %
FEV1FVC-%Pred-Pre: 99 %
FEV6-%Change-Post: -4 %
FEV6-%Pred-Post: 85 %
FEV6-%Pred-Pre: 89 %
FEV6-Post: 2.67 L
FEV6-Pre: 2.81 L
FEV6FVC-%Change-Post: 1 %
FEV6FVC-%Pred-Post: 104 %
FEV6FVC-%Pred-Pre: 102 %
FVC-%Change-Post: -6 %
FVC-%Pred-Post: 82 %
FVC-%Pred-Pre: 87 %
FVC-Post: 2.67 L
FVC-Pre: 2.85 L
Post FEV1/FVC ratio: 78 %
Post FEV6/FVC ratio: 100 %
Pre FEV1/FVC ratio: 77 %
Pre FEV6/FVC Ratio: 99 %
RV % pred: 99 %
RV: 2.14 L
TLC % pred: 103 %
TLC: 5.4 L

## 2023-10-26 MED ORDER — ALBUTEROL SULFATE (2.5 MG/3ML) 0.083% IN NEBU
2.5000 mg | INHALATION_SOLUTION | Freq: Once | RESPIRATORY_TRACT | Status: AC
  Administered 2023-10-26: 2.5 mg via RESPIRATORY_TRACT

## 2023-10-27 ENCOUNTER — Other Ambulatory Visit (HOSPITAL_COMMUNITY): Payer: Self-pay

## 2023-10-27 NOTE — Progress Notes (Signed)
 Conclusions: Although there is airway obstruction and a diffusion defect suggesting emphysema, the absence of overinflation is inconsistent with that diagnosis. 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  As needed albuterol  recent

## 2023-10-28 ENCOUNTER — Encounter: Payer: Self-pay | Admitting: Dermatology

## 2023-10-28 ENCOUNTER — Ambulatory Visit: Admitting: Dermatology

## 2023-10-28 ENCOUNTER — Other Ambulatory Visit: Payer: Self-pay | Admitting: Student

## 2023-10-28 VITALS — BP 116/76 | HR 85

## 2023-10-28 DIAGNOSIS — D229 Melanocytic nevi, unspecified: Secondary | ICD-10-CM

## 2023-10-28 DIAGNOSIS — L820 Inflamed seborrheic keratosis: Secondary | ICD-10-CM

## 2023-10-28 DIAGNOSIS — D485 Neoplasm of uncertain behavior of skin: Secondary | ICD-10-CM | POA: Diagnosis not present

## 2023-10-28 MED ORDER — ATORVASTATIN CALCIUM 10 MG PO TABS: 10.0000 mg | ORAL_TABLET | Freq: Every day | ORAL | 0 refills | Status: DC

## 2023-10-28 NOTE — Patient Instructions (Addendum)
 Patient Handout: Wound Care for Skin Biopsy Site  Taking Care of Your Skin Biopsy Site  Proper care of the biopsy site is essential for promoting healing and minimizing scarring. This handout provides instructions on how to care for your biopsy site to ensure optimal recovery.  1. Cleaning the Wound:  Clean the biopsy site daily with gentle soap and water. Gently pat the area dry with a clean, soft towel. Avoid harsh scrubbing or rubbing the area, as this can irritate the skin and delay healing.  2. Applying Aquaphor and Bandage:  After cleaning the wound, apply a thin layer of Aquaphor ointment to the biopsy site. Cover the area with a sterile bandage to protect it from dirt, bacteria, and friction. Change the bandage daily or as needed if it becomes soiled or wet.  3. Continued Care for One Week:  Repeat the cleaning, Aquaphor application, and bandaging process daily for one week following the biopsy procedure. Keeping the wound clean and moist during this initial healing period will help prevent infection and promote optimal healing.  4. Massaging Aquaphor into the Area:  ---After one week, discontinue the use of bandages but continue to apply Aquaphor to the biopsy site. ----Gently massage the Aquaphor into the area using circular motions. ---Massaging the skin helps to promote circulation and prevent the formation of scar tissue.   Additional Tips:  Avoid exposing the biopsy site to direct sunlight during the healing process, as this can cause hyperpigmentation or worsen scarring. If you experience any signs of infection, such as increased redness, swelling, warmth, or drainage from the wound, contact your healthcare provider immediately. Follow any additional instructions provided by your healthcare provider for caring for the biopsy site and managing any discomfort. Conclusion:  Taking proper care of your skin biopsy site is crucial for ensuring optimal healing and  minimizing scarring. By following these instructions for cleaning, applying Aquaphor, and massaging the area, you can promote a smooth and successful recovery. If you have any questions or concerns about caring for your biopsy site, don't hesitate to contact your healthcare provider for guidance.   Important Information  Due to recent changes in healthcare laws, you may see results of your pathology and/or laboratory studies on MyChart before the doctors have had a chance to review them. We understand that in some cases there may be results that are confusing or concerning to you. Please understand that not all results are received at the same time and often the doctors may need to interpret multiple results in order to provide you with the best plan of care or course of treatment. Therefore, we ask that you please give us  2 business days to thoroughly review all your results before contacting the office for clarification. Should we see a critical lab result, you will be contacted sooner.   If You Need Anything After Your Visit  If you have any questions or concerns for your doctor, please call our main line at [phone] If no one answers, please leave a voicemail as directed and we will return your call as soon as possible. Messages left after 4 pm will be answered the following business day.   You may also send us  a message via MyChart. We typically respond to MyChart messages within 1-2 business days.  For prescription refills, please ask your pharmacy to contact our office. Our fax number is [phone].  If you have an urgent issue when the clinic is closed that cannot wait until the next business day,  you can page your doctor at the number below.    Please note that while we do our best to be available for urgent issues outside of office hours, we are not available 24/7.   If you have an urgent issue and are unable to reach us , you may choose to seek medical care at your doctor's office,  retail clinic, urgent care center, or emergency room.  If you have a medical emergency, please immediately call 911 or go to the emergency department. In the event of inclement weather, please call our main line at [phone] for an update on the status of any delays or closures.  Dermatology Medication Tips: Please keep the boxes that topical medications come in in order to help keep track of the instructions about where and how to use these. Pharmacies typically print the medication instructions only on the boxes and not directly on the medication tubes.   If your medication is too expensive, please contact our office at [phone] or send us  a message through MyChart.   We are unable to tell what your co-pay for medications will be in advance as this is different depending on your insurance coverage. However, we may be able to find a substitute medication at lower cost or fill out paperwork to get insurance to cover a needed medication.   If a prior authorization is required to get your medication covered by your insurance company, please allow us  1-2 business days to complete this process.  Drug prices often vary depending on where the prescription is filled and some pharmacies may offer cheaper prices.  The website www.goodrx.com contains coupons for medications through different pharmacies. The prices here do not account for what the cost may be with help from insurance (it may be cheaper with your insurance), but the website can give you the price if you did not use any insurance.  - You can print the associated coupon and take it with your prescription to the pharmacy.  - You may also stop by our office during regular business hours and pick up a GoodRx coupon card.  - If you need your prescription sent electronically to a different pharmacy, notify our office through [facility] or by phone at [phone]    Skin Education :   I counseled the patient regarding the  following: Sun screen (SPF 30 or greater) should be applied during peak UV exposure (between 10am and 2pm) and reapplied after exercise or swimming.  The ABCDEs of melanoma were reviewed with the patient, and the importance of monthly self-examination of moles was emphasized. Should any moles change in shape or color, or itch, bleed or burn, pt will contact our office for evaluation sooner then their interval appointment.  Plan: Sunscreen Recommendations I recommended a broad spectrum sunscreen with a SPF of 30 or higher. I explained that SPF 30 sunscreens block approximately 97 percent of the sun's harmful rays. Sunscreens should be applied at least 15 minutes prior to expected sun exposure and then every 2 hours after that as long as sun exposure continues. If swimming or exercising sunscreen should be reapplied every 45 minutes to an hour after getting wet or sweating. One ounce, or the equivalent of a shot glass full of sunscreen, is adequate to protect the skin not covered by a bathing suit. I also recommended a lip balm with a sunscreen as well. Sun protective clothing can be used in lieu of sunscreen but must be worn the entire time you are exposed  to the sun's rays. Skin Education :   I counseled the patient regarding the following: Sun screen (SPF 30 or greater) should be applied during peak UV exposure (between 10am and 2pm) and reapplied after exercise or swimming.  The ABCDEs of melanoma were reviewed with the patient, and the importance of monthly self-examination of moles was emphasized. Should any moles change in shape or color, or itch, bleed or burn, pt will contact our office for evaluation sooner then their interval appointment.  Plan: Sunscreen Recommendations I recommended a broad spectrum sunscreen with a SPF of 30 or higher. I explained that SPF 30 sunscreens block approximately 97 percent of the sun's harmful rays. Sunscreens should be applied at least 15 minutes prior to  expected sun exposure and then every 2 hours after that as long as sun exposure continues. If swimming or exercising sunscreen should be reapplied every 45 minutes to an hour after getting wet or sweating. One ounce, or the equivalent of a shot glass full of sunscreen, is adequate to protect the skin not covered by a bathing suit. I also recommended a lip balm with a sunscreen as well. Sun protective clothing can be used in lieu of sunscreen but must be worn the entire time you are exposed to the sun's rays.

## 2023-10-28 NOTE — Progress Notes (Signed)
   New Patient Visit   Subjective  Heidi Chapman is a 68 y.o. female who presents for the following: Spot of concern, lesion on left forehead, present for about 4 months, itchy at times, not  previously treated.  No hx or family hx of skin cancer  The following portions of the chart were reviewed this encounter and updated as appropriate: medications, allergies, medical history  Review of Systems:  No other skin or systemic complaints except as noted in HPI or Assessment and Plan.  Objective  Well appearing patient in no apparent distress; mood and affect are within normal limits.   A focused examination was performed of the following areas: Back, abdomen and forehead  Relevant exam findings are noted in the Assessment and Plan.  Mid Forehead 1cm pink pearly plaque    Assessment & Plan   MELANOCYTIC NEVI Exam: Tan-brown and/or pink-flesh-colored symmetric macules and papules  Treatment Plan: Benign appearing on exam today. Recommend observation. Call clinic for new or changing moles. Recommend daily use of broad spectrum spf 30+ sunscreen to sun-exposed areas.    NEOPLASM OF UNCERTAIN BEHAVIOR OF SKIN Mid Forehead Skin / nail biopsy Type of biopsy: tangential   Informed consent: discussed and consent obtained   Timeout: patient name, date of birth, surgical site, and procedure verified   Procedure prep:  Patient was prepped and draped in usual sterile fashion Prep type:  Isopropyl alcohol Anesthesia: the lesion was anesthetized in a standard fashion   Anesthetic:  1% lidocaine  w/ epinephrine  1-100,000 buffered w/ 8.4% NaHCO3 Instrument used: DermaBlade   Hemostasis achieved with: aluminum chloride   Outcome: patient tolerated procedure well   Post-procedure details: sterile dressing applied and wound care instructions given   Dressing type: petrolatum  gauze and bandage    Specimen 1 - Surgical pathology Differential Diagnosis: R/O NMSC VS OTHER  Check Margins:  No MULTIPLE BENIGN NEVI    Return in about 1 year (around 10/27/2024) for [facility]sc.  I, Berwyn Lesches, Surg Tech III, am acting as scribe for RUFUS CHRISTELLA HOLY, MD.   Documentation: I have reviewed the above documentation for accuracy and completeness, and I agree with the above.  RUFUS CHRISTELLA HOLY, MD

## 2023-10-28 NOTE — Telephone Encounter (Unsigned)
 Copied from CRM #[id number]. Topic: Clinical - Medication Refill >> Oct 28, 2023 12:27 PM Zane F wrote: Patient calling in her prescription refill. Patient has 4 days of the prescription left.   Callback Number: [id number]   Medication: atorvastatin  (LIPITOR) 10 MG tablet  Has the patient contacted their pharmacy? Yes  This is the patient's preferred pharmacy:  [id number] - [place] ([place]), [place] - [address]  ([place]) [place] [address] Phone: [phone] Fax: [phone]  Is this the correct pharmacy for this prescription? Yes   Has the prescription been filled recently? No  Is the patient out of the medication? No  Has the patient been seen for an appointment in the last year OR does the patient have an upcoming appointment? Yes  Can we respond through MyChart? Yes  Agent: Please be advised that Rx refills may take up to 3 business days. We ask that you follow-up with your pharmacy.

## 2023-10-29 ENCOUNTER — Ambulatory Visit
Admission: RE | Admit: 2023-10-29 | Discharge: 2023-10-29 | Disposition: A | Discharge status: Home / Self Care | Source: Ambulatory Visit | Attending: Internal Medicine | Admitting: Internal Medicine

## 2023-10-29 DIAGNOSIS — Z87891 Personal history of nicotine dependence: Secondary | ICD-10-CM | POA: Diagnosis not present

## 2023-10-29 LAB — SURGICAL PATHOLOGY

## 2023-11-01 ENCOUNTER — Ambulatory Visit: Payer: Self-pay | Admitting: Dermatology

## 2023-11-01 NOTE — Progress Notes (Signed)
 Office Visit Note  Patient: Heidi Chapman             Date of Birth: 1955/06/21           MRN: [id number]             PCP: Elnora Ip, MD Referring: Addie Perkins, DO Visit Date: 11/15/2023   Subjective:   Discussed the use of AI scribe software for clinical note transcription with the patient, who gave verbal consent to proceed.  History of Present Illness   Heidi Chapman is a 68 y.o. 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 14 days   She experiences increased pain and stiffness in several areas mostly has bene worse throughout her middle and upper back and in upper arms. The pain and stiffness have been persistent. She also notices increased pain and some swelling affecting fingers and wrists that is varying more between doses of Humira . she is currently three days out from her last Humira  injection. The stiffness is particularly pronounced in the mornings and exacerbated by weather changes, such as rain.  Labs in our last visit showed mild elevations in LFTs as well as sed rate. She had interval RUQ US  imaging consistent with steatosis. She attributes to prior methotrexate use. She was on a significant dose for an extended period. She is working on losing weight and improving her metabolic factors, as her blood sugar levels and A1c are returning to normal. She does not consume alcohol.  Also had CT imaging of the chest with changes consistent for emphysema and possible ILD.    11/08/23 LDCT Chest IMPRESSION: 1. Lung-RADS 1, negative. Continue annual screening with low-dose chest CT without contrast in 12 months. 2. Basilar and peripheral predominant coarsened ground-glass and scattered subpleural reticular densities, indicative of interstitial lung disease. If further evaluation is desired, high-resolution chest CT without contrast could be performed. 3. Punctate left renal stone. 4. Moderate hiatal hernia. 5. Left anterior descending  coronary artery calcification. 6.  Emphysema (ICD10-J43.9).  10/22/23 RUQ US  IMPRESSION: Diffuse increased echogenicity of the hepatic parenchyma is a nonspecific indicator of hepatocellular dysfunction, most commonly steatosis.   Previous HPI 08/12/2023 Heidi Chapman is a 68 y.o. 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 14 days.    She has been experiencing increased nausea over the past three to four months, particularly in the afternoon and evening. She attributes this to her medication regimen and is attempting to manage it by adjusting the timing and food intake with her medications. Despite these efforts, the nausea persists. No particular correlation with her injectable medication.   Over the last four weeks, she has experienced flare-ups of pain in her hands, arms, shoulders, back, knees, and ankles. She describes the pain as widespread and has been taking more pain medication than usual, which she dislikes. She associates some of the pain with fibromyalgia in addition to her rheumatoid arthritis. No changes in activity noted. Symptoms may be exacerbated by weather changes.   She reports stiffness and aching in her hands, with no numbness or tingling. Her knees are described as noisy. She experiences typical summer swelling and has varicose veins, which she does not currently treat.   She notes a raised area on her forehead, which she is concerned about due to her long-term use of Humira  and her history of sun exposure. The area is not discolored and does not consider it  urgent but prefers to have it checked due to her medication's potential side effects.       Previous HPI 05/12/2023 Heidi Chapman is a 68 y.o. 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 14 days.   She is currently on Humira , which effectively manages her joint symptoms, particularly in the hands. She experiences no morning stiffness but reports increased  stiffness and pain in the evenings, especially during cold weather. Her inflammation markers were stable at her last visit.   She takes tramadol  for pain and a muscle relaxer primarily for spinal issues, using them one to two times a day depending on the severity of her back flare-ups. Her back is the primary source of her pain rather than her hands. She describes a recent activity of planting flower seeds, which led to increased discomfort after completing about thirty pots. Her symptoms tend to flare up with cold weather, particularly before snow, and improve with warmer temperatures.   Her liver enzyme levels were slightly elevated, similar to when she was on Simponi Aria infusions two years ago.   She mentions a nodule on her finger that is bothersome but not painful, noting that a previous similar issue resolved on its own.   She has not experienced any significant illnesses in the past few months and has received her flu and pneumonia vaccinations. Her daughter and granddaughter had the flu, but she managed to avoid contracting it despite living in the same house.     Previous HPI 02/11/2023 Heidi Chapman is a 68 y.o. 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 14 days.  She presents with increased hand pain, particularly noticeable with the onset of cold weather. They describe the pain as inflammatory rather than swelling, and have increased their use of tramadol  and muscle relaxers to manage the discomfort. They deny any associated redness or swelling.   They also report experiencing dizziness, which has led to a couple of blackouts and falls. They are currently under the care of their family doctor for this issue, and are awaiting an appointment with an ENT specialist for suspected positional vertigo.   She reports no issues with the injections. Her thumb trigger finger symptoms are improved without specific intervention for this.   They describe a radiating  pain down their spine, which worsens with movement. They also report stiffness in their wrists, particularly the right one, and tenderness in their fingers.   The patient also reports popping in their knees and hips, which can sometimes be painful. They note that their joint stiffness and discomfort typically worsen around lunchtime, after they have been up and moving for a while, and continue to worsen by bedtime. They deny any morning stiffness or discomfort in their ankles or heels.    Previous HPI 11/11/2022 Heidi Chapman is a 68 y.o. 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 14 days.  No significant flareup since her last visit.  She has noticed some increase in joint pain and stiffness in her hands mostly in her shoulders for the past week associates this with the change in weather.  Otherwise feels Humira  is working pretty well notices a slight increase in morning stiffness and possibly the swelling in finger joints for a few days between doses.   Previous HPI 08/11/2022 Heidi Chapman is a 68 y.o. female here for follow up for seropositive RA  on Humira  40 mg subcu q. 14 days.  She had another interruption in treatment associated with change in insurance status so just restarted this in the past month and has taken 2 doses so far.  The interruption of about 6 weeks duration and Humira  treatment she experienced noticeable increase in hand pain and stiffness and swelling especially the several nodules on the fingers both right and left hand.  Bilateral shoulder pain also improved after resuming Humira .  Low back and hip pain has been a persistent problem so far not improved as much with resuming Humira .  This varies a lot from day-to-day sometimes more in the middle back and sometimes very low more around the low back and pelvis.  Hip pain is usually worse after driving or otherwise sitting still for a long duration.  Not having much complaint about her knees or legs  she associates this more with fibromyalgia pain which is more stress triggered and has not been a recent flareup.   Previous HPI 05/07/2022 Heidi Chapman is a 68 y.o. 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 14 days.  Since her last visit she has been on the medicine pretty consistently did have interruptus with some type of upper respiratory illness around 2 weeks ago.  She thinks this was similar to influenza but less severe.  Did not require antibiotics.  While taking the medicine without interruption sees a good benefit notices joint pain and stiffness worsening for about 3 to 4 days prior to each injection being due.  She is using a heat pack on her hands overnight also helps with the pain and stiffness.  Currently her back pain is the most limiting symptom she is following up with Dr. Lovvorn for this on treatment with Flexeril  and tramadol .  She had updated lumbar spine x-ray showing radiographic progression since 2019.  Currently working on a home exercise program for core strengthening during the past 2 weeks has not seen a significant improvement so far.   Previous HPI 02/03/22 Heidi Chapman is a 68 y.o. female here for follow up for seropositive RA on Humira  40 mg [place] q14 days. Since resuming the Humira  she notices improvement in her hand stiffness and swelling although still has some pain. Particularly nodules on the back of her joints hurt a lot with any minor bump or pressure.    Previous HPI 11/06/21 Heidi Chapman is a 68 y.o. female here for follow up for seropositive RA. She was taking Humira  40 mg [place] q14days with pretty stable symptoms but has missed the last 2 doses due to change in insurance and needing medicine pre-approval again. She has increased joint pain and stiffness especially in hands and wrists.   Previous HPI 08/06/2021 Heidi Chapman is a 68 y.o. female here for follow up for seropositive RA on Humira  40 mg [place] q14days started in  March. She has generalized pain and fatigue related to fibromyalgia syndrome and DJD of her back, followed by Dr. Lovorn currently on duloxetine  and PRN tramadol . Symptoms are doing somewhat better. She still feels daily stiffness but is not seeing much swelling and pain is decreased in elbow and hands and can tightly close fists. She has had several URI type symptoms intermittently feels like she gets sick when her granddaughter brings germs home from school, no antibiotics treatment or pneumonia.   Previous HPI 06/04/21 Heidi Chapman is a 68 y.o. female here for follow up for  seropositive RA on Humira  40 mg [place] q14days started last month. Previous MTX treatment was not resumed due to abnormal baseline LFTs. She felt improvement after about 3 days on prednisone  taper. Inflammation remains better but came back somewhat, but no large swollen nodules on the neck recently. Duloxetine  is helping her chronic pain overall but still hurts with any activity more than an hour. She followed up with her neurology for the sleep apnea and fatigue still very fatigued limiting her activity.   Previous HPI 03/13/21 Heidi Chapman is a 68 y.o. female here for seropositive rheumatoid arthritis which has been treated with methotrexate 17.5 mg [place] weekly and simponi aria infusion and taking folic acid 3 mg daily. In addition to RA she suffers pain with OA including lumbar spine DJD and fibromyalgia pain treated with naproxen 500 mg PRN and flexeril .  She reports joint pain started since many years ago particularly with progressive pain and stiffness and then lateral deviation worsening in MCP joints of both hands.  She also had chronic osteoarthritis particularly in her back with sciatica and back pain at multiple levels.  She finally went for evaluation of the hand pain and deformities since about 3 years ago was diagnosed with seropositive rheumatoid arthritis.  Initial treatments tried include leflunomide which was not  tolerated and Enbrel which did not have clinical response.  Oral methotrexate was not tolerable she had some degree of nausea with the subcutaneous methotrexate as well but not as bad.  While on treatment she felt disease control was very good mostly just had the chronic OA type symptoms not much swelling and no progression of deformities. She has been a patient with Dr. Ishmael needing to transfer care due to change in insurance status.  She is now off all of her medications since about a month ago and is noticing worsening joint pains again.  Particularly since 2 weeks ago she has much worse neck pain is developed lymph node swelling on the back of her skull and neck.  She went to the emergency department for this were no specific problems were found liver function test showed a mild elevation she was prescribed Keflex  empirically for possible infection has not noticed any difference since taking this medicine. Besides joint pain and lymph node swelling she feels extremely fatigued overall.   DMARD Hx Enbrel primary nonresponder Simponi aria TOC Methotrexate [place] TOC Methotrexate PO GI intolerance Leflunomide not tolerated   Labs reviewed 02/2021 ESR 26 CBC wnl CMP AST 43 ALT 56 Alk phos 149   03/2019 TB neg HBV neg HCV neg   01/2019 RA 109 CCP >250   10/2017 HIV neg   Review of Systems  Constitutional:  Positive for fatigue.  HENT:  Positive for mouth dryness. Negative for mouth sores.   Eyes:  Positive for dryness.  Respiratory:  Positive for shortness of breath.   Cardiovascular:  Positive for palpitations. Negative for chest pain.  Gastrointestinal:  Negative for blood in stool, constipation and diarrhea.  Endocrine: Positive for increased urination.  Genitourinary:  Negative for involuntary urination.  Musculoskeletal:  Positive for joint pain, gait problem, joint pain, joint swelling, myalgias, muscle weakness, muscle tenderness and myalgias. Negative for morning stiffness.   Skin:  Positive for sensitivity to sunlight. Negative for color change, rash and hair loss.  Allergic/Immunologic: Positive for susceptible to infections.  Neurological:  Positive for dizziness. Negative for headaches.  Hematological:  Negative for swollen glands.  Psychiatric/Behavioral:  Negative for depressed mood and sleep  disturbance. The patient is nervous/anxious.     PMFS History:  Patient Active Problem List   Diagnosis Date Noted   Wheezing 10/20/2023   Elevated liver enzymes 10/20/2023   Dizziness 02/01/2023   Normocytic anemia 08/26/2021   Fibromyalgia 06/04/2021   Depression with anxiety 05/20/2021   Chronic bilateral back pain 03/19/2021   Other idiopathic scoliosis, thoracolumbar region 03/19/2021   Lumbar radiculopathy 03/19/2021   High risk medication use 03/13/2021   Class 1 drug-induced obesity with serious comorbidity and body mass index (BMI) of 31.0 to 31.9 in adult 03/13/2021   Osteoarthritis 11/18/2020   Obstructive sleep apnea 08/29/2020   Chronic fatigue 06/28/2020   Screening mammogram for breast cancer 06/28/2020   Hyperlipidemia 06/28/2020   Type 2 diabetes mellitus with other specified complication (HCC) 06/28/2020   History of tobacco abuse 06/28/2020   Rheumatoid arthritis involving multiple sites with positive rheumatoid factor (HCC) 03/13/2019    Past Medical History:  Diagnosis Date   Absence of both cervix and uterus, acquired 12/14/2011   Acalculous cholecystitis 10/23/2017   Acquired deformity of right hand 03/13/2021   Acute left-sided low back pain without sciatica 03/13/2019   Anxiety    Anxiety 01/26/2019   Arthritis    Benign neoplasm of colon 12/14/2011   Diabetes (HCC)    Fatigue 06/28/2020   Fibromyalgia    GERD (gastroesophageal reflux disease)    Healthcare maintenance 12/06/2017   High risk medication use 03/13/2021   Lymphadenopathy 03/13/2021   Migraine    Neck pain 03/13/2021   Orthopnea 08/29/2020   Overweight  03/13/2021   Polyarthralgia 01/26/2019   Screening for colon cancer 06/28/2020   Screening mammogram for breast cancer 06/28/2020   Shortness of breath at rest 08/29/2020   Snoring 08/29/2020    Family History  Problem Relation Age of Onset   Cancer - Lung Mother    Brain cancer Mother    Colon polyps Father    Prostate cancer Father    Diverticulitis Father    Heart attack Father    Cancer Maternal Grandmother    Cancer Maternal Grandfather    Cancer Paternal Grandfather    Past Surgical History:  Procedure Laterality Date   CESAREAN SECTION     CHOLECYSTECTOMY N/A 10/24/2017   Procedure: LAPAROSCOPIC CHOLECYSTECTOMY WITH INTRAOPERATIVE CHOLANGIOGRAM;  Surgeon: Vanderbilt Ned, MD;  Location: [facility] OR;  Service: General;  Laterality: N/A;   GALLBLADDER SURGERY     TUBAL LIGATION     Social History   Social History Narrative   Lives with youngest daughter and granddaughter   Right handed   Caffeine: 1 cup of coffee and 1 soda daily   Immunization History  Administered Date(s) Administered   Influenza,inj,Quad PF,6+ Mos 01/26/2019, 11/18/2020, 02/23/2022   PFIZER(Purple Top)SARS-COV-2 Vaccination 05/13/2019, 06/07/2019, 10/30/2019   PNEUMOCOCCAL CONJUGATE-20 08/13/2022     Objective: Vital Signs: BP (!) 143/77   Pulse (!) 102   Temp 97.7 F (36.5 C)   Resp 16   Ht 5' 5 (1.651 m)   Wt 188 lb 6.4 oz (85.5 kg)   BMI 31.35 kg/m    Physical Exam Eyes:     Conjunctiva/sclera: Conjunctivae normal.  Cardiovascular:     Rate and Rhythm: Normal rate and regular rhythm.  Pulmonary:     Effort: Pulmonary effort is normal.     Breath sounds: Normal breath sounds.  Lymphadenopathy:     Cervical: No cervical adenopathy.  Skin:    General: Skin is warm and dry.  Findings: No rash.  Neurological:     Mental Status: She is alert.  Psychiatric:        Mood and Affect: Mood normal.      Musculoskeletal Exam:  Shoulders full ROM, very stiff and pain/tenderness  along anterior shoulders extending down onto elbows extensor surfaces Upper to middle back tenderness to pressure over paraspinal muscles, some symptom radiation from tender points Elbows full ROM no tenderness or swelling Wrists full ROM no tenderness or swelling Fingers full ROM no tenderness or swelling, nontender nodule on dorsal, medial side of left 2nd MCP, right 3rd PIP nontender digital cyst Knees full ROM no tenderness or swelling, bilateral patellofemoral crepitus Ankles full ROM no tenderness or swelling,   Investigation: No additional findings.  Imaging: CT CHEST LUNG CA SCREEN LOW DOSE W/O CM Result Date: 11/08/2023 CLINICAL DATA:  Former 183 pack-year smoker, quit 3 years ago. EXAM: CT CHEST WITHOUT CONTRAST LOW-DOSE FOR LUNG CANCER SCREENING TECHNIQUE: Multidetector CT imaging of the chest was performed following the standard protocol without IV contrast. RADIATION DOSE REDUCTION: This exam was performed according to the departmental dose-optimization program which includes automated exposure control, adjustment of the mA and/or kV according to patient size and/or use of iterative reconstruction technique. COMPARISON:  04/09/2022. FINDINGS: Cardiovascular: Left anterior descending coronary artery calcification. Heart size normal. No pericardial effusion. Mediastinum/Nodes: No pathologically enlarged mediastinal or axillary lymph nodes. Hilar regions are difficult to definitively evaluate without IV contrast. Esophagus is grossly unremarkable. Lungs/Pleura: Centrilobular and paraseptal emphysema. Basilar and peripheral predominant coarsened ground-glass with scattered subpleural reticular densities. No suspicious pulmonary nodules. No pleural fluid. Debris in the airway. Upper Abdomen: Cholecystectomy. Punctate left renal stone. Moderate hiatal hernia. Visualized portions of the liver, adrenal glands, kidneys, spleen, pancreas, stomach and bowel are otherwise grossly unremarkable. No upper  abdominal adenopathy. Musculoskeletal: Degenerative changes in the spine. IMPRESSION: 1. Lung-RADS 1, negative. Continue annual screening with low-dose chest CT without contrast in 12 months. 2. Basilar and peripheral predominant coarsened ground-glass and scattered subpleural reticular densities, indicative of interstitial lung disease. If further evaluation is desired, high-resolution chest CT without contrast could be performed. 3. Punctate left renal stone. 4. Moderate hiatal hernia. 5. Left anterior descending coronary artery calcification. 6.  Emphysema (ICD10-J43.9). Electronically Signed   By: Newell Eke M.D.   On: 11/08/2023 10:47   US  Abdomen Limited RUQ (LIVER/GB) Result Date: 10/22/2023 CLINICAL DATA:  Elevated liver function tests EXAM: ULTRASOUND ABDOMEN LIMITED RIGHT UPPER QUADRANT COMPARISON:  CT abdomen pelvis 10/23/2017 FINDINGS: Gallbladder: Status post cholecystectomy Common bile duct: Diameter: 7 mm Liver: Parenchymal echogenicity: Diffusely echogenic Contours: Normal Lesions: None Portal vein: Patent.  Hepatopetal flow Other: None. IMPRESSION: Diffuse increased echogenicity of the hepatic parenchyma is a nonspecific indicator of hepatocellular dysfunction, most commonly steatosis. Electronically Signed   By: Aliene Lloyd M.D.   On: 10/22/2023 16:21    Recent Labs: Lab Results  Component Value Date   WBC 5.9 08/12/2023   HGB 14.4 08/12/2023   PLT 181 08/12/2023   NA 140 08/12/2023   K 4.6 08/12/2023   CL 105 08/12/2023   CO2 27 08/12/2023   GLUCOSE 87 08/12/2023   BUN 17 08/12/2023   CREATININE 0.82 08/12/2023   BILITOT 1.2 08/12/2023   ALKPHOS 156 (H) 08/17/2021   AST 58 (H) 08/12/2023   ALT 59 (H) 08/12/2023   PROT 7.2 08/12/2023   ALBUMIN 3.1 (L) 08/17/2021   CALCIUM  9.5 08/12/2023   GFRAA 84 01/26/2019   QFTBGOLDPLUS NEGATIVE 02/11/2023  Speciality Comments: Leflunomide - intolerance; Enbrel - inadequate response; oral MTX - intolerable side effects;  SImponi Aria infusions stopped in Jan 2023 and switched to Humira  for convenience of self-injection; MTX stopped in Feb 2023 due to elevated LFTs Humira  started 04/17/21  Procedures:  No procedures performed Allergies: Azithromycin , Leflunomide, Peanut butter flavoring agent (non-screening), and Latex   Assessment / Plan:     Visit Diagnoses: Rheumatoid arthritis involving multiple sites with positive rheumatoid factor (HCC) - Plan: Sedimentation rate, adalimumab  (HUMIRA , 2 PEN,) 40 MG/0.4ML pen Increased pain and stiffness in multiple areas, with a digital mucinous cyst on the PIP joint due to joint swelling. The cyst is not causing significant symptoms. She is three days post-injection, which may influence symptom severity. - Cecking sed rate for disease monitoring - Continue Humira  40 mg [place] q14days - If labs abnormal/increased symptoms, recommend checking trough ADA levels prior to next f/u  High risk medication use - Humira  40 mg [place] q14days - Plan: CBC with Differential/Platelet, Comprehensive metabolic panel with GFR, adalimumab  (HUMIRA , 2 PEN,) 40 MG/0.4ML pen - Checking CBC and CMP for medication monitoring on continued Humira  treatment  Fibromyalgia - Flexeril  10 mg twice a day Fibromyalgia Muscle tenderness and stiffness, particularly in the upper back, shoulders, and neck. Symptoms may be exacerbated by weather changes and recent rain. Considering acupuncture, which has been beneficial in the past, to relax over-engaged muscle groups and alleviate pain. Flexeril  and tramadol  managed w/ PM&R clinic beneficial. Discussed possible adjusct treatment such as acupuncture  Liver damage likely related to prior methotrexate and/or fatty liver disease Liver damage suspected possible from methotrexate use and/or fatty liver disease. Liver enzyme levels are not severely elevated, and metabolic factors are improving monitoring for now. Weight loss and diabetes management may help reverse fatty liver  disease. Liver enzyme levels are monitored with a lower threshold for intervention due to medication history. - Monitor liver enzyme levels. - Continue weight loss efforts. - Manage diabetes to improve metabolic factors.  Hyperlipidemia Checking lipid panel for monitoring with previous elevation and RA disk factor. Last reviewed from 2024.   Orders: Orders Placed This Encounter  Procedures   Sedimentation rate   CBC with Differential/Platelet   Comprehensive metabolic panel with GFR   Lipid panel   Meds ordered this encounter  Medications   adalimumab  (HUMIRA , 2 PEN,) 40 MG/0.4ML pen    Sig: INJECT 40 MG (0.4 ML) UNDER THE SKIN EVERY 14 DAYS    Dispense:  6 each    Refill:  0    Prescription Type::   Renewal     Follow-Up Instructions: Return in about 3 months (around 02/14/2024) for RA on ADA f/u 3mos.   Lonni LELON Ester, MD  Note - This record has been created using [employer].  Chart creation errors have been sought, but may not always  have been located. Such creation errors do not reflect on  the standard of medical care.

## 2023-11-02 ENCOUNTER — Other Ambulatory Visit: Payer: Self-pay | Admitting: *Deleted

## 2023-11-02 ENCOUNTER — Other Ambulatory Visit: Payer: Self-pay | Admitting: Student

## 2023-11-02 DIAGNOSIS — M0579 Rheumatoid arthritis with rheumatoid factor of multiple sites without organ or systems involvement: Secondary | ICD-10-CM

## 2023-11-02 DIAGNOSIS — Z79899 Other long term (current) drug therapy: Secondary | ICD-10-CM

## 2023-11-02 DIAGNOSIS — E1169 Type 2 diabetes mellitus with other specified complication: Secondary | ICD-10-CM

## 2023-11-02 DIAGNOSIS — E785 Hyperlipidemia, unspecified: Secondary | ICD-10-CM

## 2023-11-02 MED ORDER — ATORVASTATIN CALCIUM 10 MG PO TABS: 10.0000 mg | ORAL_TABLET | Freq: Every day | ORAL | 3 refills | Status: AC

## 2023-11-02 NOTE — Telephone Encounter (Signed)
 Refill request received via fax from [facility] for Humira    Last Fill: 08/16/2023  Labs: 08/12/2023 Sed rate 39 is mildly elevated indicating some increase in inflammation compared to last time.  Along with the pain and stiffness in multiple areas this could indicate a flareup I will send in a short prescription for oral prednisone  she can take as needed or if the symptoms are improving on their own can just monitor for now.   Blood counts are normal.  Her kidney function is unchanged the liver enzyme tests are mildly elevated again not quite twice normal about the same as they were 6 months ago.  TB Gold: 02/11/2023 Neg   Next Visit: 11/15/2023  Last Visit: 08/12/2023  IK:Myzlfjunpi arthritis involving multiple sites with positive rheumatoid factor   Current Dose per office note 08/12/2023: Humira  40 mg subcu q. 14 days   Okay to refill Humira ?

## 2023-11-02 NOTE — Progress Notes (Signed)
 Changed medication dispense to 90 day supply.  Hadassah Ala, MD

## 2023-11-03 MED ORDER — HUMIRA (2 PEN) 40 MG/0.4ML ~~LOC~~ AJKT: AUTO-INJECTOR | SUBCUTANEOUS | 0 refills | Status: DC

## 2023-11-08 NOTE — Progress Notes (Signed)
 Pharmacy Medication Assistance Program Note    11/08/2023  Patient ID: Heidi Chapman, female   DOB: July 24, 1955, 68 y.o.   MRN: [id number]     10/22/2023  [employer] [facility]  Manufacturer [employer] Novo Nordisk  [facility] Ozempic   Type of Radiographer, therapeutic Assistance  Date Application Sent to Patient 10/22/2023  Application Items Requested Application  Date Application Sent to Prescriber 10/27/2023  Name of Prescriber Jone Dauphin  Date Application Received From Patient 10/27/2023  Application Items Received From Patient Application  Date Application Received From Provider 10/28/2023  Date Application Submitted to Manufacturer 11/03/2023  Method Application Sent to Manufacturer Fax  Patient Assistance Determination Approved  Approval Start Date 11/05/2023  Approval End Date 02/16/2024     APPROVED

## 2023-11-09 NOTE — Progress Notes (Signed)
 Negative study for lung cancer. Scan showing signs of potential fibrosis-like pattern.  Recent PFTs with mild obstructive disease. Of note, this patient has rheumatoid arthritis. Minimally symptomatic from respiratory stand point. Patient is currently on Humira . Prior treatment with methotrexate, none currently.  Plan is: To monitor potential progression of ILD, repeat PFT 6-12 months If worsening DOE without viral or bacterial process, repeat CT chest, high HD study

## 2023-11-15 ENCOUNTER — Encounter: Payer: Self-pay | Admitting: Internal Medicine

## 2023-11-15 ENCOUNTER — Ambulatory Visit: Attending: Internal Medicine | Admitting: Internal Medicine

## 2023-11-15 VITALS — BP 143/77 | HR 102 | Temp 97.7°F | Resp 16 | Ht 65.0 in | Wt 188.4 lb

## 2023-11-15 DIAGNOSIS — M0579 Rheumatoid arthritis with rheumatoid factor of multiple sites without organ or systems involvement: Secondary | ICD-10-CM | POA: Diagnosis not present

## 2023-11-15 DIAGNOSIS — Z79899 Other long term (current) drug therapy: Secondary | ICD-10-CM | POA: Diagnosis not present

## 2023-11-15 DIAGNOSIS — M797 Fibromyalgia: Secondary | ICD-10-CM

## 2023-11-15 DIAGNOSIS — R748 Abnormal levels of other serum enzymes: Secondary | ICD-10-CM | POA: Diagnosis not present

## 2023-11-15 DIAGNOSIS — E785 Hyperlipidemia, unspecified: Secondary | ICD-10-CM | POA: Diagnosis not present

## 2023-11-15 MED ORDER — HUMIRA (2 PEN) 40 MG/0.4ML ~~LOC~~ AJKT: AUTO-INJECTOR | SUBCUTANEOUS | 0 refills | Status: DC

## 2023-11-16 LAB — CBC WITH DIFFERENTIAL/PLATELET
Absolute Lymphocytes: 1723 {cells}/uL (ref 850–3900)
Absolute Monocytes: 587 {cells}/uL (ref 200–950)
Basophils Absolute: 40 {cells}/uL (ref 0–200)
Basophils Relative: 0.6 %
Eosinophils Absolute: 211 {cells}/uL (ref 15–500)
Eosinophils Relative: 3.2 %
HCT: 49.4 % — ABNORMAL HIGH (ref 35.0–45.0)
Hemoglobin: 15.9 g/dL — ABNORMAL HIGH (ref 11.7–15.5)
MCH: 29 pg (ref 27.0–33.0)
MCHC: 32.2 g/dL (ref 32.0–36.0)
MCV: 90 fL (ref 80.0–100.0)
MPV: 13.2 fL — ABNORMAL HIGH (ref 7.5–12.5)
Monocytes Relative: 8.9 %
Neutro Abs: 4039 {cells}/uL (ref 1500–7800)
Neutrophils Relative %: 61.2 %
Platelets: 212 Thousand/uL (ref 140–400)
RBC: 5.49 Million/uL — ABNORMAL HIGH (ref 3.80–5.10)
RDW: 13.5 % (ref 11.0–15.0)
Total Lymphocyte: 26.1 %
WBC: 6.6 Thousand/uL (ref 3.8–10.8)

## 2023-11-16 LAB — SEDIMENTATION RATE: Sed Rate: 33 mm/h — ABNORMAL HIGH (ref 0–30)

## 2023-11-16 LAB — COMPREHENSIVE METABOLIC PANEL WITH GFR
AG Ratio: 1.3 (calc) (ref 1.0–2.5)
ALT: 27 U/L (ref 6–29)
AST: 26 U/L (ref 10–35)
Albumin: 4.3 g/dL (ref 3.6–5.1)
Alkaline phosphatase (APISO): 194 U/L — ABNORMAL HIGH (ref 37–153)
BUN: 17 mg/dL (ref 7–25)
CO2: 23 mmol/L (ref 20–32)
Calcium: 9.8 mg/dL (ref 8.6–10.4)
Chloride: 105 mmol/L (ref 98–110)
Creat: 0.84 mg/dL (ref 0.50–1.05)
Globulin: 3.4 g/dL (ref 1.9–3.7)
Glucose, Bld: 110 mg/dL — ABNORMAL HIGH (ref 65–99)
Potassium: 4.3 mmol/L (ref 3.5–5.3)
Sodium: 140 mmol/L (ref 135–146)
Total Bilirubin: 1.1 mg/dL (ref 0.2–1.2)
Total Protein: 7.7 g/dL (ref 6.1–8.1)
eGFR: 77 mL/min/1.73m2 (ref >=60)

## 2023-11-16 LAB — LIPID PANEL
Cholesterol: 149 mg/dL (ref <200)
HDL: 70 mg/dL (ref >=50)
LDL Cholesterol (Calc): 56 mg/dL
Non-HDL Cholesterol (Calc): 79 mg/dL (ref <130)
Total CHOL/HDL Ratio: 2.1 (calc) (ref <5.0)
Triglycerides: 144 mg/dL (ref <150)

## 2023-11-19 NOTE — Telephone Encounter (Addendum)
 Pt picked up Ozempic  1 pen.

## 2023-11-22 ENCOUNTER — Encounter: Admitting: Student

## 2023-11-30 ENCOUNTER — Telehealth: Payer: Self-pay

## 2023-11-30 NOTE — Telephone Encounter (Signed)
 Received refill form for patients Ozempic .   Patient currently on Ozempic  0.25mg  once weekly (picked up/started week of 11/19/23).   Will patient be increasing to 0.5mg ?  _____________________________  Also starting January 2026, Medicare patients will no longer be eligible for assistance for Ozempic  with Novo Nordisk.   If household income is below 150% of the federal poverty level patients will need to apply for Low Income Subsidy/Extra Help with Social Security.  You may also choose to enroll in a Medicare Prescription Payment Plan through your chosen 2026 Medicare plan. This lets you spread out your prescription drug costs over the year instead of laying a large amount all at once at the pharmacy.   __________________________________  Income limits for Low Income Subsidy/Extra Help with Social Security   Individual: Annual income under [phone] (about $1950/month) Married couple: Annual income under [phone] (about $2600/month)

## 2023-12-07 NOTE — Telephone Encounter (Signed)
 Pt stated she's on the 0.25 mg of Ozempic . She had a few days of nausea which has passed. And this is the 3rd week . Stated she feels comfortable increasing the dosage after the 4th week.

## 2023-12-08 NOTE — Telephone Encounter (Signed)
 In process of completing Novo Nordisk refills for patients OZEMPIC  0.5MG  medication.  Application in Dr. Angelene box awaiting signature.

## 2023-12-15 NOTE — Telephone Encounter (Signed)
 Placed refills in Dr. Sharan box for signature

## 2023-12-16 NOTE — Progress Notes (Unsigned)
 Patient name: Heidi Chapman Date of birth: 1956-02-11 Date of visit: 12/17/23  Type of visit: Established Patient Office Visit  Subjective   Chief concern:  Chief Complaint  Patient presents with   Follow-up    46month fu visit for medication, pt states she still has SOB.     Heidi Chapman is a 68 y.o. female with a PMHx of T2DM, rheumatoid arthritis on Humira  c/b ILD, fibromyalgia, OSA, OA, class 1 obesity, HLD who presents to [facility] clinic for follow up for diabetes after starting Mounjaro .   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) 12/17/2023   Wheezing 10/20/2023   Steatosis (HCC) 10/20/2023   Dizziness 02/01/2023   Normocytic anemia 08/26/2021   Fibromyalgia 06/04/2021   Depression with anxiety 05/20/2021   Chronic bilateral back pain 03/19/2021   Other idiopathic scoliosis, thoracolumbar region 03/19/2021   Lumbar radiculopathy 03/19/2021   High risk medication use 03/13/2021   Class 1 drug-induced obesity with serious comorbidity and body mass index (BMI) of 31.0 to 31.9 in adult 03/13/2021   Osteoarthritis 11/18/2020   Obstructive sleep apnea 08/29/2020   Chronic fatigue 06/28/2020   Screening mammogram for breast cancer 06/28/2020   Hyperlipidemia 06/28/2020   Type 2 diabetes mellitus with other specified complication (HCC) 06/28/2020   History of tobacco abuse 06/28/2020   Rheumatoid arthritis involving multiple sites with positive rheumatoid factor (HCC) 03/13/2019   Healthcare maintenance 12/06/2017     Past Surgical History:  Procedure Laterality Date   CESAREAN SECTION     CHOLECYSTECTOMY N/A 10/24/2017   Procedure: LAPAROSCOPIC CHOLECYSTECTOMY WITH INTRAOPERATIVE CHOLANGIOGRAM;  Surgeon: Vanderbilt Ned, MD;  Location: [facility] OR;  Service: General;  Laterality: N/A;   GALLBLADDER SURGERY     TUBAL LIGATION      ROS negative unless otherwise indicated in the HPI or Assessment and  Plan.  Current Outpatient Medications  Medication Instructions   adalimumab  (HUMIRA , 2 PEN,) 40 MG/0.4ML pen INJECT 40 MG (0.4 ML) UNDER THE SKIN EVERY 14 DAYS   albuterol  (VENTOLIN  HFA) 108 (90 Base) MCG/ACT inhaler 2 puffs, Inhalation, Every 6 hours PRN   amantadine  (SYMMETREL ) 100 mg, Oral, Daily   atorvastatin  (LIPITOR) 10 mg, Oral, Daily   Cholecalciferol (DIALYVITE VITAMIN D  5000 PO) Take by mouth.   cimetidine (TAGAMET) 200 mg, Daily PRN   cyclobenzaprine  (FLEXERIL ) 10 mg, Oral, 3 times daily PRN   ibuprofen (ADVIL) 200 mg, As needed   Multiple Vitamins-Minerals (MULTIVITAMIN WOMEN 50+) TABS 1 tablet, Daily   Ozempic  (0.25 or 0.5 MG/DOSE) 0.5 mg, Subcutaneous, Weekly   promethazine  (PHENERGAN ) 12.5 MG tablet TAKE 1 TABLET BY MOUTH EVERY 6 HOURS AS NEEDED FOR NAUSEA FOR VOMITING   traMADol  (ULTRAM ) 50 mg, Oral, Every 6 hours PRN    Social History   Tobacco Use   Smoking status: Former    Current packs/day: 0.00    Average packs/day: 0.5 packs/day for 40.0 years (20.0 ttl pk-yrs)    Types: Cigarettes    Start date: 02/14/1981    Quit date: 02/14/2021    Years since quitting: 2.8    Passive exposure: Past   Smokeless tobacco: Never  Vaping Use   Vaping status: Never Used  Substance Use Topics   Alcohol use: Yes    Comment: 1-3 drinks annually   Drug use: Never      Objective  Today's Vitals   12/17/23 1025  BP: 128/83  Pulse: (!) 114  Temp: 98.5 F (36.9 C)  TempSrc: Oral  SpO2: 93%  Weight: 186 lb 6.4 oz (84.6 kg)  Body mass index is 31.02 kg/m.   Physical Exam: Constitutional: well-appearing, well-nourished; overweight; no acute distress HENT: normocephalic atraumatic, mucous membranes moist Eyes: conjunctiva non-erythematous Cardiovascular: slight tachycardia with regular rhythm on auscultation which is consistent with radial pulse, no m/r/g Pulmonary/Chest: normal work of breathing on room air; crackles heard on bilateral lower lung fields Abdominal:  soft, non-tender, non-distended MSK: normal bulk and tone Neurological: alert & oriented x 3, no focal deficit Skin: warm and dry Extremities: BLE without edema or erythema. Psych: normal mood and behavior  Last CBC Lab Results  Component Value Date   WBC 6.6 11/15/2023   HGB 15.9 (H) 11/15/2023   HCT 49.4 (H) 11/15/2023   MCV 90.0 11/15/2023   MCH 29.0 11/15/2023   RDW 13.5 11/15/2023   PLT 212 11/15/2023   Last metabolic panel Lab Results  Component Value Date   GLUCOSE 110 (H) 11/15/2023   NA 140 11/15/2023   K 4.3 11/15/2023   CL 105 11/15/2023   CO2 23 11/15/2023   BUN 17 11/15/2023   CREATININE 0.84 11/15/2023   EGFR 77 11/15/2023   CALCIUM  9.8 11/15/2023   PROT 7.7 11/15/2023   ALBUMIN 3.1 (L) 08/17/2021   LABGLOB 2.3 06/28/2020   AGRATIO 2.0 06/28/2020   BILITOT 1.1 11/15/2023   ALKPHOS 156 (H) 08/17/2021   AST 26 11/15/2023   ALT 27 11/15/2023   ANIONGAP 11 08/18/2021   Last hemoglobin A1c Lab Results  Component Value Date   HGBA1C 6.5 10/19/2023   Last thyroid functions Lab Results  Component Value Date   TSH 1.170 09/17/2021        Assessment & Plan  Type 2 diabetes mellitus with other specified complication, without long-term current use of insulin (HCC) Assessment & Plan: Last A1c on 10/19/2023 was 6.5.  Patient's diabetes was previously well-controlled with lifestyle changes, however the patient wanted to start GLP-1 agonist for weight loss in addition to managing diabetes.  She has been taking 0.25 mg of Ozempic  weekly.  Tolerating well, only had nausea for 2-3 days when she first started.  No adverse reactions to this medication in the last few weeks.  She has lost 2 pounds since starting Ozempic .  Has not been as active with her chair yoga due to pain from current RA/fibromyalgia flare.  She is presenting today to increase dose to 0.5 mg weekly.  UACR study due at this visit. - Increase Ozempic  from 0.25 mg weekly to 0.5 mg weekly - Urine  microalbumin/creatinine ratio pending - Return to clinic in 4 weeks for Ozempic  dose management  Orders: -     Microalbumin / creatinine urine ratio -     Ozempic  (0.25 or 0.5 MG/DOSE); Inject 0.5 mg into the skin once a week for 28 days.  Dispense: 1.5 mL; Refill: 0  Rheumatoid arthritis involving multiple sites with positive rheumatoid factor (HCC) Assessment & Plan: Follows closely with Dr. Jeannetta in rheumatology.  She receives Humira  injections every 2 weeks and was previously on methotrexate.  She is currently in a flare of her RA/fibromyalgia.  Reportedly takes tramadol , ibuprofen, muscle relaxants for her pain flares.  Heart rate is elevated at this visit and typically occurs during pain flares.  Tachycardia likely due to this as she has not had new fever, cough, increased shortness of breath from baseline, sputum production. - Close follow-up with Dr. 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  Lung-RADS 1 which is negative.  Will need to continue annual low-dose chest CT without contrast.  Ground glass and reticular densities in basilar regions of lungs which is indicative of interstitial lung disease.  This is likely due to her rheumatoid arthritis.  Exam on today's visit showing bilateral lower lobe crackles.  Heart rate is slightly tachycardic but with normal rhythm.  She was prescribed albuterol  as needed which she says is helping her dyspnea on exertion.  No changes in symptoms since last office visit.  Will continue to follow this problem and obtain spirometry in 5-11 months per Dr. Kristy note on 9/23. - Repeat CT chest with high resolution if symptoms worsen - Continue albuterol  as needed - Repeat PFTs in 5-11 months to follow progression of ILD   Fibromyalgia Assessment &  Plan: Follows with Dr. Lovorn.  States she has had increased pain and thinks she is in a fibromyalgia flare the past few weeks.  Currently takes amantadine  tramadol  and will use ibuprofen with flares.  Does chair yoga for exercise regimen. - Continue current medication regimen - Follow-up with Dr. Cornelio   Steatosis [facility])  Healthcare maintenance Assessment & Plan: Due for flu shot, patient requested to hold off on this until next office visit because she is in current pain flare.  Patient declines shingles vaccine at this time.  She had a bad reaction to past 2 vaccines and would prefer not to have this.    Return in about 4 weeks (around 01/14/2024) for ozempic  dose/healthcare maintenance.  Patient case discussed with Dr. Trudy, who also saw and evaluated the patient.  Corinthian Kemler, MD  IM  PGY-1 12/17/2023, 11:36 AM

## 2023-12-17 ENCOUNTER — Ambulatory Visit (INDEPENDENT_AMBULATORY_CARE_PROVIDER_SITE_OTHER)

## 2023-12-17 VITALS — BP 128/83 | HR 114 | Temp 98.5°F | Wt 186.4 lb

## 2023-12-17 DIAGNOSIS — Z79899 Other long term (current) drug therapy: Secondary | ICD-10-CM

## 2023-12-17 DIAGNOSIS — M797 Fibromyalgia: Secondary | ICD-10-CM

## 2023-12-17 DIAGNOSIS — M0579 Rheumatoid arthritis with rheumatoid factor of multiple sites without organ or systems involvement: Secondary | ICD-10-CM

## 2023-12-17 DIAGNOSIS — M0510 Rheumatoid lung disease with rheumatoid arthritis of unspecified site: Secondary | ICD-10-CM

## 2023-12-17 DIAGNOSIS — Z7985 Long-term (current) use of injectable non-insulin antidiabetic drugs: Secondary | ICD-10-CM | POA: Diagnosis not present

## 2023-12-17 DIAGNOSIS — Z87891 Personal history of nicotine dependence: Secondary | ICD-10-CM | POA: Diagnosis not present

## 2023-12-17 DIAGNOSIS — E1169 Type 2 diabetes mellitus with other specified complication: Secondary | ICD-10-CM | POA: Diagnosis not present

## 2023-12-17 DIAGNOSIS — Z79620 Long term (current) use of immunosuppressive biologic: Secondary | ICD-10-CM

## 2023-12-17 DIAGNOSIS — Z0000 Encounter for general adult medical examination without abnormal findings: Secondary | ICD-10-CM

## 2023-12-17 DIAGNOSIS — E119 Type 2 diabetes mellitus without complications: Secondary | ICD-10-CM

## 2023-12-17 DIAGNOSIS — E8889 Other specified metabolic disorders: Secondary | ICD-10-CM | POA: Diagnosis not present

## 2023-12-17 MED ORDER — OZEMPIC (0.25 OR 0.5 MG/DOSE) 2 MG/1.5ML ~~LOC~~ SOPN: 0.5000 mg | PEN_INJECTOR | SUBCUTANEOUS | 0 refills | Status: AC

## 2023-12-17 NOTE — Assessment & Plan Note (Addendum)
 Last A1c on 10/19/2023 was 6.5.  Patient's diabetes was previously well-controlled with lifestyle changes, however the patient wanted to start GLP-1 agonist for weight loss in addition to managing diabetes.  She has been taking 0.25 mg of Ozempic  weekly.  Tolerating well, only had nausea for 2-3 days when she first started.  No adverse reactions to this medication in the last few weeks.  She has lost 2 pounds since starting Ozempic .  Has not been as active with her chair yoga due to pain from current RA/fibromyalgia flare.  She is presenting today to increase dose to 0.5 mg weekly.  UACR study due at this visit. - Increase Ozempic  from 0.25 mg weekly to 0.5 mg weekly - Urine microalbumin/creatinine ratio pending - Return to clinic in 4 weeks for Ozempic  dose management

## 2023-12-17 NOTE — Assessment & Plan Note (Addendum)
 Follows closely with Dr. Jeannetta in rheumatology.  She receives Humira  injections every 2 weeks and was previously on methotrexate.  She is currently in a flare of her RA/fibromyalgia.  Reportedly takes tramadol , ibuprofen, muscle relaxants for her pain flares.  Heart rate is elevated at this visit and typically occurs during pain flares.  Tachycardia likely due to this as she has not had new fever, cough, increased shortness of breath from baseline, sputum production. - Close follow-up with Dr. Jeannetta every 3 months - Continue Humira  every 2 weeks

## 2023-12-17 NOTE — Assessment & Plan Note (Addendum)
 Due for flu shot, patient requested to hold off on this until next office visit because she is in current pain flare.  Patient declines shingles vaccine at this time.  She had a bad reaction to past 2 vaccines and would prefer not to have this.

## 2023-12-17 NOTE — Assessment & Plan Note (Signed)
 Follows with Dr. Lovorn.  States she has had increased pain and thinks she is in a fibromyalgia flare the past few weeks.  Currently takes amantadine  tramadol  and will use ibuprofen with flares.  Does chair yoga for exercise regimen. - Continue current medication regimen - Follow-up with Dr. Lovorn

## 2023-12-17 NOTE — Patient Instructions (Addendum)
 Thank you, Ms.Shona BIRCH Harriott for allowing us  to provide your care today. Today we discussed the following:  - I have sent a new prescription for 0.5mg  Ozempic . We will work on getting the medication at our clinic. Someone from the office should be calling you in the next week. If you do not hear from us , please call the clinic at [phone]. - Your fast heartbeat is likely from the pain you are experiencing. We do not need to repeat any chest imaging for pneumonia as you are not having any change in symptoms. - Please come back in 4 weeks to discuss Ozempic  dosing and healthcare maintenance (flu shot). - We are checking your urine studies for protein. I will call you with the results.  I have ordered the following labs for you:  Lab Orders         Microalbumin / Creatinine Urine Ratio      Tests ordered today:  None  Referrals ordered today:   Referral Orders  No referral(s) requested today     I have ordered the following medication/changed the following medications:   Stop the following medications: Medications Discontinued During This Encounter  Medication Reason   Semaglutide ,0.25 or 0.5MG /DOS, 2 MG/3ML SOPN Change in therapy     Start the following medications: Meds ordered this encounter  Medications   Semaglutide ,0.25 or 0.5MG /DOS, (OZEMPIC , 0.25 OR 0.5 MG/DOSE,) 2 MG/1.5ML SOPN    Sig: Inject 0.5 mg into the skin once a week for 28 days.    Dispense:  1.5 mL    Refill:  0     Follow up: 4 weeks    Remember: Please seek medical care/call our clinic if you experience fever, worsening shortness of breath, or chest pain.  Should you have any questions or concerns please call the Internal Medicine Clinic at [phone].     Kalli Greenfield, MD [facility]

## 2023-12-17 NOTE — Assessment & Plan Note (Signed)
 Last CT imaging of her chest was completed 10/29/2023 for lung cancer screening given patient's history of tobacco use.  Lung-RADS 1 which is negative.  Will need to continue annual low-dose chest CT without contrast.  Ground glass and reticular densities in basilar regions of lungs which is indicative of interstitial lung disease.  This is likely due to her rheumatoid arthritis.  Exam on today's visit showing bilateral lower lobe crackles.  Heart rate is slightly tachycardic but with normal rhythm.  She was prescribed albuterol  as needed which she says is helping her dyspnea on exertion.  No changes in symptoms since last office visit.  Will continue to follow this problem and obtain spirometry in 5-11 months per Dr. Kristy note on 9/23. - Repeat CT chest with high resolution if symptoms worsen - Continue albuterol  as needed - Repeat PFTs in 5-11 months to follow progression of ILD

## 2023-12-18 LAB — MICROALBUMIN / CREATININE URINE RATIO
Creatinine, Urine: 264.3 mg/dL
Microalb/Creat Ratio: 11 mg/g{creat} (ref 0–29)
Microalbumin, Urine: 28.9 ug/mL

## 2023-12-20 ENCOUNTER — Ambulatory Visit: Payer: Self-pay

## 2023-12-22 NOTE — Progress Notes (Signed)
Internal Medicine Clinic Attending  I was physically present during the key portions of the resident provided service and participated in the medical decision making of patient's management care. I reviewed pertinent patient test results.  The assessment, diagnosis, and plan were formulated together and I agree with the documentation in the resident's note.  Williams, Julie Anne, MD  

## 2023-12-22 NOTE — Telephone Encounter (Signed)
 Faxed Ozempic  0.5mg  refills to [facility]

## 2023-12-23 ENCOUNTER — Other Ambulatory Visit: Payer: Self-pay | Admitting: Student

## 2023-12-23 DIAGNOSIS — R062 Wheezing: Secondary | ICD-10-CM

## 2023-12-23 NOTE — Telephone Encounter (Signed)
 Medication sent to pharmacy

## 2024-01-07 NOTE — Telephone Encounter (Signed)
 Copied from CRM #[id number]. Topic: Clinical - Prescription Issue >> Jan 07, 2024 10:19 AM Chiquita SQUIBB wrote: Reason for CRM: Patient is calling in due to not getting her dose of Semaglutide ,0.25 or 0.5MG /DOS, (OZEMPIC , 0.25 OR 0.5 MG/DOSE,) 2 MG/1.5ML SOPN. Patient was increased to the .5MG  and has not heard from the office that it is in. Please advise patient when she can pick this up.

## 2024-01-07 NOTE — Telephone Encounter (Signed)
 Shipment arrived, will label when in office.

## 2024-01-07 NOTE — Telephone Encounter (Signed)
 Pt stated she gets Ozempic  from the patient assistance program. Informed pt it's not here; and I will ask Lavern E to f/u.

## 2024-01-10 NOTE — Telephone Encounter (Signed)
 Pt's shipment is here, called pt - no answer; left message of when she can pick it up.

## 2024-01-11 ENCOUNTER — Other Ambulatory Visit: Payer: Self-pay | Admitting: Medical Genetics

## 2024-01-12 NOTE — Telephone Encounter (Signed)
 Glenda left message for patients pickup 01/10/24.  2 boxes of Ozempic  0.5mg  dose pens.   2026 Novo Nordisk changes letter in bag.

## 2024-01-15 ENCOUNTER — Other Ambulatory Visit: Payer: Self-pay | Admitting: Student

## 2024-01-15 DIAGNOSIS — R062 Wheezing: Secondary | ICD-10-CM

## 2024-01-17 ENCOUNTER — Ambulatory Visit: Admitting: Student

## 2024-01-17 DIAGNOSIS — E1169 Type 2 diabetes mellitus with other specified complication: Secondary | ICD-10-CM

## 2024-01-17 NOTE — Telephone Encounter (Signed)
 Medication sent to pharmacy

## 2024-01-18 NOTE — Progress Notes (Signed)
 Cancelled.

## 2024-01-21 ENCOUNTER — Ambulatory Visit: Payer: Self-pay | Admitting: Student

## 2024-01-21 VITALS — BP 124/80 | HR 104 | Temp 97.6°F | Ht 65.0 in | Wt 183.6 lb

## 2024-01-21 DIAGNOSIS — Z23 Encounter for immunization: Secondary | ICD-10-CM

## 2024-01-21 DIAGNOSIS — M0579 Rheumatoid arthritis with rheumatoid factor of multiple sites without organ or systems involvement: Secondary | ICD-10-CM

## 2024-01-21 DIAGNOSIS — E1169 Type 2 diabetes mellitus with other specified complication: Secondary | ICD-10-CM | POA: Diagnosis not present

## 2024-01-21 DIAGNOSIS — Z7985 Long-term (current) use of injectable non-insulin antidiabetic drugs: Secondary | ICD-10-CM | POA: Diagnosis not present

## 2024-01-21 DIAGNOSIS — Z79620 Long term (current) use of immunosuppressive biologic: Secondary | ICD-10-CM | POA: Diagnosis not present

## 2024-01-21 DIAGNOSIS — Z87891 Personal history of nicotine dependence: Secondary | ICD-10-CM | POA: Diagnosis not present

## 2024-01-21 DIAGNOSIS — Z0000 Encounter for general adult medical examination without abnormal findings: Secondary | ICD-10-CM

## 2024-01-21 LAB — POCT GLYCOSYLATED HEMOGLOBIN (HGB A1C): HbA1c, POC (controlled diabetic range): 6.4 % (ref 0.0–7.0)

## 2024-01-21 LAB — GLUCOSE, CAPILLARY: Glucose-Capillary: 151 mg/dL — ABNORMAL HIGH (ref 70–99)

## 2024-01-21 NOTE — Progress Notes (Unsigned)
 CC: Follow-up  HPI:  [name] is a 68 y.o. female living with a history stated below and presents today for follow-up. Please see problem based assessment and plan for additional details.  Past Medical History:  Diagnosis Date   Absence of both cervix and uterus, acquired 12/14/2011   Acalculous cholecystitis 10/23/2017   Acquired deformity of right hand 03/13/2021   Acute left-sided low back pain without sciatica 03/13/2019   Anxiety    Anxiety 01/26/2019   Arthritis    Benign neoplasm of colon 12/14/2011   Diabetes (HCC)    Fatigue 06/28/2020   Fibromyalgia    GERD (gastroesophageal reflux disease)    Healthcare maintenance 12/06/2017   High risk medication use 03/13/2021   Lymphadenopathy 03/13/2021   Migraine    Neck pain 03/13/2021   Orthopnea 08/29/2020   Overweight 03/13/2021   Polyarthralgia 01/26/2019   Screening for colon cancer 06/28/2020   Screening mammogram for breast cancer 06/28/2020   Shortness of breath at rest 08/29/2020   Snoring 08/29/2020    Current Outpatient Medications on File Prior to Visit  Medication Sig Dispense Refill   adalimumab  (HUMIRA , 2 PEN,) 40 MG/0.4ML pen INJECT 40 MG (0.4 ML) UNDER THE SKIN EVERY 14 DAYS 6 each 0   albuterol  (VENTOLIN  HFA) 108 (90 Base) MCG/ACT inhaler INHALE 2 PUFFS BY MOUTH EVERY 6 HOURS AS NEEDED FOR WHEEZING FOR SHORTNESS OF BREATH 9 g 0   amantadine  (SYMMETREL ) 100 MG capsule Take 1 capsule (100 mg total) by mouth daily. 90 capsule 1   atorvastatin  (LIPITOR) 10 MG tablet Take 1 tablet (10 mg total) by mouth daily. 90 tablet 3   Cholecalciferol (DIALYVITE VITAMIN D  5000 PO) Take by mouth.     cimetidine (TAGAMET) 200 MG tablet Take 200 mg by mouth daily as needed (Acid reflux).     cyclobenzaprine  (FLEXERIL ) 10 MG tablet Take 1 tablet (10 mg total) by mouth 3 (three) times daily as needed for muscle spasms. 270 tablet 1   ibuprofen (ADVIL) 200 MG tablet Take 200 mg by mouth as needed.     Multiple  Vitamins-Minerals (MULTIVITAMIN WOMEN 50+) TABS Take 1 tablet by mouth daily.     promethazine  (PHENERGAN ) 12.5 MG tablet TAKE 1 TABLET BY MOUTH EVERY 6 HOURS AS NEEDED FOR NAUSEA FOR VOMITING (Patient taking differently: as needed. TAKE 1 TABLET BY MOUTH EVERY 6 HOURS AS NEEDED FOR NAUSEA FOR VOMITING) 270 tablet 1   traMADol  (ULTRAM ) 50 MG tablet Take 1 tablet (50 mg total) by mouth every 6 (six) hours as needed. 360 tablet 1   No current facility-administered medications on file prior to visit.    Family History  Problem Relation Age of Onset   Cancer - Lung Mother    Brain cancer Mother    Colon polyps Father    Prostate cancer Father    Diverticulitis Father    Heart attack Father    Cancer Maternal Grandmother    Cancer Maternal Grandfather    Cancer Paternal Grandfather     Social History   Socioeconomic History   Marital status: Widowed    Spouse name: Not on file   Number of children: Not on file   Years of education: Not on file   Highest education level: Not on file  Occupational History   Not on file  Tobacco Use   Smoking status: Former    Current packs/day: 0.00    Average packs/day: 0.5 packs/day for 40.0 years (20.0 ttl pk-yrs)  Types: Cigarettes    Start date: 02/14/1981    Quit date: 02/14/2021    Years since quitting: 2.9    Passive exposure: Past   Smokeless tobacco: Never  Vaping Use   Vaping status: Never Used  Substance and Sexual Activity   Alcohol use: Yes    Comment: 1-3 drinks annually   Drug use: Never   Sexual activity: Not on file  Other Topics Concern   Not on file  Social History Narrative   Lives with youngest daughter and granddaughter   Right handed   Caffeine: 1 cup of coffee and 1 soda daily   Social Drivers of Health   Financial Resource Strain: Medium Risk (10/19/2023)   Overall Financial Resource Strain (CARDIA)    Difficulty of Paying Living Expenses: Somewhat hard  Food Insecurity: No Food Insecurity (10/19/2023)    Hunger Vital Sign    Worried About Running Out of Food in the Last Year: Never true    Ran Out of Food in the Last Year: Never true  Transportation Needs: No Transportation Needs (10/19/2023)   PRAPARE - Administrator, Civil Service (Medical): No    Lack of Transportation (Non-Medical): No  Physical Activity: Insufficiently Active (10/19/2023)   Exercise Vital Sign    Days of Exercise per Week: 4 days    Minutes of Exercise per Session: 10 min  Stress: No Stress Concern Present (10/19/2023)   [employer] of Occupational Health - Occupational Stress Questionnaire    Feeling of Stress: Only a little  Social Connections: Socially Isolated (10/19/2023)   Social Connection and Isolation Panel    Frequency of Communication with Friends and Family: Twice a week    Frequency of Social Gatherings with Friends and Family: Once a week    Attends Religious Services: Never    Database Administrator or Organizations: No    Attends Banker Meetings: Never    Marital Status: Widowed  Intimate Partner Violence: Not At Risk (10/19/2023)   Humiliation, Afraid, Rape, and Kick questionnaire    Fear of Current or Ex-Partner: No    Emotionally Abused: No    Physically Abused: No    Sexually Abused: No    Review of Systems: ROS negative except for what is noted on the assessment and plan.  Vitals:   01/21/24 0851  BP: 124/80  Pulse: (!) 104  Temp: 97.6 F (36.4 C)  TempSrc: Oral  SpO2: 95%  Weight: 183 lb 9.6 oz (83.3 kg)  Height: 5' 5 (1.651 m)    Physical Exam: Constitutional: well-appearing, sitting in chair, in no acute distress Cardiovascular: regular rate and rhythm, no m/r/g Pulmonary/Chest: normal work of breathing on room air, lungs clear to auscultation bilaterally Abdominal: soft, non-tender, non-distended MSK: normal bulk and tone Skin: warm and dry Psych: normal mood and behavior  Assessment & Plan:  Type 2 diabetes mellitus with other specified  complication, without long-term current use of insulin (HCC) Assessment & Plan: Last A1c on 10/19/2023 was 6.5.  Patient's diabetes was previously well-controlled with lifestyle changes, however the patient wanted to start GLP-1 agonist for weight loss in addition to managing diabetes.  She has been taking 0.25 mg of Ozempic  weekly.  Tolerating well, only had nausea for 2-3 days when she first started.  No adverse reactions to this medication in the last few weeks.  She has lost 2 pounds since starting Ozempic .  Has not been as active with her chair yoga due to pain  from current RA/fibromyalgia flare.  She is presenting today to increase dose to 0.5 mg weekly.  UACR study due at this visit. - Increase Ozempic  from 0.25 mg weekly to 0.5 mg weekly - Urine microalbumin/creatinine ratio pending - Return to clinic in 4 weeks for Ozempic  dose management   Orders: -     Microalbumin / creatinine urine ratio -     Ozempic  (0.25 or 0.5 MG/DOSE); Inject 0.5 mg into the skin once a week for 28 days.  Dispense: 1.5 mL; Refill: 0   Rheumatoid arthritis involving multiple sites with positive rheumatoid factor (HCC) Assessment & Plan: Follows closely with Dr. Jeannetta in rheumatology.  She receives Humira  injections every 2 weeks and was previously on methotrexate.  She is currently in a flare of her RA/fibromyalgia.  Reportedly takes tramadol , ibuprofen, muscle relaxants for her pain flares.  Heart rate is elevated at this visit and typically occurs during pain flares.  Tachycardia likely due to this as she has not had new fever, cough, increased shortness of breath from baseline, sputum production. - Close follow-up with Dr. 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  Lung-RADS 1 which is negative.  Will need to  continue annual low-dose chest CT without contrast.  Ground glass and reticular densities in basilar regions of lungs which is indicative of interstitial lung disease.  This is likely due to her rheumatoid arthritis.  Exam on today's visit showing bilateral lower lobe crackles.  Heart rate is slightly tachycardic but with normal rhythm.  She was prescribed albuterol  as needed which she says is helping her dyspnea on exertion.  No changes in symptoms since last office visit.  Will continue to follow this problem and obtain spirometry in 5-11 months per Dr. Kristy note on 9/23. - Repeat CT chest with high resolution if symptoms worsen - Continue albuterol  as needed - Repeat PFTs in 5-11 months to follow progression of ILD     Fibromyalgia Assessment & Plan: Follows with Dr. Lovorn.  States she has had increased pain and thinks she is in a fibromyalgia flare the past few weeks.  Currently takes amantadine  tramadol  and will use ibuprofen with flares.  Does chair yoga for exercise regimen. - Continue current medication regimen - Follow-up with Dr. Cornelio     [facility])   Healthcare maintenance Assessment & Plan: Due for flu shot, patient requested to hold off on this until next office visit because she is in current pain flare.  Patient declines shingles vaccine at this time.  She had a bad reaction to past 2 vaccines and would prefer not to have this.       Return in about 4 weeks (around 01/14/2024) for ozempic  dose/healthcare maintenance. Patient {GC/GE:[id number]::discussed with,seen with} Dr. {[id number]::Tpoopjfd,Z. Hoffman,Lau,Machen}  No problem-specific Assessment & Plan notes found for this encounter.   Norman Lobstein, D.O. [facility], PGY-2 Date 01/21/2024 Time 9:14 AM

## 2024-01-23 NOTE — Assessment & Plan Note (Signed)
 A1c is 6.4 and has been well-controlled recently. Managed with lifestyle and recently started on Ozempic  but she has been without this as there was some confusion over where her medications were being sent. I found her Ozempic  in our fridge and gave it to her today. She sates that it is only covered if sent to this office. She will start 0.25 mg weekly for 1 month and will re-assess at follow-up to determine if dose can be increased.

## 2024-01-23 NOTE — Assessment & Plan Note (Signed)
 Follows closely with Dr. Jeannetta in rheumatology.  She receives Humira  injections every 2 weeks. Reportedly takes tramadol , ibuprofen, muscle relaxants for her pain flares.

## 2024-01-23 NOTE — Assessment & Plan Note (Signed)
 Flu vaccine today

## 2024-01-28 NOTE — Progress Notes (Signed)
 Internal Medicine Clinic Attending  Case discussed with the resident at the time of the visit.  We reviewed the resident's history and exam and pertinent patient test results.  I agree with the assessment, diagnosis, and plan of care documented in the resident's note.

## 2024-02-07 ENCOUNTER — Telehealth: Payer: Self-pay

## 2024-02-07 NOTE — Telephone Encounter (Signed)
 PA FOR CYCLOBENZAPRINE  10 MG SUBMITTED IN COVER MY MED

## 2024-02-08 NOTE — Progress Notes (Signed)
 "  Office Visit Note  Patient: Heidi Chapman             Date of Birth: Oct 31, 1955           MRN: [id number]             PCP: Elnora Ip, MD Referring: Elnora Ip, MD Visit Date: 02/22/2024   Subjective:    Discussed the use of AI scribe software for clinical note transcription with the patient, who gave verbal consent to proceed.  History of Present Illness   Heidi Chapman is a 68 y.o. 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 14 days and fibromyalgia.   She had been experiencing flare-ups in multiple areas, which she attributes to a supplement. She notes this contained she thinks St. John's Wort and several other natural derived ingredients she does not recall. After discontinuing the supplement, she noticed a significant improvement in her symptoms within two days, describing the improvement as 'almost miraculous.'  Prior to stopping the supplement, the flare-ups were continuous for three to five weeks after our last visit in September, severely affecting her ability to perform daily activities, such as getting into bed without assistance. Her daughter provided her with a set of steps to help her get into bed due to the severity of the pain.  No recent illnesses or antibiotic use. She received her flu and pneumonia vaccinations and has not had any recent steroid treatments aside from a small cycle of prednisone  previously.   Previous HPI 11/15/2023 Heidi Chapman is a 68 y.o. 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 14 days    She experiences increased pain and stiffness in several areas mostly has bene worse throughout her middle and upper back and in upper arms. The pain and stiffness have been persistent. She also notices increased pain and some swelling affecting fingers and wrists that is varying more between doses of Humira . she is currently three days out from her last Humira  injection. The  stiffness is particularly pronounced in the mornings and exacerbated by weather changes, such as rain.   Labs in our last visit showed mild elevations in LFTs as well as sed rate. She had interval RUQ US  imaging consistent with steatosis. She attributes to prior methotrexate use. She was on a significant dose for an extended period. She is working on losing weight and improving her metabolic factors, as her blood sugar levels and A1c are returning to normal. She does not consume alcohol.   Also had CT imaging of the chest with changes consistent for emphysema and possible ILD.    11/08/23 LDCT Chest IMPRESSION: 1. Lung-RADS 1, negative. Continue annual screening with low-dose chest CT without contrast in 12 months. 2. Basilar and peripheral predominant coarsened ground-glass and scattered subpleural reticular densities, indicative of interstitial lung disease. If further evaluation is desired, high-resolution chest CT without contrast could be performed. 3. Punctate left renal stone. 4. Moderate hiatal hernia. 5. Left anterior descending coronary artery calcification. 6.  Emphysema (ICD10-J43.9).   10/22/23 RUQ US  IMPRESSION: Diffuse increased echogenicity of the hepatic parenchyma is a nonspecific indicator of hepatocellular dysfunction, most commonly steatosis.     Previous HPI 08/12/2023 Heidi Chapman is a 68 y.o. 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 14 days.    She has been experiencing increased nausea over the past three to four months,  particularly in the afternoon and evening. She attributes this to her medication regimen and is attempting to manage it by adjusting the timing and food intake with her medications. Despite these efforts, the nausea persists. No particular correlation with her injectable medication.   Over the last four weeks, she has experienced flare-ups of pain in her hands, arms, shoulders, back, knees, and ankles. She  describes the pain as widespread and has been taking more pain medication than usual, which she dislikes. She associates some of the pain with fibromyalgia in addition to her rheumatoid arthritis. No changes in activity noted. Symptoms may be exacerbated by weather changes.   She reports stiffness and aching in her hands, with no numbness or tingling. Her knees are described as noisy. She experiences typical summer swelling and has varicose veins, which she does not currently treat.   She notes a raised area on her forehead, which she is concerned about due to her long-term use of Humira  and her history of sun exposure. The area is not discolored and does not consider it urgent but prefers to have it checked due to her medication's potential side effects.       Previous HPI 05/12/2023 Heidi Chapman is a 68 y.o. 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 14 days.   She is currently on Humira , which effectively manages her joint symptoms, particularly in the hands. She experiences no morning stiffness but reports increased stiffness and pain in the evenings, especially during cold weather. Her inflammation markers were stable at her last visit.   She takes tramadol  for pain and a muscle relaxer primarily for spinal issues, using them one to two times a day depending on the severity of her back flare-ups. Her back is the primary source of her pain rather than her hands. She describes a recent activity of planting flower seeds, which led to increased discomfort after completing about thirty pots. Her symptoms tend to flare up with cold weather, particularly before snow, and improve with warmer temperatures.   Her liver enzyme levels were slightly elevated, similar to when she was on Simponi Aria infusions two years ago.   She mentions a nodule on her finger that is bothersome but not painful, noting that a previous similar issue resolved on its own.   She has not  experienced any significant illnesses in the past few months and has received her flu and pneumonia vaccinations. Her daughter and granddaughter had the flu, but she managed to avoid contracting it despite living in the same house.     Previous HPI 02/11/2023 Heidi Chapman is a 68 y.o. 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 14 days.  She presents with increased hand pain, particularly noticeable with the onset of cold weather. They describe the pain as inflammatory rather than swelling, and have increased their use of tramadol  and muscle relaxers to manage the discomfort. They deny any associated redness or swelling.   They also report experiencing dizziness, which has led to a couple of blackouts and falls. They are currently under the care of their family doctor for this issue, and are awaiting an appointment with an ENT specialist for suspected positional vertigo.   She reports no issues with the injections. Her thumb trigger finger symptoms are improved without specific intervention for this.   They describe a radiating pain down their spine, which worsens with movement. They also report stiffness in  their wrists, particularly the right one, and tenderness in their fingers.   The patient also reports popping in their knees and hips, which can sometimes be painful. They note that their joint stiffness and discomfort typically worsen around lunchtime, after they have been up and moving for a while, and continue to worsen by bedtime. They deny any morning stiffness or discomfort in their ankles or heels.    Previous HPI 11/11/2022 Heidi Chapman is a 68 y.o. 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 14 days.  No significant flareup since her last visit.  She has noticed some increase in joint pain and stiffness in her hands mostly in her shoulders for the past week associates this with the change in weather.  Otherwise feels Humira  is  working pretty well notices a slight increase in morning stiffness and possibly the swelling in finger joints for a few days between doses.   Previous HPI 08/11/2022 Heidi Chapman is a 68 y.o. 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 14 days.  She had another interruption in treatment associated with change in insurance status so just restarted this in the past month and has taken 2 doses so far.  The interruption of about 6 weeks duration and Humira  treatment she experienced noticeable increase in hand pain and stiffness and swelling especially the several nodules on the fingers both right and left hand.  Bilateral shoulder pain also improved after resuming Humira .  Low back and hip pain has been a persistent problem so far not improved as much with resuming Humira .  This varies a lot from day-to-day sometimes more in the middle back and sometimes very low more around the low back and pelvis.  Hip pain is usually worse after driving or otherwise sitting still for a long duration.  Not having much complaint about her knees or legs she associates this more with fibromyalgia pain which is more stress triggered and has not been a recent flareup.   Previous HPI 05/07/2022 Heidi Chapman is a 68 y.o. 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 14 days.  Since her last visit she has been on the medicine pretty consistently did have interruptus with some type of upper respiratory illness around 2 weeks ago.  She thinks this was similar to influenza but less severe.  Did not require antibiotics.  While taking the medicine without interruption sees a good benefit notices joint pain and stiffness worsening for about 3 to 4 days prior to each injection being due.  She is using a heat pack on her hands overnight also helps with the pain and stiffness.  Currently her back pain is the most limiting symptom she is following up with Dr. Lovvorn for this on  treatment with Flexeril  and tramadol .  She had updated lumbar spine x-ray showing radiographic progression since 2019.  Currently working on a home exercise program for core strengthening during the past 2 weeks has not seen a significant improvement so far.   Previous HPI 02/03/22 Heidi Chapman is a 68 y.o. female here for follow up for seropositive RA on Humira  40 mg [place] q14 days. Since resuming the Humira  she notices improvement in her hand stiffness and swelling although still has some pain. Particularly nodules on the back of her joints hurt a lot with any minor bump or pressure.    Previous HPI 11/06/21 Heidi Chapman is a 68 y.o. female here for follow up for seropositive RA. She was taking Humira  40 mg [place] q14days with pretty stable symptoms but has missed the last 2 doses due to change in insurance and needing medicine pre-approval again. She has increased joint pain and stiffness especially in hands and wrists.   Previous HPI 08/06/2021 Heidi Chapman is a 68 y.o. 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 She has generalized pain and fatigue related to fibromyalgia syndrome and DJD of her back, followed by Dr. Lovorn currently on duloxetine  and PRN tramadol . Symptoms are doing somewhat better. She still feels daily stiffness but is not seeing much swelling and pain is decreased in elbow and hands and can tightly close fists. She has had several URI type symptoms intermittently feels like she gets sick when her granddaughter brings germs home from school, no antibiotics treatment or pneumonia.   Previous HPI 06/04/21 Heidi Chapman is a 68 y.o. 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 Previous MTX treatment was not resumed due to abnormal baseline LFTs. She felt improvement after about 3 days on prednisone  taper. Inflammation remains better but came back somewhat, but no large swollen  nodules on the neck recently. Duloxetine  is helping her chronic pain overall but still hurts with any activity more than an hour. She followed up with her neurology for the sleep apnea and fatigue still very fatigued limiting her activity.   Previous HPI 03/13/21 Larisha Vencill Jaffer is a 68 y.o. female here for seropositive rheumatoid arthritis which has been treated with methotrexate 17.5 mg [place] weekly and simponi aria infusion and taking folic acid 3 mg daily. In addition to RA she suffers pain with OA including lumbar spine DJD and fibromyalgia pain treated with naproxen 500 mg PRN and flexeril .  She reports joint pain started since many years ago particularly with progressive pain and stiffness and then lateral deviation worsening in MCP joints of both hands.  She also had chronic osteoarthritis particularly in her back with sciatica and back pain at multiple levels.  She finally went for evaluation of the hand pain and deformities since about 3 years ago was diagnosed with seropositive rheumatoid arthritis.  Initial treatments tried include leflunomide which was not tolerated and Enbrel which did not have clinical response.  Oral methotrexate was not tolerable she had some degree of nausea with the subcutaneous methotrexate as well but not as bad.  While on treatment she felt disease control was very good mostly just had the chronic OA type symptoms not much swelling and no progression of deformities. She has been a patient with Dr. Ishmael needing to transfer care due to change in insurance status.  She is now off all of her medications since about a month ago and is noticing worsening joint pains again.  Particularly since 2 weeks ago she has much worse neck pain is developed lymph node swelling on the back of her skull and neck.  She went to the emergency department for this were no specific problems were found liver function test showed a mild elevation she was prescribed Keflex  empirically for possible  infection has not noticed any difference since taking this medicine. Besides joint pain and lymph node swelling she feels extremely fatigued overall.   DMARD Hx Enbrel primary nonresponder Simponi aria TOC Methotrexate [place] TOC Methotrexate PO GI intolerance Leflunomide not tolerated  Labs reviewed 02/2021 ESR 26 CBC wnl CMP AST 43 ALT 56 Alk phos 149   03/2019 TB neg HBV neg HCV neg   01/2019 RA 109 CCP >250   10/2017 HIV neg   Review of Systems  Constitutional:  Positive for fatigue.  HENT:  Negative for mouth sores and mouth dryness.   Eyes:  Positive for dryness.  Respiratory:  Positive for shortness of breath.   Cardiovascular:  Positive for palpitations. Negative for chest pain.  Gastrointestinal:  Negative for blood in stool, constipation and diarrhea.  Endocrine: Negative for increased urination.  Genitourinary:  Negative for involuntary urination.  Musculoskeletal:  Positive for joint pain, gait problem, joint pain, joint swelling, myalgias, muscle weakness, morning stiffness, muscle tenderness and myalgias.  Skin:  Positive for sensitivity to sunlight. Negative for color change, rash and hair loss.  Allergic/Immunologic: Negative for susceptible to infections.  Neurological:  Positive for dizziness. Negative for headaches.  Hematological:  Negative for swollen glands.  Psychiatric/Behavioral:  Negative for depressed mood and sleep disturbance. The patient is nervous/anxious.     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/2023   Wheezing 10/20/2023   Steatosis (HCC) 10/20/2023   Dizziness 02/01/2023   Normocytic anemia 08/26/2021   Fibromyalgia 06/04/2021   Depression with anxiety 05/20/2021   Chronic bilateral back pain 03/19/2021   Other idiopathic scoliosis, thoracolumbar region 03/19/2021   Lumbar radiculopathy 03/19/2021   High risk medication use 03/13/2021    Class 1 drug-induced obesity with serious comorbidity and body mass index (BMI) of 31.0 to 31.9 in adult 03/13/2021   Osteoarthritis 11/18/2020   Obstructive sleep apnea 08/29/2020   Chronic fatigue 06/28/2020   Screening mammogram for breast cancer 06/28/2020   Hyperlipidemia 06/28/2020   Type 2 diabetes mellitus with other specified complication (HCC) 06/28/2020   History of tobacco abuse 06/28/2020   Rheumatoid arthritis involving multiple sites with positive rheumatoid factor (HCC) 03/13/2019   Healthcare maintenance 12/06/2017    Past Medical History:  Diagnosis Date   Absence of both cervix and uterus, acquired 12/14/2011   Acalculous cholecystitis 10/23/2017   Acquired deformity of right hand 03/13/2021   Acute left-sided low back pain without sciatica 03/13/2019   Anxiety    Anxiety 01/26/2019   Arthritis    Benign neoplasm of colon 12/14/2011   Diabetes (HCC)    Fatigue 06/28/2020   Fibromyalgia    GERD (gastroesophageal reflux disease)    Healthcare maintenance 12/06/2017   High risk medication use 03/13/2021   Lymphadenopathy 03/13/2021   Migraine    Neck pain 03/13/2021   Orthopnea 08/29/2020   Overweight 03/13/2021   Polyarthralgia 01/26/2019   Screening for colon cancer 06/28/2020   Screening mammogram for breast cancer 06/28/2020   Shortness of breath at rest 08/29/2020   Snoring 08/29/2020    Family History  Problem Relation Age of Onset   Cancer - Lung Mother    Brain cancer Mother    Colon polyps Father    Prostate cancer Father    Diverticulitis Father    Heart attack Father    Cancer Maternal Grandmother    Cancer Maternal Grandfather    Cancer Paternal Grandfather    Past Surgical History:  Procedure Laterality Date   CESAREAN SECTION     CHOLECYSTECTOMY N/A 10/24/2017   Procedure: LAPAROSCOPIC CHOLECYSTECTOMY WITH INTRAOPERATIVE CHOLANGIOGRAM;  Surgeon: Vanderbilt Ned, MD;  Location: [facility] OR;  Service: General;  Laterality: N/A;    GALLBLADDER SURGERY     TUBAL LIGATION     Social History   Social History Narrative   Lives with youngest daughter and granddaughter   Right handed   Caffeine: 1 cup of coffee and 1 soda daily   Immunization History  Administered Date(s) Administered   Fluzone Influenza virus vaccine,trivalent (IIV3), split virus 12/14/2011   INFLUENZA, HIGH DOSE SEASONAL PF 01/21/2024   Influenza,inj,Quad PF,6+ Mos 01/26/2019, 11/18/2020, 02/23/2022   PFIZER(Purple Top)SARS-COV-2 Vaccination 05/13/2019, 06/07/2019, 10/30/2019   PNEUMOCOCCAL CONJUGATE-20 08/13/2022, 01/06/2023   Zoster Recombinant(Shingrix) 01/06/2023     Objective: Vital Signs: BP 116/68   Pulse (!) 110   Temp (!) 96.6 F (35.9 C)   Resp 16   Ht 5' 5 (1.651 m)   Wt 181 lb (82.1 kg)   BMI 30.12 kg/m    Physical Exam HENT:     Mouth/Throat:     Mouth: Mucous membranes are moist.     Pharynx: Oropharynx is clear.  Eyes:     Conjunctiva/sclera: Conjunctivae normal.  Cardiovascular:     Rate and Rhythm: Normal rate and regular rhythm.  Pulmonary:     Effort: Pulmonary effort is normal.     Breath sounds: Normal breath sounds.  Musculoskeletal:     Right lower leg: No edema.     Left lower leg: No edema.  Lymphadenopathy:     Cervical: No cervical adenopathy.  Skin:    General: Skin is warm and dry.     Findings: No rash.  Neurological:     Mental Status: She is alert.  Psychiatric:        Mood and Affect: Mood normal.      Musculoskeletal Exam:  Shoulders full ROM no tenderness or swelling Right wrist slightly limited ROM compared to left with some crepitus, no effusions Fingers full ROM, nontender nodules on dorsal side 2nd MCPs b/l, DIP heberdon's nodes, no palpable synovitis Hip normal internal and external rotation without pain, no tenderness to lateral hip palpation Knees full ROM no tenderness or swelling, bony widening and crepitus Ankles full ROM no tenderness or swelling   Investigation: No  additional findings.  Imaging: No results found.  Recent Labs: Lab Results  Component Value Date   WBC 6.6 11/15/2023   HGB 15.9 (H) 11/15/2023   PLT 212 11/15/2023   NA 140 11/15/2023   K 4.3 11/15/2023   CL 105 11/15/2023   CO2 23 11/15/2023   GLUCOSE 110 (H) 11/15/2023   BUN 17 11/15/2023   CREATININE 0.84 11/15/2023   BILITOT 1.1 11/15/2023   ALKPHOS 156 (H) 08/17/2021   AST 26 11/15/2023   ALT 27 11/15/2023   PROT 7.7 11/15/2023   ALBUMIN 3.1 (L) 08/17/2021   CALCIUM  9.8 11/15/2023   GFRAA 84 01/26/2019   QFTBGOLDPLUS NEGATIVE 02/11/2023    Speciality Comments: Leflunomide - intolerance; Enbrel - inadequate response; oral MTX - intolerable side effects; SImponi Aria infusions stopped in Jan 2023 and switched to Humira  for convenience of self-injection; MTX stopped in Feb 2023 due to elevated LFTs Humira  started 04/17/21  Procedures:  No procedures performed Allergies: Azithromycin , Leflunomide, Peanut butter flavoring agent (non-screening), and Latex   Assessment / Plan:     Visit Diagnoses: Rheumatoid arthritis involving multiple sites with positive rheumatoid factor (HCC) - Plan: Sedimentation rate, adalimumab  (HUMIRA , 2 PEN,) 40 MG/0.4ML pen Flare-ups improved after discontinuing supplement. Symptoms well-controlled with Humira . Liver function tests normalized. She is no longer seeing large variance in  symptoms between doses. - Continue Humira  40mg  [place] every two weeks. - Checking ESR for disease activity monitoring today  High risk medication use - Humira  40 mg [place] q14days - Plan: CBC with Differential/Platelet, Comprehensive metabolic panel with GFR, QuantiFERON-TB Gold Plus, adalimumab  (HUMIRA , 2 PEN,) 40 MG/0.4ML pen No serious interval infections. Tolerating medication well. Previous abnormal LFTs were improved on September labs, possible related to past methotrexate. - Checking CBC and CMP for medication monitoring on Humira  - Checking annual TB  screening   Fibromyalgia Symptoms improved after discontinuing supplement. Continuing her long term care with Dr. Cornelio including flexeril , tramadol . Flare ups also doing better for FMS with her RA under better control since last visit.       Orders: Orders Placed This Encounter  Procedures   Sedimentation rate   CBC with Differential/Platelet   Comprehensive metabolic panel with GFR   QuantiFERON-TB Gold Plus   Meds ordered this encounter  Medications   adalimumab  (HUMIRA , 2 PEN,) 40 MG/0.4ML pen    Sig: INJECT 40 MG (0.4 ML) UNDER THE SKIN EVERY 14 DAYS    Dispense:  2.4 mL    Refill:  0    Prescription Type::   Renewal     Follow-Up Instructions: No follow-ups on file.   Lonni LELON Ester, MD  Note - This record has been created using [employer].  Chart creation errors have been sought, but may not always  have been located. Such creation errors do not reflect on  the standard of medical care. "

## 2024-02-09 ENCOUNTER — Other Ambulatory Visit: Payer: Self-pay | Admitting: Student

## 2024-02-09 DIAGNOSIS — R062 Wheezing: Secondary | ICD-10-CM

## 2024-02-22 ENCOUNTER — Encounter: Payer: Self-pay | Admitting: Internal Medicine

## 2024-02-22 ENCOUNTER — Telehealth: Payer: Self-pay | Admitting: Pharmacist

## 2024-02-22 ENCOUNTER — Ambulatory Visit: Attending: Internal Medicine | Admitting: Internal Medicine

## 2024-02-22 VITALS — BP 116/68 | HR 110 | Temp 96.6°F | Resp 16 | Ht 65.0 in | Wt 181.0 lb

## 2024-02-22 DIAGNOSIS — M797 Fibromyalgia: Secondary | ICD-10-CM | POA: Diagnosis not present

## 2024-02-22 DIAGNOSIS — Z79899 Other long term (current) drug therapy: Secondary | ICD-10-CM | POA: Diagnosis not present

## 2024-02-22 DIAGNOSIS — M0579 Rheumatoid arthritis with rheumatoid factor of multiple sites without organ or systems involvement: Secondary | ICD-10-CM

## 2024-02-22 MED ORDER — HUMIRA (2 PEN) 40 MG/0.4ML ~~LOC~~ AJKT: AUTO-INJECTOR | SUBCUTANEOUS | 0 refills | Status: AC

## 2024-02-22 NOTE — Telephone Encounter (Addendum)
 Submitted a Prior Authorization request to [facility] for HUMIRA  via CoverMyMeds. Will update once we receive a response.  Key: [id number]    ----- Message from CMA Francina GAILS sent at 02/22/2024 10:54 AM EST ----- Patient is needing a new PA for Humira  due to insurance changes. Please advise

## 2024-02-24 ENCOUNTER — Ambulatory Visit: Payer: Self-pay | Admitting: Internal Medicine

## 2024-02-24 LAB — COMPREHENSIVE METABOLIC PANEL WITH GFR
AG Ratio: 1.4 (calc) (ref 1.0–2.5)
ALT: 23 U/L (ref 6–29)
AST: 24 U/L (ref 10–35)
Albumin: 4.2 g/dL (ref 3.6–5.1)
Alkaline phosphatase (APISO): 198 U/L — ABNORMAL HIGH (ref 37–153)
BUN: 12 mg/dL (ref 7–25)
CO2: 23 mmol/L (ref 20–32)
Calcium: 9.6 mg/dL (ref 8.6–10.4)
Chloride: 108 mmol/L (ref 98–110)
Creat: 0.94 mg/dL (ref 0.50–1.05)
Globulin: 3 g/dL (ref 1.9–3.7)
Glucose, Bld: 119 mg/dL — ABNORMAL HIGH (ref 65–99)
Potassium: 4.2 mmol/L (ref 3.5–5.3)
Sodium: 140 mmol/L (ref 135–146)
Total Bilirubin: 1.3 mg/dL — ABNORMAL HIGH (ref 0.2–1.2)
Total Protein: 7.2 g/dL (ref 6.1–8.1)
eGFR: 67 mL/min/1.73m2

## 2024-02-24 LAB — CBC WITH DIFFERENTIAL/PLATELET
Absolute Lymphocytes: 1727 {cells}/uL (ref 850–3900)
Absolute Monocytes: 457 {cells}/uL (ref 200–950)
Basophils Absolute: 61 {cells}/uL (ref 0–200)
Basophils Relative: 1.1 %
Eosinophils Absolute: 220 {cells}/uL (ref 15–500)
Eosinophils Relative: 4 %
HCT: 46.7 % — ABNORMAL HIGH (ref 35.9–46.0)
Hemoglobin: 15.2 g/dL (ref 11.7–15.5)
MCH: 28.5 pg (ref 27.0–33.0)
MCHC: 32.5 g/dL (ref 31.6–35.4)
MCV: 87.5 fL (ref 81.4–101.7)
MPV: 13.3 fL — ABNORMAL HIGH (ref 7.5–12.5)
Monocytes Relative: 8.3 %
Neutro Abs: 3036 {cells}/uL (ref 1500–7800)
Neutrophils Relative %: 55.2 %
Platelets: 180 Thousand/uL (ref 140–400)
RBC: 5.34 Million/uL — ABNORMAL HIGH (ref 3.80–5.10)
RDW: 13.6 % (ref 11.0–15.0)
Total Lymphocyte: 31.4 %
WBC: 5.5 Thousand/uL (ref 3.8–10.8)

## 2024-02-24 LAB — QUANTIFERON-TB GOLD PLUS
Mitogen-NIL: 7.4 [IU]/mL
NIL: 0.03 [IU]/mL
QuantiFERON-TB Gold Plus: NEGATIVE
TB1-NIL: <0 [IU]/mL
TB2-NIL: 0 [IU]/mL

## 2024-02-24 LAB — SEDIMENTATION RATE: Sed Rate: 22 mm/h (ref 0–30)

## 2024-02-26 NOTE — Telephone Encounter (Signed)
 Received notification from [employer] regarding a prior authorization for HUMIRA . Authorization has been APPROVED from 02/17/2024 to 02/21/2025. Approval letter sent to scan center.  Faxed to [employer]  Takari Duncombe, PharmD, MPH, BCPS, CPP Clinical Pharmacist

## 2024-03-07 NOTE — Progress Notes (Signed)
 ISELLA SLATTEN                                          MRN: [id number]   03/07/2024   The [facility] Quality Team Specialist reviewed this patient medical record for the purposes of chart review for care gap closure. The following were reviewed: abstraction for care gap closure-kidney health evaluation for diabetes:eGFR  and uACR.    [facility]BCI Quality Team

## 2024-03-08 NOTE — Telephone Encounter (Signed)
 Patient called the office stating she needed to sit down with Devki to complete her patient assistance form for the 2026 year for her Humira . Patient advised that Devki is not in office anymore but I would have someone follow up with her. Please advise

## 2024-03-10 ENCOUNTER — Other Ambulatory Visit: Payer: Self-pay | Admitting: Student

## 2024-03-10 DIAGNOSIS — R062 Wheezing: Secondary | ICD-10-CM

## 2024-03-10 NOTE — Telephone Encounter (Signed)
 Medication sent to pharmacy

## 2024-03-21 ENCOUNTER — Telehealth: Payer: Self-pay | Admitting: *Deleted

## 2024-03-21 NOTE — Telephone Encounter (Signed)
 Copied from CRM [id number]. Topic: Appointments - Scheduling Inquiry for Clinic >> Mar 20, 2024 12:06 PM Debby BROCKS wrote: Reason for CRM: Patient has an annual wellness on 03/22/2024 that she needs to reschedule due to bad weather but there is no availability until April. Patient states that she wishes to talk meds in that appointment and she cannot wait until April and would like something within February if possible

## 2024-03-22 ENCOUNTER — Ambulatory Visit

## 2024-03-22 ENCOUNTER — Telehealth: Payer: Self-pay

## 2024-03-22 ENCOUNTER — Ambulatory Visit: Payer: Self-pay

## 2024-03-22 NOTE — Telephone Encounter (Signed)
 Reached out to pt regarding Humira  renewal application. She consented to having form sent via [employer]. Email address confirmed, application drafted and sent. Will await it's return. Provider form drafted and printed, pending signature.

## 2024-03-23 NOTE — Telephone Encounter (Signed)
 Submitted Patient Assistance Application to [employer] for [employer]IR[employer]  along with patient portion, provider portion, insurance card copy, prior authorization approval, and current medication list. Will update patient when we receive a response.  Phone: [phone] Fax: [phone]

## 2024-03-30 ENCOUNTER — Other Ambulatory Visit (HOSPITAL_COMMUNITY)

## 2024-04-10 ENCOUNTER — Ambulatory Visit: Admitting: Physical Medicine and Rehabilitation

## 2024-05-01 ENCOUNTER — Ambulatory Visit: Payer: Self-pay

## 2024-05-22 ENCOUNTER — Ambulatory Visit: Admitting: Internal Medicine

## 2024-10-31 ENCOUNTER — Ambulatory Visit: Admitting: Dermatology
